# Patient Record
Sex: Female | Born: 1975 | Race: White | Hispanic: No | Marital: Married | Smoking: Former smoker
Health system: Southern US, Community
[De-identification: ages and names within clinical notes are randomized; demographics above are authoritative.]

## PROBLEM LIST (undated history)

## (undated) DIAGNOSIS — D649 Anemia, unspecified: Secondary | ICD-10-CM

## (undated) DIAGNOSIS — G8929 Other chronic pain: Secondary | ICD-10-CM

## (undated) DIAGNOSIS — E7800 Pure hypercholesterolemia, unspecified: Secondary | ICD-10-CM

## (undated) DIAGNOSIS — O4400 Placenta previa specified as without hemorrhage, unspecified trimester: Secondary | ICD-10-CM

## (undated) DIAGNOSIS — Z87442 Personal history of urinary calculi: Secondary | ICD-10-CM

## (undated) DIAGNOSIS — R519 Headache, unspecified: Secondary | ICD-10-CM

## (undated) DIAGNOSIS — K219 Gastro-esophageal reflux disease without esophagitis: Secondary | ICD-10-CM

## (undated) DIAGNOSIS — F419 Anxiety disorder, unspecified: Secondary | ICD-10-CM

## (undated) DIAGNOSIS — R51 Headache: Secondary | ICD-10-CM

## (undated) DIAGNOSIS — G43709 Chronic migraine without aura, not intractable, without status migrainosus: Secondary | ICD-10-CM

## (undated) DIAGNOSIS — M1990 Unspecified osteoarthritis, unspecified site: Secondary | ICD-10-CM

## (undated) DIAGNOSIS — F32A Depression, unspecified: Secondary | ICD-10-CM

## (undated) DIAGNOSIS — B029 Zoster without complications: Secondary | ICD-10-CM

## (undated) DIAGNOSIS — M545 Low back pain, unspecified: Secondary | ICD-10-CM

## (undated) HISTORY — PX: BACK SURGERY: SHX140

## (undated) HISTORY — DX: Pure hypercholesterolemia, unspecified: E78.00

## (undated) HISTORY — PX: ABDOMINAL HYSTERECTOMY: SHX81

## (undated) HISTORY — PX: TUBAL LIGATION: SHX77

## (undated) HISTORY — DX: Gastro-esophageal reflux disease without esophagitis: K21.9

## (undated) HISTORY — DX: Headache: R51

## (undated) HISTORY — DX: Complete placenta previa nos or without hemorrhage, unspecified trimester: O44.00

## (undated) HISTORY — DX: Anemia, unspecified: D64.9

## (undated) HISTORY — DX: Headache, unspecified: R51.9

---

## 1998-02-05 ENCOUNTER — Other Ambulatory Visit: Admission: RE | Admit: 1998-02-05 | Discharge: 1998-02-05 | Payer: Self-pay | Admitting: Obstetrics and Gynecology

## 1999-01-26 ENCOUNTER — Other Ambulatory Visit: Admission: RE | Admit: 1999-01-26 | Discharge: 1999-01-26 | Payer: Self-pay | Admitting: Obstetrics and Gynecology

## 2000-01-14 ENCOUNTER — Other Ambulatory Visit: Admission: RE | Admit: 2000-01-14 | Discharge: 2000-01-14 | Payer: Self-pay | Admitting: *Deleted

## 2001-01-27 ENCOUNTER — Other Ambulatory Visit: Admission: RE | Admit: 2001-01-27 | Discharge: 2001-01-27 | Payer: Self-pay | Admitting: Obstetrics and Gynecology

## 2001-10-24 ENCOUNTER — Inpatient Hospital Stay (HOSPITAL_COMMUNITY): Admission: AD | Admit: 2001-10-24 | Discharge: 2001-10-26 | Payer: Self-pay | Admitting: *Deleted

## 2001-10-26 ENCOUNTER — Encounter: Payer: Self-pay | Admitting: *Deleted

## 2001-11-03 ENCOUNTER — Encounter: Payer: Self-pay | Admitting: *Deleted

## 2001-11-03 ENCOUNTER — Ambulatory Visit (HOSPITAL_COMMUNITY): Admission: RE | Admit: 2001-11-03 | Discharge: 2001-11-03 | Payer: Self-pay | Admitting: *Deleted

## 2001-11-20 ENCOUNTER — Ambulatory Visit (HOSPITAL_COMMUNITY): Admission: RE | Admit: 2001-11-20 | Discharge: 2001-11-20 | Payer: Self-pay | Admitting: *Deleted

## 2001-11-21 ENCOUNTER — Inpatient Hospital Stay (HOSPITAL_COMMUNITY): Admission: AD | Admit: 2001-11-21 | Discharge: 2001-11-25 | Payer: Self-pay | Admitting: *Deleted

## 2001-12-26 ENCOUNTER — Other Ambulatory Visit: Admission: RE | Admit: 2001-12-26 | Discharge: 2001-12-26 | Payer: Self-pay | Admitting: *Deleted

## 2002-12-28 ENCOUNTER — Other Ambulatory Visit: Admission: RE | Admit: 2002-12-28 | Discharge: 2002-12-28 | Payer: Self-pay | Admitting: *Deleted

## 2003-04-06 HISTORY — PX: CHOLECYSTECTOMY: SHX55

## 2003-06-26 ENCOUNTER — Other Ambulatory Visit: Admission: RE | Admit: 2003-06-26 | Discharge: 2003-06-26 | Payer: Self-pay | Admitting: *Deleted

## 2003-07-26 ENCOUNTER — Encounter: Admission: RE | Admit: 2003-07-26 | Discharge: 2003-07-26 | Payer: Self-pay | Admitting: *Deleted

## 2003-09-19 ENCOUNTER — Encounter: Admission: RE | Admit: 2003-09-19 | Discharge: 2003-09-19 | Payer: Self-pay | Admitting: Cardiology

## 2003-09-19 ENCOUNTER — Ambulatory Visit (HOSPITAL_COMMUNITY): Admission: RE | Admit: 2003-09-19 | Discharge: 2003-09-19 | Payer: Self-pay | Admitting: *Deleted

## 2003-10-02 ENCOUNTER — Inpatient Hospital Stay (HOSPITAL_COMMUNITY): Admission: AD | Admit: 2003-10-02 | Discharge: 2003-10-02 | Payer: Self-pay | Admitting: Obstetrics and Gynecology

## 2004-01-21 ENCOUNTER — Inpatient Hospital Stay (HOSPITAL_COMMUNITY): Admission: AD | Admit: 2004-01-21 | Discharge: 2004-01-23 | Payer: Self-pay | Admitting: Obstetrics and Gynecology

## 2004-03-10 ENCOUNTER — Observation Stay: Payer: Self-pay | Admitting: General Surgery

## 2004-03-12 ENCOUNTER — Ambulatory Visit: Payer: Self-pay | Admitting: General Surgery

## 2004-03-13 ENCOUNTER — Ambulatory Visit: Payer: Self-pay | Admitting: General Surgery

## 2005-02-18 ENCOUNTER — Ambulatory Visit: Payer: Self-pay | Admitting: Internal Medicine

## 2006-03-30 ENCOUNTER — Encounter: Admission: RE | Admit: 2006-03-30 | Discharge: 2006-03-30 | Payer: Self-pay | Admitting: *Deleted

## 2007-12-21 ENCOUNTER — Emergency Department: Payer: Self-pay | Admitting: Internal Medicine

## 2008-04-23 ENCOUNTER — Observation Stay: Payer: Self-pay | Admitting: Internal Medicine

## 2008-06-28 ENCOUNTER — Observation Stay: Payer: Self-pay | Admitting: Obstetrics and Gynecology

## 2008-07-01 ENCOUNTER — Inpatient Hospital Stay: Payer: Self-pay | Admitting: Obstetrics and Gynecology

## 2011-03-23 LAB — TSH: TSH: 0.9 u[IU]/mL (ref 0.41–5.90)

## 2011-03-23 LAB — BASIC METABOLIC PANEL
Creatinine: 0.6 mg/dL (ref 0.5–1.1)
Sodium: 136 mmol/L — AB (ref 137–147)

## 2011-04-01 ENCOUNTER — Ambulatory Visit: Payer: Self-pay | Admitting: Otolaryngology

## 2011-07-08 ENCOUNTER — Ambulatory Visit: Payer: Self-pay | Admitting: Internal Medicine

## 2011-07-08 LAB — HM MAMMOGRAPHY

## 2012-07-10 ENCOUNTER — Other Ambulatory Visit: Payer: Self-pay | Admitting: Internal Medicine

## 2012-07-10 NOTE — Telephone Encounter (Signed)
Pt listed as a [facility] pt. Per Rx states to follow up with Dr. Lorin Picket.

## 2012-09-25 ENCOUNTER — Ambulatory Visit: Payer: Self-pay | Admitting: Internal Medicine

## 2012-11-28 ENCOUNTER — Encounter: Payer: Self-pay | Admitting: Internal Medicine

## 2012-11-28 ENCOUNTER — Other Ambulatory Visit (HOSPITAL_COMMUNITY)
Admission: RE | Admit: 2012-11-28 | Discharge: 2012-11-28 | Disposition: A | Discharge status: Home / Self Care | Payer: BC Managed Care – PPO | Source: Ambulatory Visit | Attending: Pediatrics | Admitting: Pediatrics

## 2012-11-28 ENCOUNTER — Ambulatory Visit (INDEPENDENT_AMBULATORY_CARE_PROVIDER_SITE_OTHER): Payer: BC Managed Care – PPO | Admitting: Internal Medicine

## 2012-11-28 VITALS — BP 110/80 | HR 85 | Temp 98.5°F | Ht 63.0 in | Wt 174.5 lb

## 2012-11-28 DIAGNOSIS — Z124 Encounter for screening for malignant neoplasm of cervix: Secondary | ICD-10-CM

## 2012-11-28 DIAGNOSIS — K219 Gastro-esophageal reflux disease without esophagitis: Secondary | ICD-10-CM

## 2012-11-28 DIAGNOSIS — Z733 Stress, not elsewhere classified: Secondary | ICD-10-CM

## 2012-11-28 DIAGNOSIS — F439 Reaction to severe stress, unspecified: Secondary | ICD-10-CM

## 2012-11-28 DIAGNOSIS — Z1151 Encounter for screening for human papillomavirus (HPV): Secondary | ICD-10-CM | POA: Insufficient documentation

## 2012-11-28 DIAGNOSIS — R51 Headache: Secondary | ICD-10-CM

## 2012-11-28 DIAGNOSIS — E7800 Pure hypercholesterolemia, unspecified: Secondary | ICD-10-CM

## 2012-11-28 DIAGNOSIS — Z01419 Encounter for gynecological examination (general) (routine) without abnormal findings: Secondary | ICD-10-CM | POA: Insufficient documentation

## 2012-11-28 MED ORDER — ESCITALOPRAM OXALATE 10 MG PO TABS: ORAL_TABLET | ORAL | Status: DC

## 2012-11-28 MED ORDER — CYCLOBENZAPRINE HCL 5 MG PO TABS: ORAL_TABLET | ORAL | Status: DC

## 2012-12-02 ENCOUNTER — Encounter: Payer: Self-pay | Admitting: Internal Medicine

## 2012-12-03 ENCOUNTER — Encounter: Payer: Self-pay | Admitting: Internal Medicine

## 2012-12-03 ENCOUNTER — Telehealth: Payer: Self-pay | Admitting: Internal Medicine

## 2012-12-03 DIAGNOSIS — E7800 Pure hypercholesterolemia, unspecified: Secondary | ICD-10-CM | POA: Insufficient documentation

## 2012-12-03 DIAGNOSIS — K219 Gastro-esophageal reflux disease without esophagitis: Secondary | ICD-10-CM | POA: Insufficient documentation

## 2012-12-03 DIAGNOSIS — R519 Headache, unspecified: Secondary | ICD-10-CM | POA: Insufficient documentation

## 2012-12-03 DIAGNOSIS — F411 Generalized anxiety disorder: Secondary | ICD-10-CM | POA: Insufficient documentation

## 2012-12-03 NOTE — Assessment & Plan Note (Signed)
On lexapro.  Discussed with her at length today.  She feels she is handling things relatively well.  Follow.

## 2012-12-03 NOTE — Assessment & Plan Note (Signed)
Low cholesterol diet and exercise.  Check lipid panel.   

## 2012-12-03 NOTE — Assessment & Plan Note (Signed)
Symptoms controlled.  Follow.   

## 2012-12-03 NOTE — Progress Notes (Signed)
Subjective:    Patient ID: Ariel Morgan, female    DOB: 1975/11/22, 37 y.o.   MRN: [id number]  HPI 37 year old female with past history of hypercholesterolemia who comes in today to follow up on this as well as for a complete physical exam.  She is also transferring her care here to [facility].  Former pt of mine at [facility].  States she is doing relatively well.  Is a Runner, broadcasting/film/video.  Just started back to school.  Increased stress related to this.  Feels she is handling things relatively well.  On Lexapro.  Does report problems with headaches (intermittently).  This has been an issue for her in the past.  Relates it to increased stress.  Eating and drinking well.  No cardiac symptoms with increased activity or exertion.  Bowels stable.     Past Medical History  Diagnosis Date  . Hypercholesterolemia   . Placenta previa   . GERD (gastroesophageal reflux disease)   . Frequent headaches     H/O  . Anemia     Outpatient Encounter Prescriptions as of 11/28/2012  Medication Sig Dispense Refill  . [DISCONTINUED] escitalopram (LEXAPRO) 10 MG tablet 1 1/2 TABLET BY MOUTH ONCE A DAY FOLLOW UP WITH DR Lorin Picket AT [place]  45 tablet  2  . cyclobenzaprine (FLEXERIL) 5 MG tablet Take one tablet q pm prn  30 tablet  0  . escitalopram (LEXAPRO) 10 MG tablet 1 1/2 tablet day  45 tablet  5  . [DISCONTINUED] butalbital-acetaminophen-caffeine (FIORICET, ESGIC) 50-325-40 MG per tablet Take 1 tablet by mouth every 6 (six) hours as needed for headache.      . [DISCONTINUED] promethazine (PHENERGAN) 25 MG tablet Take 25 mg by mouth every 6 (six) hours as needed.       No facility-administered encounter medications on file as of 11/28/2012.    Review of Systems Patient denies any lightheadedness or dizziness.  Headaches as outlined.  No chest pain, tightness or palpitations.  No increased shortness of breath, cough or congestion.  No nausea or vomiting.  No increased acid reflux.  No abdominal pain or cramping.  No  bowel change, such as diarrhea, constipation, BRBPR or melana.  No urine change.   Increased stress.  Feels she is handling things relatively well.       Objective:   Physical Exam Filed Vitals:   11/28/12 1553  BP: 110/80  Pulse: 85  Temp: 98.5 F (71.52 C)   37 year old female in no acute distress.   HEENT:  Nares- clear.  Oropharynx - without lesions. NECK:  Supple.  Nontender.  No audible bruit.  HEART:  Appears to be regular. LUNGS:  No crackles or wheezing audible.  Respirations even and unlabored.  RADIAL PULSE:  Equal bilaterally.    BREASTS:  No nipple discharge or nipple retraction present.  Could not appreciate any distinct nodules or axillary adenopathy.  ABDOMEN:  Soft, nontender.  Bowel sounds present and normal.  No audible abdominal bruit.  GU:  Normal external genitalia.  Vaginal vault without lesions.  Cervix identified.  Pap performed. Could not appreciate any adnexal masses or tenderness.   RECTAL:  Not performed.   EXTREMITIES:  No increased edema present.  DP pulses palpable and equal bilaterally.          Assessment & Plan:  HEALTH MAINTENANCE.  Physical today.  Needs baseline mammogram.  Obtain records.    I spent 25 minutes with this patient and more than 50%  of the time was spent in consultation regarding the above.

## 2012-12-03 NOTE — Assessment & Plan Note (Signed)
She relates the headaches to stress.  Has experienced some headaches in the past.  Flexeril as directed.  Follow.

## 2012-12-03 NOTE — Telephone Encounter (Signed)
Please schedule pt for a follow up appt in 6 months ( ).  Schedule fasting labs within the next 1-2 weeks.  Thanks.

## 2012-12-05 NOTE — Telephone Encounter (Signed)
Left message on home answering machine asking pt to call office

## 2012-12-06 NOTE — Telephone Encounter (Signed)
Lab appointment 9/16  Follow up with dr scott 3/3  Pt aware

## 2012-12-11 ENCOUNTER — Encounter: Payer: Self-pay | Admitting: Internal Medicine

## 2012-12-19 ENCOUNTER — Other Ambulatory Visit: Payer: BC Managed Care – PPO

## 2012-12-25 ENCOUNTER — Telehealth: Payer: Self-pay | Admitting: *Deleted

## 2012-12-25 NOTE — Telephone Encounter (Signed)
She needs to be evaluated if ongoing.  If increased for 3 days - I would have her go to acute care this pm.

## 2012-12-25 NOTE — Telephone Encounter (Signed)
Pt notified and verbalized understanding.

## 2012-12-25 NOTE — Telephone Encounter (Signed)
Pt C/O migraine since Saturday. Muscle Relaxer, Ibuprofen, & Excedrin Migraine not helping. Headache has been ongoing x 3 days. Please advise

## 2013-01-29 ENCOUNTER — Encounter: Payer: Self-pay | Admitting: Internal Medicine

## 2013-01-29 ENCOUNTER — Other Ambulatory Visit (INDEPENDENT_AMBULATORY_CARE_PROVIDER_SITE_OTHER): Payer: BC Managed Care – PPO

## 2013-01-29 DIAGNOSIS — E7800 Pure hypercholesterolemia, unspecified: Secondary | ICD-10-CM

## 2013-01-29 DIAGNOSIS — R51 Headache: Secondary | ICD-10-CM

## 2013-01-29 LAB — LIPID PANEL: Triglycerides: 114 mg/dL (ref 0.0–149.0)

## 2013-01-29 LAB — CBC WITH DIFFERENTIAL/PLATELET
Basophils Relative: 1.2 % (ref 0.0–3.0)
HCT: 39 % (ref 36.0–46.0)
Hemoglobin: 13.3 g/dL (ref 12.0–15.0)
Lymphocytes Relative: 23.5 % (ref 12.0–46.0)
MCV: 88.9 fl (ref 78.0–100.0)
Neutrophils Relative %: 68.3 % (ref 43.0–77.0)
Platelets: 328 10*3/uL (ref 150.0–400.0)
RBC: 4.39 Mil/uL (ref 3.87–5.11)
WBC: 7.7 10*3/uL (ref 4.5–10.5)

## 2013-01-29 LAB — COMPREHENSIVE METABOLIC PANEL
ALT: 31 U/L (ref 0–35)
AST: 28 U/L (ref 0–37)
BUN: 9 mg/dL (ref 6–23)
CO2: 24 mEq/L (ref 19–32)
Creatinine, Ser: 0.8 mg/dL (ref 0.4–1.2)
GFR: 92.4 mL/min (ref >60.00)

## 2013-01-29 LAB — LDL CHOLESTEROL, DIRECT: Direct LDL: 132.6 mg/dL

## 2013-02-08 ENCOUNTER — Other Ambulatory Visit: Payer: Self-pay

## 2013-03-16 ENCOUNTER — Telehealth: Payer: Self-pay | Admitting: *Deleted

## 2013-03-16 NOTE — Telephone Encounter (Signed)
She will need to be seen.  I can see her on 03/22/13 at 4:15.

## 2013-03-16 NOTE — Telephone Encounter (Signed)
Appointment made pt aware

## 2013-03-16 NOTE — Telephone Encounter (Signed)
Was seen at [facility] walk-in about 1 month ago & given caffeine pills for her migraines/headaches & worked very well. Wants to know if you would send in a RX. Pt states that they seem to work better than the muscle relaxer. Pt also mentioned that she has had several deaths in her family recently & wanted to know if she could have a Rx for Xanax. I informed patient that she would probably need to be seen first. Please advise.

## 2013-03-22 ENCOUNTER — Ambulatory Visit: Payer: Self-pay | Admitting: Internal Medicine

## 2013-03-22 ENCOUNTER — Ambulatory Visit (INDEPENDENT_AMBULATORY_CARE_PROVIDER_SITE_OTHER): Payer: BC Managed Care – PPO | Admitting: Internal Medicine

## 2013-03-22 ENCOUNTER — Encounter: Payer: Self-pay | Admitting: Internal Medicine

## 2013-03-22 VITALS — BP 100/62 | HR 80 | Temp 98.1°F | Resp 12 | Ht 63.0 in | Wt 176.5 lb

## 2013-03-22 DIAGNOSIS — F439 Reaction to severe stress, unspecified: Secondary | ICD-10-CM

## 2013-03-22 DIAGNOSIS — M79671 Pain in right foot: Secondary | ICD-10-CM

## 2013-03-22 DIAGNOSIS — Z733 Stress, not elsewhere classified: Secondary | ICD-10-CM

## 2013-03-22 DIAGNOSIS — R51 Headache: Secondary | ICD-10-CM

## 2013-03-22 DIAGNOSIS — M79609 Pain in unspecified limb: Secondary | ICD-10-CM

## 2013-03-22 DIAGNOSIS — K219 Gastro-esophageal reflux disease without esophagitis: Secondary | ICD-10-CM

## 2013-03-22 MED ORDER — ETODOLAC 400 MG PO TABS: 400.0000 mg | ORAL_TABLET | Freq: Two times a day (BID) | ORAL | Status: DC | PRN

## 2013-03-22 MED ORDER — ALPRAZOLAM 0.25 MG PO TABS: 0.2500 mg | ORAL_TABLET | Freq: Every day | ORAL | Status: DC | PRN

## 2013-03-22 MED ORDER — BUTALBITAL-APAP-CAFFEINE 50-325-40 MG PO TABS: 1.0000 | ORAL_TABLET | Freq: Two times a day (BID) | ORAL | Status: DC | PRN

## 2013-03-22 MED ORDER — ESCITALOPRAM OXALATE 20 MG PO TABS: 20.0000 mg | ORAL_TABLET | Freq: Every day | ORAL | Status: DC

## 2013-03-22 NOTE — Progress Notes (Signed)
Pre visit review using our clinic review tool, if applicable. No additional management support is needed unless otherwise documented below in the visit note. 

## 2013-03-23 ENCOUNTER — Telehealth: Payer: Self-pay | Admitting: *Deleted

## 2013-03-23 ENCOUNTER — Encounter: Payer: Self-pay | Admitting: *Deleted

## 2013-03-23 DIAGNOSIS — M79673 Pain in unspecified foot: Secondary | ICD-10-CM

## 2013-03-23 NOTE — Telephone Encounter (Signed)
Sent mychart message with foot x-ray results: Your foot x-ray was negative. Dr. Lorin Picket would like for you to remain from bearing weight on that foot as much as possible. Recommends ankle/foot support. Given persistent pain/swelling, Dr. Lorin Picket would like to refer you to podiatry. If you are agreeable, please let me know so that she can place the order.    Pt notified & would like to proceed with Podiatry referral (copy of report placed in [facility] box)

## 2013-03-23 NOTE — Telephone Encounter (Signed)
Order placed for podiatry referral.   

## 2013-03-25 ENCOUNTER — Encounter: Payer: Self-pay | Admitting: Internal Medicine

## 2013-03-25 DIAGNOSIS — M79671 Pain in right foot: Secondary | ICD-10-CM | POA: Insufficient documentation

## 2013-03-25 NOTE — Assessment & Plan Note (Addendum)
On lexapro.  Discussed with her at length today.  Increased stress. Her grandmother passed away 2013-02-02 and her grandfather passed away 21-Feb-2013.   She feels is contributing to her headaches.  Xanax relieved.  Will increase lexapro to 20mg  q day.  Xanax rx given to have if needed.  Follow.  Get her back in soon to reassess.

## 2013-03-25 NOTE — Assessment & Plan Note (Signed)
She relates the headaches to stress.  Has experienced some headaches in the past.  Request a refill on fioricet.  Refilled.  Will treat the stress as outlined.  Follow closely.  If persistent headaches, will require further evaluation and testing (including possible repeat scanning).  She desires to hold off on this at this time.  Follow.  Knows if any worsening change or problems she is to be reevaluated.

## 2013-03-25 NOTE — Assessment & Plan Note (Signed)
Symptoms controlled.  Follow.   

## 2013-03-25 NOTE — Assessment & Plan Note (Signed)
Persistent pain and swelling in her right foot s/p injury.  Will xray.  Continue support.  Remain non weight bearing as much as possible.  Further w/up pending results of xray.  May need podiatry referral.  Lodine for pain.  Follow GI side effects.

## 2013-03-25 NOTE — Progress Notes (Signed)
  Subjective:    Patient ID: Ariel Morgan, female    DOB: [birth year], 36 y.o.   MRN: [id number]  Headache   Foot Pain Associated symptoms include headaches.   37 year old female with past history of hypercholesterolemia who comes in today as a work in with concerns regarding persistent headaches.  Feels related to stress.  Took a xanax on one occasion and the headache immediately resolved.  She is on Lexapro.  Discussed increasing the dose.  She has previously had fioricet for her headaches.  Just took prn.  Felt this worked better than the muscle relaxer.   Eating and drinking well.  No cardiac symptoms with increased activity or exertion.  Bowels stable.  She also reports increased right foot pain and swelling s/p injury.  Larey Seat down her step three days ago.  Has been wearing a laced ankle support.  Persistent pain.  States her foot "twisted both ways".     Past Medical History  Diagnosis Date  . Hypercholesterolemia   . Placenta previa   . GERD (gastroesophageal reflux disease)   . Frequent headaches     H/O  . Anemia     Outpatient Encounter Prescriptions as of 03/22/2013  Medication Sig  . magnesium oxide (MAG-OX) 400 MG tablet Take 400 mg by mouth daily.  . [DISCONTINUED] escitalopram (LEXAPRO) 10 MG tablet 1 1/2 tablet day  . ALPRAZolam (XANAX) 0.25 MG tablet Take 1 tablet (0.25 mg total) by mouth daily as needed for anxiety.  . butalbital-acetaminophen-caffeine (FIORICET) 50-325-40 MG per tablet Take 1 tablet by mouth 2 (two) times daily as needed for headache.  . cyclobenzaprine (FLEXERIL) 5 MG tablet Take one tablet q pm prn  . escitalopram (LEXAPRO) 20 MG tablet Take 1 tablet (20 mg total) by mouth daily.  Marland Kitchen etodolac (LODINE) 400 MG tablet Take 1 tablet (400 mg total) by mouth 2 (two) times daily as needed. Take with food    Review of Systems  Neurological: Positive for headaches.  Patient denies any lightheadedness or dizziness.  Headaches as outlined.  No chest  pain, tightness or palpitations.  No increased shortness of breath, cough or congestion.  No nausea or vomiting.  No increased acid reflux.  No abdominal pain or cramping.  No bowel change, such as diarrhea, constipation, BRBPR or melana.  No urine change.   Increased stress.  Feels she is handling things relatively well.       Objective:   Physical Exam  Filed Vitals:   03/22/13 1630  BP: 100/62  Pulse: 80  Temp: 98.1 F (36.7 C)  Resp: 12   Blood pressure recheck:  34/60  37 year old female in no acute distress.   HEENT:  Nares- clear.  Oropharynx - without lesions. Eyes:  PERRL.  Fundi visualized appeared to be benign.  NECK:  Supple.  Nontender.  No audible bruit.  HEART:  Appears to be regular. LUNGS:  No crackles or wheezing audible.  Respirations even and unlabored.  RADIAL PULSE:  Equal bilaterally.     ABDOMEN:  Soft, nontender.  Bowel sounds present and normal.  No audible abdominal bruit.   EXTREMITIES:  No increased edema present.  DP pulses palpable and equal bilaterally.          Assessment & Plan:  HEALTH MAINTENANCE.  Physical 11/28/12.     I spent 25 minutes with this patient and more than 50% of the time was spent in consultation regarding the above.

## 2013-03-27 ENCOUNTER — Encounter: Payer: Self-pay | Admitting: Podiatry

## 2013-03-27 ENCOUNTER — Ambulatory Visit (INDEPENDENT_AMBULATORY_CARE_PROVIDER_SITE_OTHER): Payer: BC Managed Care – PPO

## 2013-03-27 ENCOUNTER — Encounter: Payer: Self-pay | Admitting: Internal Medicine

## 2013-03-27 ENCOUNTER — Ambulatory Visit (INDEPENDENT_AMBULATORY_CARE_PROVIDER_SITE_OTHER): Payer: BC Managed Care – PPO | Admitting: Podiatry

## 2013-03-27 VITALS — BP 102/64 | HR 78 | Resp 16 | Ht 63.0 in | Wt 174.0 lb

## 2013-03-27 DIAGNOSIS — M79671 Pain in right foot: Secondary | ICD-10-CM

## 2013-03-27 DIAGNOSIS — M79609 Pain in unspecified limb: Secondary | ICD-10-CM

## 2013-03-27 DIAGNOSIS — S93409A Sprain of unspecified ligament of unspecified ankle, initial encounter: Secondary | ICD-10-CM

## 2013-03-27 DIAGNOSIS — M7750 Other enthesopathy of unspecified foot: Secondary | ICD-10-CM

## 2013-03-27 MED ORDER — TRIAMCINOLONE ACETONIDE 10 MG/ML IJ SUSP
10.0000 mg | Freq: Once | INTRAMUSCULAR | Status: AC
  Administered 2013-03-27: 10 mg

## 2013-03-27 NOTE — Telephone Encounter (Signed)
Called pt and discussed with her regarding her spotting.  Minimal.   Will follow.  Had cortisone injection today.  May be able to get off antiinflammatory soon.  Will keep me posted.

## 2013-03-27 NOTE — Progress Notes (Signed)
   Subjective:    Patient ID: Ariel Morgan, female    DOB: June 24, 1975, 37 y.o.   MRN: [id number]  HPI Comments: Rolled foot about a week ago Monday  N painful , swollen  L right lateral side of foot  D rolled foot 1 week ago  Oall of a sudden  C about the same  A walk for long period of time it starts to hurt  T xray, pain meds    Foot Pain      Review of Systems  All other systems reviewed and are negative.       Objective:   Physical Exam        Assessment & Plan:

## 2013-03-27 NOTE — Progress Notes (Signed)
Subjective:     Patient ID: Ariel Morgan, female   DOB: 1976/01/01, 37 y.o.   MRN: [id number]  Foot Pain   patient presents stating she rolled her foot several weeks ago and has been very tender when pressed on the outside and it no longer hurts in the ankle joint but more distal and she feels like it is not supported properly. Didn't have x-rays done which did not indicate fracture at the time   Review of Systems  All other systems reviewed and are negative.       Objective:   Physical Exam  Nursing note and vitals reviewed. Constitutional: She is oriented to person, place, and time.  Cardiovascular: Intact distal pulses.   Musculoskeletal: Normal range of motion.  Neurological: She is oriented to person, place, and time.  Skin: Skin is warm.   neurovascular status intact with muscle strength adequate and some splinting when I inverted and everted the ankle joint but no indications of ligamentous tear or laxity. Pain distal to the ankle on the lateral side of the foot with inflammation in this area noted     Assessment:     Mild instability of the right ankles secondary to sprain and distal tendinitis secondary to injury    Plan:     H&P reviewed with patient as were x-rays. I did a careful distal injection 3 mg Kenalog 5 mm Xylocaine Marcaine mixture and dispensed a Tri-Lock ankle brace to stabilize the ankle. Reappoint if symptoms do not get better in the next several weeks

## 2013-04-15 ENCOUNTER — Other Ambulatory Visit: Payer: Self-pay | Admitting: Internal Medicine

## 2013-04-18 ENCOUNTER — Other Ambulatory Visit: Payer: Self-pay | Admitting: Internal Medicine

## 2013-04-19 ENCOUNTER — Other Ambulatory Visit: Payer: Self-pay | Admitting: *Deleted

## 2013-04-19 MED ORDER — ALPRAZOLAM 0.25 MG PO TABS: 0.2500 mg | ORAL_TABLET | Freq: Every day | ORAL | Status: DC | PRN

## 2013-04-19 MED ORDER — ETODOLAC 400 MG PO TABS: 400.0000 mg | ORAL_TABLET | Freq: Two times a day (BID) | ORAL | Status: DC | PRN

## 2013-04-19 MED ORDER — CYCLOBENZAPRINE HCL 5 MG PO TABS: ORAL_TABLET | ORAL | Status: DC

## 2013-04-19 NOTE — Telephone Encounter (Signed)
Pt sent myChart message, requesting refills

## 2013-04-19 NOTE — Telephone Encounter (Signed)
Refilled flexeril and alprazolam x 1.

## 2013-05-07 ENCOUNTER — Encounter: Payer: Self-pay | Admitting: Internal Medicine

## 2013-05-08 ENCOUNTER — Telehealth: Payer: Self-pay | Admitting: Internal Medicine

## 2013-05-08 NOTE — Telephone Encounter (Signed)
See below my chart message  have a strange rash on my face and neck. My glands under my ears are swollen and I feel a small lump Please let me know date and time i can put patient She would like afternoon appointment

## 2013-05-08 NOTE — Telephone Encounter (Signed)
Left message for pt to call office please let her know about appointment Thursday 4:15 with dr Nicki Reaper    Also sent my chart message letting pt know about appointment

## 2013-05-08 NOTE — Telephone Encounter (Signed)
Please call pt and let her know that I have a meeting tomorrow and will not be in the office.  I can see her Thursday pm (can come at 4:15 - may have to wait).  Can work in for this.  If does not want to wait - can see if she wants to see Raquel tomorrow.

## 2013-05-09 ENCOUNTER — Encounter: Payer: Self-pay | Admitting: *Deleted

## 2013-05-10 ENCOUNTER — Ambulatory Visit (INDEPENDENT_AMBULATORY_CARE_PROVIDER_SITE_OTHER): Payer: BC Managed Care – PPO | Admitting: Adult Health

## 2013-05-10 ENCOUNTER — Encounter: Payer: Self-pay | Admitting: Internal Medicine

## 2013-05-10 ENCOUNTER — Ambulatory Visit: Payer: BC Managed Care – PPO | Admitting: Internal Medicine

## 2013-05-10 VITALS — BP 120/78 | HR 85 | Temp 98.2°F | Resp 16 | Ht 63.0 in | Wt 177.8 lb

## 2013-05-10 DIAGNOSIS — R21 Rash and other nonspecific skin eruption: Secondary | ICD-10-CM

## 2013-05-10 DIAGNOSIS — R238 Other skin changes: Secondary | ICD-10-CM

## 2013-05-10 DIAGNOSIS — J329 Chronic sinusitis, unspecified: Secondary | ICD-10-CM

## 2013-05-10 MED ORDER — VALACYCLOVIR HCL 1 G PO TABS: 1000.0000 mg | ORAL_TABLET | Freq: Three times a day (TID) | ORAL | Status: DC

## 2013-05-10 MED ORDER — AZITHROMYCIN 250 MG PO TABS: ORAL_TABLET | ORAL | Status: DC

## 2013-05-10 NOTE — Progress Notes (Signed)
Pre-visit discussion using our clinic review tool. No additional management support is needed unless otherwise documented below in the visit note.  

## 2013-05-10 NOTE — Patient Instructions (Signed)
  You have shingles. You will need to be seen by your Eye Doctor as soon as possible.  Start Valtrex 1000 mg every 8 hours for 7 days.  I am also starting you on Azithromycin for your sinus symptoms.  Please call if your symptoms worsen or do not improve.

## 2013-05-10 NOTE — Progress Notes (Signed)
Patient ID: Ariel Morgan, female   DOB: 25-Sep-1975, 38 y.o.   MRN: [id number]    Subjective:    Patient ID: Ariel Morgan, female    DOB: November 24, 1975, 38 y.o.   MRN: [id number]  HPI  Patient presents to clinic with a "strange rash on her face". The rash started on Sunday. It is painful. The rash is on the left side of face. Started on the left side of her forehead, developed a cluster on the temporal area and followed along the hairline, in front of her ear and then on her neck. She has been applying neosporin to the area. She also reports swollen lymph nodes on the left. She is having sinus symptoms of sinus pressure, congestion, drainage. Denies fever, chills. No visual disturbances.   Past Medical History  Diagnosis Date  . Hypercholesterolemia   . Placenta previa   . GERD (gastroesophageal reflux disease)   . Frequent headaches     H/O  . Anemia     Current Outpatient Prescriptions on File Prior to Visit  Medication Sig Dispense Refill  . ALPRAZolam (XANAX) 0.25 MG tablet Take 1 tablet (0.25 mg total) by mouth daily as needed for anxiety.  20 tablet  0  . ALPRAZolam (XANAX) 0.25 MG tablet Take 1 tablet (0.25 mg total) by mouth daily as needed for anxiety.  20 tablet  0  . butalbital-acetaminophen-caffeine (FIORICET) 50-325-40 MG per tablet Take 1 tablet by mouth 2 (two) times daily as needed for headache.  20 tablet  0  . cyclobenzaprine (FLEXERIL) 5 MG tablet Take one tablet q pm prn  30 tablet  0  . cyclobenzaprine (FLEXERIL) 5 MG tablet Take one tablet q pm prn  30 tablet  1  . escitalopram (LEXAPRO) 20 MG tablet Take 1 tablet (20 mg total) by mouth daily.  30 tablet  2  . etodolac (LODINE) 400 MG tablet Take 1 tablet (400 mg total) by mouth 2 (two) times daily as needed. Take with food  20 tablet  0  . magnesium oxide (MAG-OX) 400 MG tablet Take 400 mg by mouth daily.       No current facility-administered medications on file prior to visit.     Review of Systems    Constitutional: Negative for fever and chills.  HENT: Positive for congestion, postnasal drip, rhinorrhea, sinus pressure and sneezing. Negative for sore throat.   Eyes: Negative for visual disturbance.  Respiratory: Negative.   Musculoskeletal:       Swollen and painful right hand s/p injury  Skin: Positive for rash.  Neurological: Positive for headaches.       Objective:  BP 120/78  Pulse 85  Temp(Src) 98.2 F (36.8 C) (Oral)  Ht 5\' 3"  (1.6 m)  Wt 177 lb 12 oz (80.627 kg)  BMI 31.49 kg/m2  SpO2 96%   Physical Exam  Constitutional: She is oriented to person, place, and time. She appears well-developed and well-nourished. No distress.  HENT:  Head: Normocephalic and atraumatic.  Right Ear: External ear normal.  Left Ear: External ear normal.  Eyes: Conjunctivae and EOM are normal. Pupils are equal, round, and reactive to light. Right eye exhibits no discharge. Left eye exhibits no discharge.  Cardiovascular: Normal rate and regular rhythm.   Pulmonary/Chest: Effort normal. No respiratory distress.  Musculoskeletal: Normal range of motion.  Lymphadenopathy:    She has cervical adenopathy.  Neurological: She is alert and oriented to person, place, and time.  Skin: Rash noted.  Vesicular  rash beginning on the left side of forehead, forming a cluster on the temporal area and following along the hairline to her neck.  Psychiatric: She has a normal mood and affect. Her behavior is normal. Judgment and thought content normal.          Assessment & Plan:    1. Vesicular rash Shingles. Start Valtrex 1000 mg every 8 hours x 7 days. Calamine lotion to the area. Eye Exam at her Ophthalmologist today at 2:15 pm.   2. Sinusitis Start Azithromycin. RTC if no improvement in symptoms within 4-5 days or sooner if necessary

## 2013-05-11 ENCOUNTER — Ambulatory Visit: Payer: BC Managed Care – PPO | Admitting: Internal Medicine

## 2013-05-15 ENCOUNTER — Other Ambulatory Visit: Payer: Self-pay | Admitting: Internal Medicine

## 2013-05-16 MED ORDER — ALPRAZOLAM 0.25 MG PO TABS: 0.2500 mg | ORAL_TABLET | Freq: Every day | ORAL | Status: DC | PRN

## 2013-05-16 NOTE — Telephone Encounter (Signed)
Refilled xanax #20 with no refills.

## 2013-06-05 ENCOUNTER — Encounter: Payer: Self-pay | Admitting: Internal Medicine

## 2013-06-05 ENCOUNTER — Ambulatory Visit (INDEPENDENT_AMBULATORY_CARE_PROVIDER_SITE_OTHER): Payer: BC Managed Care – PPO | Admitting: Internal Medicine

## 2013-06-05 VITALS — BP 106/66 | HR 73 | Temp 98.3°F | Resp 16 | Ht 63.0 in | Wt 178.5 lb

## 2013-06-05 DIAGNOSIS — Z733 Stress, not elsewhere classified: Secondary | ICD-10-CM

## 2013-06-05 DIAGNOSIS — E7800 Pure hypercholesterolemia, unspecified: Secondary | ICD-10-CM

## 2013-06-05 DIAGNOSIS — M79609 Pain in unspecified limb: Secondary | ICD-10-CM

## 2013-06-05 DIAGNOSIS — F439 Reaction to severe stress, unspecified: Secondary | ICD-10-CM

## 2013-06-05 DIAGNOSIS — M79671 Pain in right foot: Secondary | ICD-10-CM

## 2013-06-05 DIAGNOSIS — K219 Gastro-esophageal reflux disease without esophagitis: Secondary | ICD-10-CM

## 2013-06-05 DIAGNOSIS — R51 Headache: Secondary | ICD-10-CM

## 2013-06-05 MED ORDER — TRAZODONE HCL 50 MG PO TABS: 25.0000 mg | ORAL_TABLET | Freq: Every evening | ORAL | Status: DC | PRN

## 2013-06-05 MED ORDER — ALPRAZOLAM 0.25 MG PO TABS: 0.2500 mg | ORAL_TABLET | Freq: Every day | ORAL | Status: DC | PRN

## 2013-06-05 NOTE — Progress Notes (Signed)
Pre visit review using our clinic review tool, if applicable. No additional management support is needed unless otherwise documented below in the visit note. 

## 2013-06-09 ENCOUNTER — Encounter: Payer: Self-pay | Admitting: Internal Medicine

## 2013-06-09 NOTE — Assessment & Plan Note (Signed)
Saw Dr Paulla Dolly.  S/p injection.  Foot is better.  Follow.

## 2013-06-09 NOTE — Progress Notes (Signed)
Subjective:    Patient ID: Ariel Morgan, female    DOB: 10-07-1975, 38 y.o.   MRN: [id number]  HPI 38 year old female with past history of hypercholesterolemia who comes in today for a scheduled follow up.  States she is doing relatively well.  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  See my previous note for details.  Feels she is handling things relatively well.  On Lexapro.  Increased dose last visit.  Doing better.  Recently diagnosed with shingles.  Better.  Still with some hypersensitivity.  No eye involvement.  Eating and drinking well.     Past Medical History  Diagnosis Date  . Hypercholesterolemia   . Placenta previa   . GERD (gastroesophageal reflux disease)   . Frequent headaches     H/O  . Anemia     Outpatient Encounter Prescriptions as of 06/05/2013  Medication Sig  . ALPRAZolam (XANAX) 0.25 MG tablet Take 1 tablet (0.25 mg total) by mouth daily as needed for anxiety.  Marland Kitchen escitalopram (LEXAPRO) 20 MG tablet Take 1 tablet (20 mg total) by mouth daily.  . magnesium oxide (MAG-OX) 400 MG tablet Take 400 mg by mouth daily.  . [DISCONTINUED] ALPRAZolam (XANAX) 0.25 MG tablet Take 1 tablet (0.25 mg total) by mouth daily as needed for anxiety.  . [DISCONTINUED] butalbital-acetaminophen-caffeine (FIORICET) 50-325-40 MG per tablet Take 1 tablet by mouth 2 (two) times daily as needed for headache.  . [DISCONTINUED] cyclobenzaprine (FLEXERIL) 5 MG tablet Take one tablet q pm prn  . traZODone (DESYREL) 50 MG tablet Take 0.5-1 tablets (25-50 mg total) by mouth at bedtime as needed for sleep.  . [DISCONTINUED] ALPRAZolam (XANAX) 0.25 MG tablet Take 1 tablet (0.25 mg total) by mouth daily as needed for anxiety.  . [DISCONTINUED] azithromycin (ZITHROMAX) 250 MG tablet Take 2 tablets today then take 1 tablet daily for the next 4 days.  . [DISCONTINUED] cyclobenzaprine (FLEXERIL) 5 MG tablet Take one tablet q pm prn  . [DISCONTINUED] etodolac (LODINE) 400 MG tablet Take 1  tablet (400 mg total) by mouth 2 (two) times daily as needed. Take with food  . [DISCONTINUED] valACYclovir (VALTREX) 1000 MG tablet Take 1 tablet (1,000 mg total) by mouth 3 (three) times daily.    Review of Systems Patient denies any lightheadedness or dizziness.  Headaches better.   No chest pain, tightness or palpitations.  No increased shortness of breath, cough or congestion.  No nausea or vomiting.  No increased acid reflux.  No abdominal pain or cramping.  No bowel change, such as diarrhea, constipation, BRBPR or melana.  No urine change.   Increased stress.  Feels she is handling things relatively well.  Foot better.  S/p injection.       Objective:   Physical Exam  Filed Vitals:   06/05/13 1525  BP: 106/66  Pulse: 73  Temp: 98.3 F (36.8 C)  Resp: 41   38 year old female in no acute distress.   HEENT:  Nares- clear.  Oropharynx - without lesions. NECK:  Supple.  Nontender.  No audible bruit.  HEART:  Appears to be regular. LUNGS:  No crackles or wheezing audible.  Respirations even and unlabored.  RADIAL PULSE:  Equal bilaterally.  ABDOMEN:  Soft, nontender.  Bowel sounds present and normal.  No audible abdominal bruit.  EXTREMITIES:  No increased edema present.  DP pulses palpable and equal bilaterally.          Assessment & Plan:  HEALTH MAINTENANCE.  Physical 11/28/12..  Pap at physical negative with negative HPV.    Marland Kitchen

## 2013-06-09 NOTE — Assessment & Plan Note (Signed)
Symptoms controlled.  Follow.   

## 2013-06-09 NOTE — Assessment & Plan Note (Signed)
On lexapro.  Increased stress. Her grandmother passed away February 01, 2013 and her grandfather passed away 02/20/13.   Still trouble sleeping.  Will start trazodone.  Off flexeril.  Titrate trazodone as needed.  Follow closely.

## 2013-06-09 NOTE — Assessment & Plan Note (Signed)
Low cholesterol diet and exercise.  Follow lipid panel.   

## 2013-06-09 NOTE — Assessment & Plan Note (Signed)
Better.  Follow.  

## 2013-06-14 ENCOUNTER — Encounter: Payer: Self-pay | Admitting: Internal Medicine

## 2013-06-14 DIAGNOSIS — R51 Headache: Secondary | ICD-10-CM

## 2013-06-15 NOTE — Telephone Encounter (Signed)
Order placed for neurology referral.   

## 2013-06-19 ENCOUNTER — Encounter: Payer: Self-pay | Admitting: Emergency Medicine

## 2013-06-19 MED ORDER — PENCICLOVIR 1 % EX CREA: 1.0000 "application " | TOPICAL_CREAM | Freq: Four times a day (QID) | CUTANEOUS | Status: DC

## 2013-06-19 NOTE — Addendum Note (Signed)
Addended by: Alisa Graff on: 06/19/2013 01:14 PM   Modules accepted: Orders

## 2013-06-19 NOTE — Telephone Encounter (Signed)
rx sent in for denavir 

## 2013-07-11 ENCOUNTER — Other Ambulatory Visit: Payer: Self-pay | Admitting: Internal Medicine

## 2013-07-11 MED ORDER — ALPRAZOLAM 0.25 MG PO TABS: 0.2500 mg | ORAL_TABLET | Freq: Every day | ORAL | Status: DC | PRN

## 2013-07-11 NOTE — Telephone Encounter (Signed)
Refilled xanax #30 with no refills.

## 2013-07-31 ENCOUNTER — Other Ambulatory Visit: Payer: Self-pay | Admitting: Internal Medicine

## 2013-08-10 ENCOUNTER — Ambulatory Visit: Payer: BC Managed Care – PPO | Admitting: Internal Medicine

## 2013-08-14 ENCOUNTER — Ambulatory Visit: Payer: BC Managed Care – PPO | Admitting: Internal Medicine

## 2013-08-15 ENCOUNTER — Telehealth: Payer: Self-pay | Admitting: Internal Medicine

## 2013-08-15 NOTE — Telephone Encounter (Signed)
My chart message sent to confirm meds taking.

## 2013-08-21 NOTE — Telephone Encounter (Signed)
Unread mychart message mailed to patient 

## 2013-09-19 ENCOUNTER — Encounter: Payer: Self-pay | Admitting: Internal Medicine

## 2013-10-08 ENCOUNTER — Ambulatory Visit: Payer: BC Managed Care – PPO | Admitting: Internal Medicine

## 2013-10-25 ENCOUNTER — Other Ambulatory Visit: Payer: Self-pay | Admitting: Internal Medicine

## 2013-10-26 ENCOUNTER — Other Ambulatory Visit: Payer: Self-pay | Admitting: Internal Medicine

## 2013-12-20 ENCOUNTER — Ambulatory Visit: Payer: BC Managed Care – PPO | Admitting: Internal Medicine

## 2013-12-24 ENCOUNTER — Ambulatory Visit (INDEPENDENT_AMBULATORY_CARE_PROVIDER_SITE_OTHER): Payer: BC Managed Care – PPO | Admitting: Internal Medicine

## 2013-12-24 ENCOUNTER — Encounter: Payer: Self-pay | Admitting: Internal Medicine

## 2013-12-24 VITALS — BP 100/60 | HR 67 | Temp 98.3°F | Ht 63.0 in | Wt 166.2 lb

## 2013-12-24 DIAGNOSIS — Z23 Encounter for immunization: Secondary | ICD-10-CM

## 2013-12-24 DIAGNOSIS — Z733 Stress, not elsewhere classified: Secondary | ICD-10-CM

## 2013-12-24 DIAGNOSIS — E7800 Pure hypercholesterolemia, unspecified: Secondary | ICD-10-CM

## 2013-12-24 DIAGNOSIS — F439 Reaction to severe stress, unspecified: Secondary | ICD-10-CM

## 2013-12-24 DIAGNOSIS — K219 Gastro-esophageal reflux disease without esophagitis: Secondary | ICD-10-CM

## 2013-12-24 DIAGNOSIS — R51 Headache: Secondary | ICD-10-CM

## 2013-12-24 DIAGNOSIS — J029 Acute pharyngitis, unspecified: Secondary | ICD-10-CM

## 2013-12-24 NOTE — Patient Instructions (Signed)
Saline nasal spray - flush nose at least 2-3x/day  Nasacort - 2 sprays each nostril one time per day.  Do this in the evening.    Robitussin or Robitussin DM in the evening.

## 2013-12-24 NOTE — Progress Notes (Signed)
Pre visit review using our clinic review tool, if applicable. No additional management support is needed unless otherwise documented below in the visit note. 

## 2013-12-30 ENCOUNTER — Encounter: Payer: Self-pay | Admitting: Internal Medicine

## 2013-12-30 DIAGNOSIS — J029 Acute pharyngitis, unspecified: Secondary | ICD-10-CM | POA: Insufficient documentation

## 2013-12-30 NOTE — Assessment & Plan Note (Signed)
Noticed sore throat and sneezing - started yesterday.  Discussed nasacort, saline nasal spray.  Robitussin if needed.  Follow.  Do not feel abx warranted.

## 2013-12-30 NOTE — Assessment & Plan Note (Signed)
Better.  Follow.  Seeing neurology.  On topamax.

## 2013-12-30 NOTE — Assessment & Plan Note (Signed)
On lexapro.  Increased stress. Her grandmother passed away January 18, 2013 and her grandfather passed away 02-06-2013.   She is doing better now.  Not taking trazodone.  Follow.

## 2013-12-30 NOTE — Progress Notes (Signed)
  Subjective:    Patient ID: Ariel Morgan, female    DOB: [birth year], 38 y.o.   MRN: [id number]  HPI 38 year old female with past history of hypercholesterolemia who comes in today for a scheduled follow up.  States she is doing relatively well.  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  See my previous note for details.  Feels she is handling things relatively well.  On Lexapro.  Doing better than last visit.  Eating and drinking well.  Has adjusted her diet.  Has lost weight.  She has noticed some sore throat and some sneezing.  Started yesterday.  Discussed treatment.  Overall better.     Past Medical History  Diagnosis Date  . Hypercholesterolemia   . Placenta previa   . GERD (gastroesophageal reflux disease)   . Frequent headaches     H/O  . Anemia     Outpatient Encounter Prescriptions as of 12/24/2013  Medication Sig  . escitalopram (LEXAPRO) 20 MG tablet TAKE 1 TABLET (20 MG TOTAL) BY MOUTH DAILY.  Marland Kitchen penciclovir (DENAVIR) 1 % cream Apply 1 application topically 4 (four) times daily.  . SUMAtriptan Succinate (IMITREX PO) Take by mouth as needed.  . topiramate (TOPAMAX) 50 MG tablet Take 50 mg by mouth 2 (two) times daily.  . [DISCONTINUED] magnesium oxide (MAG-OX) 400 MG tablet Take 400 mg by mouth daily.  Marland Kitchen ALPRAZolam (XANAX) 0.25 MG tablet Take 1 tablet (0.25 mg total) by mouth daily as needed for anxiety.  . [DISCONTINUED] escitalopram (LEXAPRO) 20 MG tablet TAKE 1 TABLET (20 MG TOTAL) BY MOUTH DAILY.  . [DISCONTINUED] traZODone (DESYREL) 50 MG tablet Take 0.5-1 tablets (25-50 mg total) by mouth at bedtime as needed for sleep.    Review of Systems Patient denies any lightheadedness or dizziness.  Headaches better.   No chest pain, tightness or palpitations.  No increased shortness of breath, cough or congestion.  Does report some sneezing and sore throat.  No nausea or vomiting.  No increased acid reflux.  No abdominal pain or cramping.  No bowel change, such  as diarrhea, constipation, BRBPR or melana.  No urine change.   Increased stress.  Feels she is handling things relatively well.      Objective:   Physical Exam  Filed Vitals:   12/24/13 1602  BP: 100/60  Pulse: 67  Temp: 98.3 F (74.77 C)   38 year old female in no acute distress.   HEENT:  Nares- clear.  Oropharynx - without lesions.  No significant tenderness to palpation over the sinuses.   NECK:  Supple.  Nontender.  No audible bruit.  HEART:  Appears to be regular. LUNGS:  No crackles or wheezing audible.  Respirations even and unlabored.  RADIAL PULSE:  Equal bilaterally.  ABDOMEN:  Soft, nontender.  Bowel sounds present and normal.  No audible abdominal bruit.  EXTREMITIES:  No increased edema present.  DP pulses palpable and equal bilaterally.          Assessment & Plan:  DESIRE FOR WEIGHT LOSS.  Has adjusted her diet.  Is exercising.  Has lost weight.  Follow.   HEALTH MAINTENANCE.  Physical 11/28/12..  Pap at physical negative with negative HPV.    .  Mammogram 07/08/11 - Birads II.

## 2013-12-30 NOTE — Assessment & Plan Note (Signed)
Low cholesterol diet and exercise.  Follow lipid panel.   

## 2013-12-30 NOTE — Assessment & Plan Note (Signed)
Symptoms controlled.  Follow.   

## 2014-01-03 ENCOUNTER — Other Ambulatory Visit: Payer: BC Managed Care – PPO

## 2014-01-14 ENCOUNTER — Encounter: Payer: Self-pay | Admitting: Internal Medicine

## 2014-01-28 ENCOUNTER — Other Ambulatory Visit (INDEPENDENT_AMBULATORY_CARE_PROVIDER_SITE_OTHER): Payer: BC Managed Care – PPO

## 2014-01-28 DIAGNOSIS — F439 Reaction to severe stress, unspecified: Secondary | ICD-10-CM

## 2014-01-28 DIAGNOSIS — Z658 Other specified problems related to psychosocial circumstances: Secondary | ICD-10-CM

## 2014-01-28 DIAGNOSIS — E7800 Pure hypercholesterolemia, unspecified: Secondary | ICD-10-CM

## 2014-01-28 LAB — COMPREHENSIVE METABOLIC PANEL
ALK PHOS: 61 U/L (ref 39–117)
ALT: 21 U/L (ref 0–35)
AST: 17 U/L (ref 0–37)
Albumin: 3.5 g/dL (ref 3.5–5.2)
BILIRUBIN TOTAL: 0.7 mg/dL (ref 0.2–1.2)
BUN: 10 mg/dL (ref 6–23)
CO2: 19 mEq/L (ref 19–32)
CREATININE: 0.7 mg/dL (ref 0.4–1.2)
Calcium: 9 mg/dL (ref 8.4–10.5)
Chloride: 108 mEq/L (ref 96–112)
GFR: 102.9 mL/min (ref >60.00)
Glucose, Bld: 90 mg/dL (ref 70–99)
Potassium: 3.8 mEq/L (ref 3.5–5.1)
Sodium: 139 mEq/L (ref 135–145)
Total Protein: 7.1 g/dL (ref 6.0–8.3)

## 2014-01-28 LAB — LIPID PANEL
CHOL/HDL RATIO: 3
Cholesterol: 189 mg/dL (ref 0–200)
HDL: 58.6 mg/dL (ref >39.00)
LDL CALC: 115 mg/dL — AB (ref 0–99)
NonHDL: 130.4
TRIGLYCERIDES: 75 mg/dL (ref 0.0–149.0)
VLDL: 15 mg/dL (ref 0.0–40.0)

## 2014-01-28 LAB — CBC WITH DIFFERENTIAL/PLATELET
BASOS ABS: 0.1 10*3/uL (ref 0.0–0.1)
Basophils Relative: 1 % (ref 0.0–3.0)
EOS ABS: 0.2 10*3/uL (ref 0.0–0.7)
Eosinophils Relative: 2.8 % (ref 0.0–5.0)
HCT: 39.5 % (ref 36.0–46.0)
Hemoglobin: 13.3 g/dL (ref 12.0–15.0)
LYMPHS ABS: 1.9 10*3/uL (ref 0.7–4.0)
Lymphocytes Relative: 25.4 % (ref 12.0–46.0)
MCHC: 33.5 g/dL (ref 30.0–36.0)
MCV: 89.5 fl (ref 78.0–100.0)
MONO ABS: 0.4 10*3/uL (ref 0.1–1.0)
Monocytes Relative: 5.8 % (ref 3.0–12.0)
NEUTROS PCT: 65 % (ref 43.0–77.0)
Neutro Abs: 4.9 10*3/uL (ref 1.4–7.7)
PLATELETS: 299 10*3/uL (ref 150.0–400.0)
RBC: 4.42 Mil/uL (ref 3.87–5.11)
RDW: 12.7 % (ref 11.5–15.5)
WBC: 7.5 10*3/uL (ref 4.0–10.5)

## 2014-01-28 LAB — TSH: TSH: 0.57 u[IU]/mL (ref 0.35–4.50)

## 2014-01-29 ENCOUNTER — Encounter: Payer: Self-pay | Admitting: Internal Medicine

## 2014-02-15 ENCOUNTER — Encounter: Payer: Self-pay | Admitting: Internal Medicine

## 2014-02-27 ENCOUNTER — Other Ambulatory Visit: Payer: Self-pay | Admitting: Internal Medicine

## 2014-03-19 ENCOUNTER — Encounter: Payer: Self-pay | Admitting: Internal Medicine

## 2014-03-25 ENCOUNTER — Encounter: Payer: Self-pay | Admitting: Internal Medicine

## 2014-03-25 ENCOUNTER — Ambulatory Visit (INDEPENDENT_AMBULATORY_CARE_PROVIDER_SITE_OTHER): Payer: BC Managed Care – PPO | Admitting: Internal Medicine

## 2014-03-25 VITALS — BP 110/70 | HR 78 | Temp 98.9°F | Ht 63.0 in | Wt 169.0 lb

## 2014-03-25 DIAGNOSIS — J329 Chronic sinusitis, unspecified: Secondary | ICD-10-CM

## 2014-03-25 DIAGNOSIS — N926 Irregular menstruation, unspecified: Secondary | ICD-10-CM

## 2014-03-25 DIAGNOSIS — R519 Headache, unspecified: Secondary | ICD-10-CM

## 2014-03-25 DIAGNOSIS — R51 Headache: Secondary | ICD-10-CM

## 2014-03-25 DIAGNOSIS — F439 Reaction to severe stress, unspecified: Secondary | ICD-10-CM

## 2014-03-25 DIAGNOSIS — E7800 Pure hypercholesterolemia, unspecified: Secondary | ICD-10-CM

## 2014-03-25 DIAGNOSIS — K219 Gastro-esophageal reflux disease without esophagitis: Secondary | ICD-10-CM

## 2014-03-25 DIAGNOSIS — Z658 Other specified problems related to psychosocial circumstances: Secondary | ICD-10-CM

## 2014-03-25 NOTE — Progress Notes (Signed)
Pre visit review using our clinic review tool, if applicable. No additional management support is needed unless otherwise documented below in the visit note. 

## 2014-03-25 NOTE — Patient Instructions (Signed)
Saline nasal spray - flush nose at least 2-3x/day  Nasacort nasal spray - 2 sprays each nostril one time per day.  Do this in the evening.    Mucinex in the am and robitussin in the evening.

## 2014-03-26 ENCOUNTER — Encounter: Payer: Self-pay | Admitting: Internal Medicine

## 2014-03-26 LAB — HCG, QUANTITATIVE, PREGNANCY: Quantitative HCG: 0 m[IU]/mL

## 2014-03-31 ENCOUNTER — Encounter: Payer: Self-pay | Admitting: Internal Medicine

## 2014-03-31 NOTE — Progress Notes (Signed)
Subjective:    Patient ID: Ariel Morgan, female    DOB: [birth year], 38 y.o.   MRN: [id number]  HPI 38 year old female with past history of hypercholesterolemia who comes in today for f/u on this as well as for a complete physical exam.   States she is doing relatively well.  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  See my previous notes for details.  Feels she is handling things relatively well.  Off now for Christmas break.  States needed a break.  On Lexapro.   Eating and drinking well.  Has adjusted her diet.  Has lost weight.  Does report some increased sinus pressure and congestion.  Has been using some otc sinus medications.  She also noticed recently some vaginal spotting.  Is s/p endometrial ablation.  Has not had significant bleeding since her procedure.  Had some cramping with this spotting.  No spotting now.  No cramping now.       Past Medical History  Diagnosis Date  . Hypercholesterolemia   . Placenta previa   . GERD (gastroesophageal reflux disease)   . Frequent headaches     H/O  . Anemia     Outpatient Encounter Prescriptions as of 03/25/2014  Medication Sig  . ALPRAZolam (XANAX) 0.25 MG tablet Take 1 tablet (0.25 mg total) by mouth daily as needed for anxiety.  Marland Kitchen escitalopram (LEXAPRO) 20 MG tablet TAKE 1 TABLET (20 MG TOTAL) BY MOUTH DAILY.  Marland Kitchen escitalopram (LEXAPRO) 20 MG tablet TAKE 1 TABLET (20 MG TOTAL) BY MOUTH DAILY.  Marland Kitchen penciclovir (DENAVIR) 1 % cream Apply 1 application topically 4 (four) times daily.  . SUMAtriptan Succinate (IMITREX PO) Take by mouth as needed.  . topiramate (TOPAMAX) 50 MG tablet Take 50 mg by mouth 2 (two) times daily.    Review of Systems Patient denies any lightheadedness or dizziness.  Headaches better.   No chest pain, tightness or palpitations.  No increased shortness of breath, cough or congestion.  Increased sinus pressure and congestion.   No nausea or vomiting.  No increased acid reflux.  No abdominal pain or  cramping.  No bowel change, such as diarrhea, constipation, BRBPR or melana.  No urine change.   Increased stress.  Feels she is handling things relatively well.      Objective:   Physical Exam  Filed Vitals:   03/25/14 1331  BP: 110/70  Pulse: 78  Temp: 98.9 F (38.43 C)   38 year old female in no acute distress.   HEENT:  Nares- clear.  Oropharynx - without lesions. NECK:  Supple.  Nontender.  No audible bruit.  HEART:  Appears to be regular. LUNGS:  No crackles or wheezing audible.  Respirations even and unlabored.  RADIAL PULSE:  Equal bilaterally.    BREASTS:  No nipple discharge or nipple retraction present.  Could not appreciate any distinct nodules or axillary adenopathy.  ABDOMEN:  Soft, nontender.  Bowel sounds present and normal.  No audible abdominal bruit.  GU:  Not performed.    EXTREMITIES:  No increased edema present.  DP pulses palpable and equal bilaterally.          Assessment & Plan:  1. Irregular periods Unclear etiology. Is s/p endometrial ablation.  No spotting or bleeding now.  Follow.  Keep menstrual diary.   - B-HCG Quant  2. Hypercholesterolemia Low cholesterol diet and exercise.  Follow lipid panel.    3. Nonintractable headache, unspecified chronicity pattern, unspecified headache  type Sees neurology.  Still with headaches.  Will discuss with neurology.   4. Stress Increased stress with her job.  Off for Christmas break now.  Feels she is doing relatively well.  Follow.    5. Gastroesophageal reflux disease, esophagitis presence not specified Controlled.    6. Sinusitis, unspecified chronicity, unspecified location Treat with mucinex, saline nasal spray and steroid nasal spray as directed.  Hold abx.  Follow.  Notify me if worsens.    7. DESIRE FOR WEIGHT LOSS.  Has adjusted her diet.  Is exercising.  Has lost weight.  Follow.   HEALTH MAINTENANCE.  Physical today..  Pap 11/28/12 negative with negative HPV.    .  Mammogram 07/08/11 - Birads II.    I spent 25 minutes with the patient and more than 50% of the time was spent in consultation regarding the above.

## 2014-04-03 ENCOUNTER — Encounter: Payer: Self-pay | Admitting: Internal Medicine

## 2014-04-04 NOTE — Telephone Encounter (Signed)
CAlled and left message, notifying she needs to be seen in UC for evaluation per Dr. Nicki Reaper. Also sent mychart message

## 2014-06-11 ENCOUNTER — Other Ambulatory Visit: Payer: Self-pay | Admitting: Internal Medicine

## 2014-08-23 ENCOUNTER — Telehealth: Payer: Self-pay | Admitting: Internal Medicine

## 2014-08-23 NOTE — Telephone Encounter (Signed)
[facility] Patient Name: Ariel Morgan DOB: [birth year] Initial Comment Caller States she has a bad pain in her left side by her ribs that is radiating around to the middle of her back. Nurse Assessment Nurse: Erlene Quan, RN, Manuela Schwartz Date/Time Eilene Ghazi Time): 08/23/2014 1:31:27 PM Confirm and document reason for call. If symptomatic, describe symptoms. ---Caller States she has a bad pain in her left side by her ribs that is radiating around to the middle of her back - no urinate issues - started a week ago and it constant now - rates her pain at 8 , and Advil does not touch it - eating makes it worse Has the patient traveled out of the country within the last 30 days? ---Not Applicable Does the patient require triage? ---Yes Related visit to physician within the last 2 weeks? ---No Does the PT have any chronic conditions? (i.e. diabetes, asthma, etc.) ---No Did the patient indicate they were pregnant? ---No Guidelines Guideline Title Affirmed Question Affirmed Notes Flank Pain [1] SEVERE pain (e.g., excruciating, scale 8-10) AND [2] present > 1 hour Final Disposition User Go to ED Now Erlene Quan, RN, Manuela Schwartz

## 2014-08-23 NOTE — Telephone Encounter (Signed)
FYI Left message on pts VM to return call with an update.

## 2014-08-23 NOTE — Telephone Encounter (Signed)
Thanks.  Hold for f/u from pt.  Confirm evaluated.

## 2014-08-26 NOTE — Telephone Encounter (Signed)
Left message for pt to return my call. Also sent mychart msg.

## 2014-08-26 NOTE — Telephone Encounter (Signed)
Per Dr. Nicki Reaper: I can see her on 08/28/14 at 2:00 (block 30 min) - for f/u of this pain. Sent mychart msg

## 2014-08-28 ENCOUNTER — Ambulatory Visit (INDEPENDENT_AMBULATORY_CARE_PROVIDER_SITE_OTHER): Payer: BC Managed Care – PPO | Admitting: Internal Medicine

## 2014-08-28 ENCOUNTER — Encounter: Payer: Self-pay | Admitting: Internal Medicine

## 2014-08-28 VITALS — BP 110/70 | HR 82 | Temp 98.1°F | Ht 63.0 in | Wt 181.2 lb

## 2014-08-28 DIAGNOSIS — R1012 Left upper quadrant pain: Secondary | ICD-10-CM

## 2014-08-28 LAB — URINALYSIS, ROUTINE W REFLEX MICROSCOPIC
BILIRUBIN URINE: NEGATIVE
HGB URINE DIPSTICK: NEGATIVE
Ketones, ur: NEGATIVE
Leukocytes, UA: NEGATIVE
Nitrite: NEGATIVE
PH: 5.5 (ref 5.0–8.0)
SPECIFIC GRAVITY, URINE: 1.01 (ref 1.000–1.030)
Total Protein, Urine: NEGATIVE
URINE GLUCOSE: NEGATIVE
Urobilinogen, UA: 0.2 (ref 0.0–1.0)

## 2014-08-28 MED ORDER — GABAPENTIN 100 MG PO CAPS: 100.0000 mg | ORAL_CAPSULE | Freq: Two times a day (BID) | ORAL | Status: DC | PRN

## 2014-08-28 NOTE — Progress Notes (Signed)
Pre visit review using our clinic review tool, if applicable. No additional management support is needed unless otherwise documented below in the visit note. 

## 2014-08-28 NOTE — Progress Notes (Signed)
Patient ID: Ariel Morgan, female   DOB: 27-Jun-1975, 39 y.o.   MRN: [id number]   Subjective:    Patient ID: Ariel Morgan, female    DOB: 05-16-1975, 39 y.o.   MRN: [id number]  HPI  Patient here as a work in with concerns regarding left upper quadrant pain.  Was seen at acute care on 08/23/14.  See that note for details.  Was started on PPI.  Labs, including liver panel and pancreas test, were wnl.  Symptoms started two weeks ago.  Described as a burning pain.  Constant.  Worse after eating.  Feels full.  Feels bloated.  Describes pain - radiating around from posterior left lateral mid back around to LUQ.  No rash.  Movement makes better.  No vomiting.  No bowel change.     Past Medical History  Diagnosis Date  . Hypercholesterolemia   . Placenta previa   . GERD (gastroesophageal reflux disease)   . Frequent headaches     H/O  . Anemia     Current Outpatient Prescriptions on File Prior to Visit  Medication Sig Dispense Refill  . ALPRAZolam (XANAX) 0.25 MG tablet Take 1 tablet (0.25 mg total) by mouth daily as needed for anxiety. 30 tablet 0  . escitalopram (LEXAPRO) 20 MG tablet TAKE 1 TABLET (20 MG TOTAL) BY MOUTH DAILY. 30 tablet 2  . penciclovir (DENAVIR) 1 % cream Apply 1 application topically 4 (four) times daily. 1.5 g 0  . SUMAtriptan Succinate (IMITREX PO) Take by mouth as needed.    . topiramate (TOPAMAX) 50 MG tablet Take 50 mg by mouth 2 (two) times daily.     No current facility-administered medications on file prior to visit.    Review of Systems  Constitutional: Negative for fever and unexpected weight change.  HENT: Negative for congestion and sinus pressure.   Respiratory: Negative for cough, chest tightness and shortness of breath.   Cardiovascular: Negative for chest pain, palpitations and leg swelling.  Gastrointestinal: Positive for abdominal pain (LUQ pain as outlined.  radiates around to back as outlined. ). Negative for nausea, vomiting and diarrhea.         Feels bloating.  Appears to worsen after eating.   Genitourinary: Negative for dysuria, hematuria and difficulty urinating.  Skin: Negative for color change and rash.  Neurological: Negative for dizziness, light-headedness and headaches.  Hematological: Negative for adenopathy. Does not bruise/bleed easily.  Psychiatric/Behavioral: Negative for dysphoric mood and agitation.       Objective:    Physical Exam  Constitutional: She appears well-developed and well-nourished. No distress.  HENT:  Nose: Nose normal.  Mouth/Throat: Oropharynx is clear and moist.  Neck: Neck supple. No thyromegaly present.  Cardiovascular: Normal rate and regular rhythm.   Pulmonary/Chest: Breath sounds normal. No respiratory distress. She has no wheezes.  Abdominal: Soft. Bowel sounds are normal. There is no rebound and no guarding.  Musculoskeletal: She exhibits no edema or tenderness.  Lymphadenopathy:    She has no cervical adenopathy.  Skin: No rash noted. No erythema.  Psychiatric: She has a normal mood and affect. Her behavior is normal.    BP 110/70 mmHg  Pulse 82  Temp(Src) 98.1 F (36.7 C) (Oral)  Ht 5\' 3"  (1.6 m)  Wt 181 lb 4 oz (82.214 kg)  BMI 32.11 kg/m2  SpO2 97% Wt Readings from Last 3 Encounters:  08/28/14 181 lb 4 oz (82.214 kg)  03/25/14 169 lb (76.658 kg)  12/24/13 166 lb 4 oz (75.411  kg)     Lab Results  Component Value Date   WBC 7.5 01/28/2014   HGB 13.3 01/28/2014   HCT 39.5 01/28/2014   PLT 299.0 01/28/2014   GLUCOSE 90 01/28/2014   CHOL 189 01/28/2014   TRIG 75.0 01/28/2014   HDL 58.60 01/28/2014   LDLDIRECT 132.6 01/29/2013   LDLCALC 115* 01/28/2014   ALT 21 01/28/2014   AST 17 01/28/2014   NA 139 01/28/2014   K 3.8 01/28/2014   CL 108 01/28/2014   CREATININE 0.7 01/28/2014   BUN 10 01/28/2014   CO2 19 01/28/2014   TSH 0.57 01/28/2014       Assessment & Plan:   Problem List Items Addressed This Visit    LUQ pain    Pain as outlined.   Appears to worsen after eating.  Some increased bloating.  Pain also better with certain positions.  Some reproducible pain - posterior mid back.  No rash.  A component appears to be msk.  Recent labs - liver panel and pancreas tests wnl.  Continue prilosec.  Check abdominal ultrasound.  Hold on antiiflammatories.  Tylenol not helping.  On lexapro, hold on ultram.  Try gabapentin for the radiating pain.         Other Visit Diagnoses    Left upper quadrant pain    -  Primary    Relevant Orders    US Abdomen Complete    Urinalysis, Routine w reflex microscopic (Completed)        [name], MD

## 2014-08-29 ENCOUNTER — Encounter: Payer: Self-pay | Admitting: Internal Medicine

## 2014-08-30 ENCOUNTER — Other Ambulatory Visit: Payer: Self-pay | Admitting: Internal Medicine

## 2014-08-30 DIAGNOSIS — R1012 Left upper quadrant pain: Secondary | ICD-10-CM

## 2014-09-01 ENCOUNTER — Encounter: Payer: Self-pay | Admitting: Internal Medicine

## 2014-09-01 DIAGNOSIS — R1012 Left upper quadrant pain: Secondary | ICD-10-CM | POA: Insufficient documentation

## 2014-09-01 NOTE — Assessment & Plan Note (Signed)
Pain as outlined.  Appears to worsen after eating.  Some increased bloating.  Pain also better with certain positions.  Some reproducible pain - posterior mid back.  No rash.  A component appears to be msk.  Recent labs - liver panel and pancreas tests wnl.  Continue prilosec.  Check abdominal ultrasound.  Hold on antiiflammatories.  Tylenol not helping.  On lexapro, hold on ultram.  Try gabapentin for the radiating pain.

## 2014-09-03 ENCOUNTER — Encounter: Payer: Self-pay | Admitting: Internal Medicine

## 2014-09-03 ENCOUNTER — Ambulatory Visit
Admission: RE | Admit: 2014-09-03 | Discharge: 2014-09-03 | Disposition: A | Discharge status: Home / Self Care | Payer: BC Managed Care – PPO | Source: Ambulatory Visit | Attending: Internal Medicine | Admitting: Internal Medicine

## 2014-09-03 DIAGNOSIS — R1012 Left upper quadrant pain: Secondary | ICD-10-CM | POA: Diagnosis not present

## 2014-09-03 DIAGNOSIS — Z9049 Acquired absence of other specified parts of digestive tract: Secondary | ICD-10-CM | POA: Insufficient documentation

## 2014-09-10 ENCOUNTER — Encounter: Payer: Self-pay | Admitting: Internal Medicine

## 2014-09-12 ENCOUNTER — Encounter: Payer: Self-pay | Admitting: Internal Medicine

## 2014-09-12 ENCOUNTER — Ambulatory Visit (INDEPENDENT_AMBULATORY_CARE_PROVIDER_SITE_OTHER): Payer: BC Managed Care – PPO | Admitting: Internal Medicine

## 2014-09-12 VITALS — BP 109/71 | HR 77 | Temp 98.0°F | Ht 63.0 in | Wt 182.1 lb

## 2014-09-12 DIAGNOSIS — R1012 Left upper quadrant pain: Secondary | ICD-10-CM | POA: Diagnosis not present

## 2014-09-12 DIAGNOSIS — B029 Zoster without complications: Secondary | ICD-10-CM | POA: Diagnosis not present

## 2014-09-12 MED ORDER — VALACYCLOVIR HCL 1 G PO TABS: 1000.0000 mg | ORAL_TABLET | Freq: Three times a day (TID) | ORAL | Status: DC

## 2014-09-12 NOTE — Progress Notes (Signed)
Pre visit review using our clinic review tool, if applicable. No additional management support is needed unless otherwise documented below in the visit note. 

## 2014-09-16 ENCOUNTER — Encounter: Payer: Self-pay | Admitting: Internal Medicine

## 2014-09-16 DIAGNOSIS — B029 Zoster without complications: Secondary | ICD-10-CM | POA: Insufficient documentation

## 2014-09-16 NOTE — Progress Notes (Signed)
Patient ID: Ariel Morgan, female   DOB: [birth year], 39 y.o.   MRN: [id number]   Subjective:    Patient ID: Ariel Morgan, female    DOB: 1975-12-14, 39 y.o.   MRN: [id number]  HPI  Patient here as a work in with concerns over reoccurring shingles.  She noticed rash on left side of her face.  Lesion started a couple of day ago.  Rash has progressed now.  Left side of her face.  Has had shingles previously.  Feels similar.  No fever.  Previous side pain has resolved.  Abdominal US unrevealing.  On gabapentin.  No significant increased pain - left face.    Past Medical History  Diagnosis Date  . Hypercholesterolemia   . Placenta previa   . GERD (gastroesophageal reflux disease)   . Frequent headaches     H/O  . Anemia     Current Outpatient Prescriptions on File Prior to Visit  Medication Sig Dispense Refill  . ALPRAZolam (XANAX) 0.25 MG tablet Take 1 tablet (0.25 mg total) by mouth daily as needed for anxiety. 30 tablet 0  . escitalopram (LEXAPRO) 20 MG tablet TAKE 1 TABLET (20 MG TOTAL) BY MOUTH DAILY. 30 tablet 2  . gabapentin (NEURONTIN) 100 MG capsule Take 1 capsule (100 mg total) by mouth 2 (two) times daily as needed. 60 capsule 0  . penciclovir (DENAVIR) 1 % cream Apply 1 application topically 4 (four) times daily. 1.5 g 0  . SUMAtriptan Succinate (IMITREX PO) Take by mouth as needed.    . topiramate (TOPAMAX) 50 MG tablet Take 50 mg by mouth 2 (two) times daily.     No current facility-administered medications on file prior to visit.    Review of Systems  Constitutional: Negative for fever and appetite change.  HENT: Negative for congestion and sinus pressure.   Respiratory: Negative for cough, chest tightness and shortness of breath.   Cardiovascular: Negative for chest pain and palpitations.  Skin:       Rash as outlined.  Only involves the face.   Neurological: Negative for dizziness, light-headedness and headaches.       Objective:    Physical Exam    Constitutional: She appears well-developed and well-nourished. No distress.  HENT:  Nose: Nose normal.  Mouth/Throat: Oropharynx is clear and moist.  Neck: Neck supple.  Cardiovascular: Normal rate and regular rhythm.   Pulmonary/Chest: Breath sounds normal. No respiratory distress. She has no wheezes.  Abdominal: Soft. Bowel sounds are normal. There is no tenderness.  Lymphadenopathy:    She has no cervical adenopathy.  Skin:  Erythematous based rash - vesicles.  Appears to be c/w zoster.  Rash localized to left side of face.      BP 109/71 mmHg  Pulse 77  Temp(Src) 98 F (36.7 C) (Oral)  Ht 5\' 3"  (1.6 m)  Wt 182 lb 2 oz (82.611 kg)  BMI 32.27 kg/m2  SpO2 97% Wt Readings from Last 3 Encounters:  09/12/14 182 lb 2 oz (82.611 kg)  08/28/14 181 lb 4 oz (82.214 kg)  03/25/14 169 lb (76.658 kg)     Lab Results  Component Value Date   WBC 7.5 01/28/2014   HGB 13.3 01/28/2014   HCT 39.5 01/28/2014   PLT 299.0 01/28/2014   GLUCOSE 90 01/28/2014   CHOL 189 01/28/2014   TRIG 75.0 01/28/2014   HDL 58.60 01/28/2014   LDLDIRECT 132.6 01/29/2013   LDLCALC 115* 01/28/2014   ALT 21 01/28/2014   AST  17 01/28/2014   NA 139 01/28/2014   K 3.8 01/28/2014   CL 108 01/28/2014   CREATININE 0.7 01/28/2014   BUN 10 01/28/2014   CO2 19 01/28/2014   TSH 0.57 01/28/2014    US Abdomen Complete  09/03/2014   CLINICAL DATA:  39 year old female with left upper quadrant pain for 3 weeks. Prior cholecystectomy. Initial encounter.  EXAM: ULTRASOUND ABDOMEN COMPLETE  COMPARISON:  04/24/2008 ultrasound.  FINDINGS: Gallbladder: Surgically removed.  Common bile duct: Diameter: 2.8 mm.  Liver: No focal lesion identified. Within normal limits in parenchymal echogenicity.  IVC: No abnormality visualized.  Pancreas: Poorly delineated secondary to bowel gas. Portions visualized appear echogenic without obvious mass  Spleen: Size and appearance within normal limits.  Right Kidney: Length: 9.8 cm.  Echogenicity within normal limits. No mass or hydronephrosis visualized.  Left Kidney: Length: 11.6 cm. Echogenicity within normal limits. No mass or hydronephrosis visualized.  Abdominal aorta: No aneurysm visualized.  Other findings: None.  IMPRESSION: Post cholecystectomy.  Pancreas suboptimally evaluated secondary to bowel gas.  Remainder of exam unremarkable as noted above.   Electronically Signed   By: Genia Del M.D.   On: 09/03/2014 08:13       Assessment & Plan:   Problem List Items Addressed This Visit    Herpes zoster    Treat with valtrex 1000mg  tid x 1 week.  On gabapentin.  Follow.  Will have opthalmology evaluate given proximity to her eye.        Relevant Medications   valACYclovir (VALTREX) 1000 MG tablet   LUQ pain    Abdominal US as outlined.  Pain resolved.         Other Visit Diagnoses    Shingles    -  Primary    Relevant Medications    valACYclovir (VALTREX) 1000 MG tablet    Other Relevant Orders    Ambulatory referral to Ophthalmology        [name], MD

## 2014-09-16 NOTE — Assessment & Plan Note (Signed)
Abdominal US as outlined.  Pain resolved.

## 2014-09-16 NOTE — Assessment & Plan Note (Signed)
Treat with valtrex 1000mg  tid x 1 week.  On gabapentin.  Follow.  Will have opthalmology evaluate given proximity to her eye.

## 2014-09-21 ENCOUNTER — Other Ambulatory Visit: Payer: Self-pay | Admitting: Internal Medicine

## 2014-09-23 ENCOUNTER — Other Ambulatory Visit: Payer: Self-pay | Admitting: Internal Medicine

## 2014-09-27 ENCOUNTER — Other Ambulatory Visit: Payer: Self-pay | Admitting: Internal Medicine

## 2015-01-28 ENCOUNTER — Other Ambulatory Visit: Payer: Self-pay | Admitting: Internal Medicine

## 2015-02-08 ENCOUNTER — Encounter: Payer: Self-pay | Admitting: Internal Medicine

## 2015-02-08 ENCOUNTER — Other Ambulatory Visit: Payer: Self-pay | Admitting: Internal Medicine

## 2015-02-10 ENCOUNTER — Other Ambulatory Visit: Payer: Self-pay | Admitting: Internal Medicine

## 2015-02-10 NOTE — Telephone Encounter (Signed)
Please advise 

## 2015-02-10 NOTE — Telephone Encounter (Signed)
Please call and see if she can come in at 3:30 on 03/14/15 for physical.  Thanks.

## 2015-02-12 NOTE — Telephone Encounter (Signed)
This medication has not been filled since 07/2013.  Need to confirm pt doing ok.  I am not opposed to refilling, I just need to know how she is doing.

## 2015-02-12 NOTE — Telephone Encounter (Signed)
She does want it but has not been on it since April of last year.

## 2015-02-13 MED ORDER — ALPRAZOLAM 0.25 MG PO TABS: 0.2500 mg | ORAL_TABLET | Freq: Every day | ORAL | Status: DC | PRN

## 2015-02-13 NOTE — Telephone Encounter (Signed)
Called [facility]

## 2015-02-13 NOTE — Telephone Encounter (Signed)
I have ok'd the rx for xanax #15 with no refills.  Will sign rx and place on your desk.  Please clarify with pt doing ok and confirm no concern about pregnancy.  Thanks

## 2015-03-14 ENCOUNTER — Ambulatory Visit (INDEPENDENT_AMBULATORY_CARE_PROVIDER_SITE_OTHER): Payer: BC Managed Care – PPO | Admitting: Internal Medicine

## 2015-03-14 ENCOUNTER — Encounter: Payer: Self-pay | Admitting: Internal Medicine

## 2015-03-14 ENCOUNTER — Other Ambulatory Visit (HOSPITAL_COMMUNITY)
Admission: RE | Admit: 2015-03-14 | Discharge: 2015-03-14 | Disposition: A | Discharge status: Home / Self Care | Payer: BC Managed Care – PPO | Source: Ambulatory Visit | Attending: Internal Medicine | Admitting: Internal Medicine

## 2015-03-14 VITALS — BP 100/70 | HR 89 | Temp 98.1°F | Resp 18 | Ht 63.0 in | Wt 186.1 lb

## 2015-03-14 DIAGNOSIS — F439 Reaction to severe stress, unspecified: Secondary | ICD-10-CM

## 2015-03-14 DIAGNOSIS — Z0000 Encounter for general adult medical examination without abnormal findings: Secondary | ICD-10-CM

## 2015-03-14 DIAGNOSIS — Z124 Encounter for screening for malignant neoplasm of cervix: Secondary | ICD-10-CM | POA: Diagnosis not present

## 2015-03-14 DIAGNOSIS — R51 Headache: Secondary | ICD-10-CM

## 2015-03-14 DIAGNOSIS — Z658 Other specified problems related to psychosocial circumstances: Secondary | ICD-10-CM

## 2015-03-14 DIAGNOSIS — Z1151 Encounter for screening for human papillomavirus (HPV): Secondary | ICD-10-CM | POA: Insufficient documentation

## 2015-03-14 DIAGNOSIS — Z01411 Encounter for gynecological examination (general) (routine) with abnormal findings: Secondary | ICD-10-CM | POA: Diagnosis present

## 2015-03-14 DIAGNOSIS — E7800 Pure hypercholesterolemia, unspecified: Secondary | ICD-10-CM

## 2015-03-14 DIAGNOSIS — R519 Headache, unspecified: Secondary | ICD-10-CM

## 2015-03-14 DIAGNOSIS — Z23 Encounter for immunization: Secondary | ICD-10-CM | POA: Diagnosis not present

## 2015-03-14 NOTE — Patient Instructions (Signed)

## 2015-03-14 NOTE — Progress Notes (Signed)
Pre-visit discussion using our clinic review tool. No additional management support is needed unless otherwise documented below in the visit note.  

## 2015-03-14 NOTE — Progress Notes (Signed)
Patient ID: Ariel Morgan, female   DOB: December 18, 1975, 39 y.o.   MRN: [id number]   Subjective:    Patient ID: Ariel Morgan, female    DOB: 1976/01/09, 39 y.o.   MRN: [id number]  HPI  Patient with past history of hypercholesterolemia, headaches and increased stress.  She comes in today to follow up on these issues as well as for a complete physical exam.  She reports stress at school is better.  Still with stress, but is better.  Headaches intermittently.  Takes imitrex prn.  On topamax.  Discussed staying active.  Breathing stable.  Taking allegra D for sinus and allergy symptoms.  S/p ablation.  No significant bleeding.    Past Medical History  Diagnosis Date  . Hypercholesterolemia   . Placenta previa   . GERD (gastroesophageal reflux disease)   . Frequent headaches     H/O  . Anemia    Past Surgical History  Procedure Laterality Date  . Cesarean section  2003 & 2010  . Cholecystectomy  2005   Family History  Problem Relation Age of Onset  . Hyperlipidemia Father   . Diabetes Sister   . Diabetes Maternal Grandmother   . Cancer Maternal Grandmother     vaginal  . Cirrhosis Maternal Grandmother   . Colon polyps Maternal Grandmother    Social History   Social History  . Marital Status: Married    Spouse Name: N/A  . Number of Children: 3  . Years of Education: N/A   Social History Main Topics  . Smoking status: Former Smoker -- 1.00 packs/day    Types: Cigarettes  . Smokeless tobacco: Never Used  . Alcohol Use: 0.0 oz/week    0 Standard drinks or equivalent per week     Comment: rarely  . Drug Use: No  . Sexual Activity: Not Asked   Other Topics Concern  . None   Social History Narrative    Outpatient Encounter Prescriptions as of 03/14/2015  Medication Sig  . ALPRAZolam (XANAX) 0.25 MG tablet Take 1 tablet (0.25 mg total) by mouth daily as needed for anxiety.  Marland Kitchen escitalopram (LEXAPRO) 20 MG tablet TAKE 1 TABLET BY MOUTH EVERY DAY  . penciclovir  (DENAVIR) 1 % cream Apply 1 application topically 4 (four) times daily.  . SUMAtriptan Succinate (IMITREX PO) Take by mouth as needed.  . topiramate (TOPAMAX) 50 MG tablet Take 50 mg by mouth 2 (two) times daily.  . [DISCONTINUED] gabapentin (NEURONTIN) 100 MG capsule TAKE 1 CAPSULE (100 MG TOTAL) BY MOUTH 2 (TWO) TIMES DAILY AS NEEDED.  . [DISCONTINUED] valACYclovir (VALTREX) 1000 MG tablet Take 1 tablet (1,000 mg total) by mouth 3 (three) times daily.   No facility-administered encounter medications on file as of 03/14/2015.    Review of Systems  Constitutional: Negative for appetite change and unexpected weight change.  HENT:       Has been having some allergy and sinus issues.  On allegra D.    Eyes: Negative for pain and visual disturbance.  Respiratory: Negative for cough, chest tightness and shortness of breath.   Cardiovascular: Negative for chest pain, palpitations and leg swelling.  Gastrointestinal: Negative for abdominal pain, diarrhea and constipation.  Genitourinary: Negative for dysuria and difficulty urinating.  Musculoskeletal: Negative for back pain and joint swelling.  Skin: Negative for color change and rash.  Neurological: Positive for headaches (intermittently.  ). Negative for dizziness and light-headedness.  Hematological: Negative for adenopathy. Does not bruise/bleed easily.  Psychiatric/Behavioral: Negative  for dysphoric mood and agitation.       Objective:    Physical Exam  Constitutional: She is oriented to person, place, and time. She appears well-developed and well-nourished. No distress.  HENT:  Nose: Nose normal.  Mouth/Throat: Oropharynx is clear and moist.  Eyes: Right eye exhibits no discharge. Left eye exhibits no discharge. No scleral icterus.  Neck: Neck supple. No thyromegaly present.  Cardiovascular: Normal rate and regular rhythm.   Pulmonary/Chest: Breath sounds normal. No accessory muscle usage. No tachypnea. No respiratory distress. She  has no decreased breath sounds. She has no wheezes. She has no rhonchi. Right breast exhibits no inverted nipple, no mass, no nipple discharge and no tenderness (no axillary adenopathy). Left breast exhibits no inverted nipple, no mass, no nipple discharge and no tenderness (no axilarry adenopathy).  Abdominal: Soft. Bowel sounds are normal. There is no tenderness.  Genitourinary:  Normal external genitalia.  Vaginal vault without lesions.  Cervix identified.  Pap smear performed.  Could not appreciate any adnexal masses or tenderness.    Musculoskeletal: She exhibits no edema or tenderness.  Lymphadenopathy:    She has no cervical adenopathy.  Neurological: She is alert and oriented to person, place, and time.  Skin: Skin is warm. No rash noted. No erythema.  Psychiatric: She has a normal mood and affect. Her behavior is normal.    BP 100/70 mmHg  Pulse 89  Temp(Src) 98.1 F (36.7 C) (Oral)  Resp 18  Ht 5\' 3"  (1.6 m)  Wt 186 lb 2 oz (84.426 kg)  BMI 32.98 kg/m2  SpO2 97% Wt Readings from Last 3 Encounters:  03/14/15 186 lb 2 oz (84.426 kg)  09/12/14 182 lb 2 oz (82.611 kg)  08/28/14 181 lb 4 oz (82.214 kg)     Lab Results  Component Value Date   WBC 7.5 01/28/2014   HGB 13.3 01/28/2014   HCT 39.5 01/28/2014   PLT 299.0 01/28/2014   GLUCOSE 90 01/28/2014   CHOL 189 01/28/2014   TRIG 75.0 01/28/2014   HDL 58.60 01/28/2014   LDLDIRECT 132.6 01/29/2013   LDLCALC 115* 01/28/2014   ALT 21 01/28/2014   AST 17 01/28/2014   NA 139 01/28/2014   K 3.8 01/28/2014   CL 108 01/28/2014   CREATININE 0.7 01/28/2014   BUN 10 01/28/2014   CO2 19 01/28/2014   TSH 0.57 01/28/2014    US Abdomen Complete  09/03/2014  CLINICAL DATA:  39 year old female with left upper quadrant pain for 3 weeks. Prior cholecystectomy. Initial encounter. EXAM: ULTRASOUND ABDOMEN COMPLETE COMPARISON:  04/24/2008 ultrasound. FINDINGS: Gallbladder: Surgically removed. Common bile duct: Diameter: 2.8 mm.  Liver: No focal lesion identified. Within normal limits in parenchymal echogenicity. IVC: No abnormality visualized. Pancreas: Poorly delineated secondary to bowel gas. Portions visualized appear echogenic without obvious mass Spleen: Size and appearance within normal limits. Right Kidney: Length: 9.8 cm. Echogenicity within normal limits. No mass or hydronephrosis visualized. Left Kidney: Length: 11.6 cm. Echogenicity within normal limits. No mass or hydronephrosis visualized. Abdominal aorta: No aneurysm visualized. Other findings: None. IMPRESSION: Post cholecystectomy. Pancreas suboptimally evaluated secondary to bowel gas. Remainder of exam unremarkable as noted above. Electronically Signed   By: Genia Del M.D.   On: 09/03/2014 08:13       Assessment & Plan:   Problem List Items Addressed This Visit    Headache    Being followed by neurology.  On topamax.  Takes imitrex prn.        Health care  maintenance    Physical today 03/14/15.  Schedule mammogram next year.        Hypercholesterolemia    Low cholesterol diet and exercise.  Follow lipid panel.        Stress    On lexapro.  Stress is better.  Does not feel needs anything more at this time.  Follow.         Other Visit Diagnoses    Pap smear for cervical cancer screening    -  Primary    Relevant Orders    Cytology - PAP        [name], MD

## 2015-03-15 ENCOUNTER — Encounter: Payer: Self-pay | Admitting: Internal Medicine

## 2015-03-15 DIAGNOSIS — Z0000 Encounter for general adult medical examination without abnormal findings: Secondary | ICD-10-CM | POA: Insufficient documentation

## 2015-03-15 NOTE — Assessment & Plan Note (Signed)
Low cholesterol diet and exercise.  Follow lipid panel.   

## 2015-03-15 NOTE — Assessment & Plan Note (Signed)
On lexapro.  Stress is better.  Does not feel needs anything more at this time.  Follow.

## 2015-03-15 NOTE — Assessment & Plan Note (Signed)
Being followed by neurology.  On topamax.  Takes imitrex prn.

## 2015-03-15 NOTE — Assessment & Plan Note (Signed)
Physical today 03/14/15.  Schedule mammogram next year.

## 2015-03-18 LAB — CYTOLOGY - PAP

## 2015-03-20 ENCOUNTER — Encounter: Payer: Self-pay | Admitting: Internal Medicine

## 2015-03-26 NOTE — Telephone Encounter (Signed)
Unread mychart message mailed to patient 

## 2015-04-28 ENCOUNTER — Encounter: Payer: Self-pay | Admitting: Internal Medicine

## 2015-04-29 ENCOUNTER — Ambulatory Visit (INDEPENDENT_AMBULATORY_CARE_PROVIDER_SITE_OTHER): Payer: BC Managed Care – PPO | Admitting: Internal Medicine

## 2015-04-29 ENCOUNTER — Encounter: Payer: Self-pay | Admitting: Internal Medicine

## 2015-04-29 VITALS — BP 100/70 | HR 91 | Temp 97.4°F | Resp 18 | Ht 63.0 in | Wt 190.5 lb

## 2015-04-29 DIAGNOSIS — R21 Rash and other nonspecific skin eruption: Secondary | ICD-10-CM

## 2015-04-29 DIAGNOSIS — J329 Chronic sinusitis, unspecified: Secondary | ICD-10-CM

## 2015-04-29 MED ORDER — HYDROXYZINE HCL 10 MG PO TABS: 10.0000 mg | ORAL_TABLET | Freq: Three times a day (TID) | ORAL | Status: DC | PRN

## 2015-04-29 NOTE — Telephone Encounter (Signed)
Will you please put her on the schedule at 4:30 today.  See my chart message.  Thanks

## 2015-04-29 NOTE — Progress Notes (Signed)
Pre-visit discussion using our clinic review tool. No additional management support is needed unless otherwise documented below in the visit note.  

## 2015-05-04 ENCOUNTER — Encounter: Payer: Self-pay | Admitting: Internal Medicine

## 2015-05-04 DIAGNOSIS — R21 Rash and other nonspecific skin eruption: Secondary | ICD-10-CM | POA: Insufficient documentation

## 2015-05-04 NOTE — Progress Notes (Signed)
Patient ID: Ariel Morgan, female   DOB: [birth year], 40 y.o.   MRN: [id number]   Subjective:    Patient ID: Ariel Morgan, female    DOB: 15-May-1975, 40 y.o.   MRN: [id number]  HPI  Patient with past history of hypercholesterolemia and GERD.  She comes in today as a work in with concerns regarding a persistent rash.  She started having sneezing five days ago.  Symptoms progressed.  Went to acute care.  Was given augmentin.  At acute care, had bilateral hand swelling and rash on her hands.  This was prior to starting the augmentin.  Was instructed to stop the mucinex D.  Since her visit to acute care, rash has spread.  She noticed increased rash lower abdomen and groin.  Hands are better.  Has taken benadryl.     Past Medical History  Diagnosis Date  . Hypercholesterolemia   . Placenta previa   . GERD (gastroesophageal reflux disease)   . Frequent headaches     H/O  . Anemia    Past Surgical History  Procedure Laterality Date  . Cesarean section  2003 & 2010  . Cholecystectomy  2005   Family History  Problem Relation Age of Onset  . Hyperlipidemia Father   . Diabetes Sister   . Diabetes Maternal Grandmother   . Cancer Maternal Grandmother     vaginal  . Cirrhosis Maternal Grandmother   . Colon polyps Maternal Grandmother    Social History   Social History  . Marital Status: Married    Spouse Name: N/A  . Number of Children: 3  . Years of Education: N/A   Social History Main Topics  . Smoking status: Former Smoker -- 1.00 packs/day    Types: Cigarettes  . Smokeless tobacco: Never Used  . Alcohol Use: 0.0 oz/week    0 Standard drinks or equivalent per week     Comment: rarely  . Drug Use: No  . Sexual Activity: Not Asked   Other Topics Concern  . None   Social History Narrative    Outpatient Encounter Prescriptions as of 04/29/2015  Medication Sig  . ALPRAZolam (XANAX) 0.25 MG tablet Take 1 tablet (0.25 mg total) by mouth daily as needed for anxiety.    Marland Kitchen escitalopram (LEXAPRO) 20 MG tablet TAKE 1 TABLET BY MOUTH EVERY DAY  . penciclovir (DENAVIR) 1 % cream Apply 1 application topically 4 (four) times daily.  . SUMAtriptan Succinate (IMITREX PO) Take by mouth as needed.  . topiramate (TOPAMAX) 50 MG tablet Take 50 mg by mouth 2 (two) times daily.  Marland Kitchen amoxicillin-clavulanate (AUGMENTIN) 875-125 MG tablet   . hydrOXYzine (ATARAX/VISTARIL) 10 MG tablet Take 1 tablet (10 mg total) by mouth 3 (three) times daily as needed.   No facility-administered encounter medications on file as of 04/29/2015.    Review of Systems  Constitutional: Positive for fatigue. Negative for appetite change.  HENT: Positive for congestion and sinus pressure.   Respiratory: Negative for cough, chest tightness and shortness of breath.   Cardiovascular: Negative for chest pain, palpitations and leg swelling.  Gastrointestinal: Negative for nausea, vomiting, abdominal pain and diarrhea.  Genitourinary: Negative for dysuria and difficulty urinating.  Musculoskeletal: Negative for back pain and joint swelling.  Skin: Positive for rash.       Hand swelling better.    Neurological: Negative for dizziness, light-headedness and headaches.  Psychiatric/Behavioral: Negative for dysphoric mood and agitation.       Objective:    Physical  Exam  Constitutional: She appears well-developed and well-nourished. No distress.  HENT:  Mouth/Throat: Oropharynx is clear and moist.  Nares - slightly erythematous turbinates.    Eyes: Conjunctivae are normal. Right eye exhibits no discharge. Left eye exhibits no discharge.  Neck: Neck supple.  Cardiovascular: Normal rate and regular rhythm.   Pulmonary/Chest: Breath sounds normal. No respiratory distress. She has no wheezes.  Abdominal: Soft. Bowel sounds are normal. There is no tenderness.  Musculoskeletal: She exhibits no edema or tenderness.  Lymphadenopathy:    She has no cervical adenopathy.  Skin:  Erythematous rash - lower  abdomen/pelvic region.  Minimal rash - noticed over her hands.  Some minimal swelling.      BP 100/70 mmHg  Pulse 91  Temp(Src) 97.4 F (36.3 C) (Oral)  Resp 18  Ht 5\' 3"  (1.6 m)  Wt 190 lb 8 oz (86.41 kg)  BMI 33.75 kg/m2  SpO2 99% Wt Readings from Last 3 Encounters:  04/29/15 190 lb 8 oz (86.41 kg)  03/14/15 186 lb 2 oz (84.426 kg)  09/12/14 182 lb 2 oz (82.611 kg)     Lab Results  Component Value Date   WBC 7.5 01/28/2014   HGB 13.3 01/28/2014   HCT 39.5 01/28/2014   PLT 299.0 01/28/2014   GLUCOSE 90 01/28/2014   CHOL 189 01/28/2014   TRIG 75.0 01/28/2014   HDL 58.60 01/28/2014   LDLDIRECT 132.6 01/29/2013   LDLCALC 115* 01/28/2014   ALT 21 01/28/2014   AST 17 01/28/2014   NA 139 01/28/2014   K 3.8 01/28/2014   CL 108 01/28/2014   CREATININE 0.7 01/28/2014   BUN 10 01/28/2014   CO2 19 01/28/2014   TSH 0.57 01/28/2014       Assessment & Plan:   Problem List Items Addressed This Visit    Rash    Rash.  Unclear as to definite etiology.  Feel rash from viral infection.  Question of hand, foot, mouth disease.  Symptoms as outlined.  Itching.  Can complete the augmentin.  Vistaril for the rash and itching.  Remain out of work.  Follow closely.  Notify me if persistent.        Sinusitis - Primary    Being treated with augmentin after evaluation with acute care.        Relevant Medications   amoxicillin-clavulanate (AUGMENTIN) 875-125 MG tablet       [name], MD

## 2015-05-04 NOTE — Assessment & Plan Note (Signed)
Being treated with augmentin after evaluation with acute care.

## 2015-05-04 NOTE — Assessment & Plan Note (Signed)
Rash.  Unclear as to definite etiology.  Feel rash from viral infection.  Question of hand, foot, mouth disease.  Symptoms as outlined.  Itching.  Can complete the augmentin.  Vistaril for the rash and itching.  Remain out of work.  Follow closely.  Notify me if persistent.

## 2015-05-14 ENCOUNTER — Other Ambulatory Visit: Payer: Self-pay

## 2015-05-14 MED ORDER — ESCITALOPRAM OXALATE 20 MG PO TABS: 20.0000 mg | ORAL_TABLET | Freq: Every day | ORAL | Status: DC

## 2015-05-15 ENCOUNTER — Other Ambulatory Visit: Payer: Self-pay | Admitting: Internal Medicine

## 2015-06-06 ENCOUNTER — Encounter: Payer: Self-pay | Admitting: Internal Medicine

## 2015-06-06 NOTE — Telephone Encounter (Signed)
Unable to send in pain medication without evaluating pt.  If she is having increased pain and needing medication, needs evaluation.  If increased pain as described, recommend acute care and then can f/u here after.

## 2015-06-13 ENCOUNTER — Encounter: Payer: Self-pay | Admitting: Internal Medicine

## 2015-06-16 ENCOUNTER — Encounter: Payer: Self-pay | Admitting: Internal Medicine

## 2015-06-16 NOTE — Telephone Encounter (Signed)
Please call and notify pt that I can see work her in at the end of the day on Tuesday (06/14/15) - 4:30.  This may mess her up with her chiropractor appt.  I have a meeting during lunch, so I cannot see her during lunch on Tuesday.  Just let me know if a problem.    Thanks  Dr Nicki Reaper

## 2015-06-17 ENCOUNTER — Ambulatory Visit (INDEPENDENT_AMBULATORY_CARE_PROVIDER_SITE_OTHER): Payer: BC Managed Care – PPO | Admitting: Family Medicine

## 2015-06-17 ENCOUNTER — Encounter: Payer: Self-pay | Admitting: Family Medicine

## 2015-06-17 DIAGNOSIS — M5441 Lumbago with sciatica, right side: Secondary | ICD-10-CM

## 2015-06-17 DIAGNOSIS — M545 Low back pain, unspecified: Secondary | ICD-10-CM | POA: Insufficient documentation

## 2015-06-17 MED ORDER — HYDROCODONE-ACETAMINOPHEN 5-325 MG PO TABS: 1.0000 | ORAL_TABLET | Freq: Three times a day (TID) | ORAL | Status: DC | PRN

## 2015-06-17 MED ORDER — METHYLPREDNISOLONE ACETATE 80 MG/ML IJ SUSP
80.0000 mg | Freq: Once | INTRAMUSCULAR | Status: AC
  Administered 2015-06-17: 80 mg via INTRAMUSCULAR

## 2015-06-17 NOTE — Progress Notes (Signed)
Pre visit review using our clinic review tool, if applicable. No additional management support is needed unless otherwise documented below in the visit note. 

## 2015-06-17 NOTE — Patient Instructions (Signed)
Use the pain medication as needed.  Let me or Dr. Nicki Reaper know if you desire the referral.  Follow up closely with Dr. Nicki Reaper.  Take care  Dr. Lacinda Axon

## 2015-06-17 NOTE — Assessment & Plan Note (Signed)
New problem. Patient was severe right low back pain. Patient showed me the x-ray that was obtained at her chiropractor's office via her phone. There was significant spondylolisthesis noted at L5-S1 (grade 2 versus grade 3). I noted mild degenerative changes in lumbar spine. Patient had a long discussion about treatment options: NSAIDs, steroids, injection therapy, further imaging/referral to neurosurgery. Patient is not interested in surgery at this time. Patient agreed to steroid injection today. I gave her a short course of Vicodin to be used for severe pain. We discussed referral and patient is going to hold off on this at this time. Patient to follow-up with PCP.

## 2015-06-17 NOTE — Progress Notes (Signed)
Subjective:  Patient ID: Ariel Morgan, female    DOB: 01/12/1976  Age: 40 y.o. MRN: [id number]  CC: Back pain  HPI:  40 year old female presents to clinic today for an acute visit with complaint of back pain.  Low back pain  Patient reports that she's had chronic back pain for the past 7 years.  She states that she had worsening of her low back pain in January.  Pain is located in low back on the right side.  She reports radiation down her right leg to the ankle.  Pain is severe and unrelenting.  She has been seeing a chiropractor and has had little improvement with treatment.  He has taken no medication and has not tried any other interventions for this.  She states that the only time the pain is improved as when she's lying down. Worse with activity.  The chiropractor has done an x-ray and has recently told her that he feels like she needs further treatment with possible steroid injection.  She presents today to discuss treatment options.  Social Hx   Social History   Social History  . Marital Status: Married    Spouse Name: N/A  . Number of Children: 3  . Years of Education: N/A   Social History Main Topics  . Smoking status: Former Smoker -- 1.00 packs/day    Types: Cigarettes  . Smokeless tobacco: Never Used  . Alcohol Use: 0.0 oz/week    0 Standard drinks or equivalent per week     Comment: rarely  . Drug Use: No  . Sexual Activity: Not Asked   Other Topics Concern  . None   Social History Narrative   Review of Systems  Constitutional: Negative.   Musculoskeletal: Positive for back pain.   Objective:  BP 124/84 mmHg  Pulse 85  Temp(Src) 98.1 F (36.7 C) (Oral)  Ht 5\' 3"  (1.6 m)  Wt 182 lb 8 oz (82.781 kg)  BMI 32.34 kg/m2  SpO2 98%  BP/Weight 06/17/2015 04/29/2015 [id number]  Systolic BP [id number]  Diastolic BP 84 70 70  Wt. (Lbs) 182.5 190.5 186.13  BMI 32.34 33.75 32.98   Physical Exam  Constitutional: She is oriented to  person, place, and time. She appears well-developed. No distress.  Cardiovascular: Normal rate and regular rhythm.   Pulmonary/Chest: Effort normal and breath sounds normal.  Musculoskeletal:  Low back - mildly tender palpation in the region of the right SI joint. Negative straight leg raise.  Neurological: She is alert and oriented to person, place, and time.  Psychiatric: She has a normal mood and affect.  Vitals reviewed.  Lab Results  Component Value Date   WBC 7.5 01/28/2014   HGB 13.3 01/28/2014   HCT 39.5 01/28/2014   PLT 299.0 01/28/2014   GLUCOSE 90 01/28/2014   CHOL 189 01/28/2014   TRIG 75.0 01/28/2014   HDL 58.60 01/28/2014   LDLDIRECT 132.6 01/29/2013   LDLCALC 115* 01/28/2014   ALT 21 01/28/2014   AST 17 01/28/2014   NA 139 01/28/2014   K 3.8 01/28/2014   CL 108 01/28/2014   CREATININE 0.7 01/28/2014   BUN 10 01/28/2014   CO2 19 01/28/2014   TSH 0.57 01/28/2014    Assessment & Plan:   Problem List Items Addressed This Visit    Low back pain    New problem. Patient was severe right low back pain. Patient showed me the x-ray that was obtained at her chiropractor's office via her phone. There  was significant spondylolisthesis noted at L5-S1 (grade 2 versus grade 3). I noted mild degenerative changes in lumbar spine. Patient had a long discussion about treatment options: NSAIDs, steroids, injection therapy, further imaging/referral to neurosurgery. Patient is not interested in surgery at this time. Patient agreed to steroid injection today. I gave her a short course of Vicodin to be used for severe pain. We discussed referral and patient is going to hold off on this at this time. Patient to follow-up with PCP.      Relevant Medications   HYDROcodone-acetaminophen (NORCO/VICODIN) 5-325 MG tablet   methylPREDNISolone acetate (DEPO-MEDROL) injection 80 mg (Completed)      Meds ordered this encounter  Medications  . HYDROcodone-acetaminophen (NORCO/VICODIN)  5-325 MG tablet    Sig: Take 1 tablet by mouth every 8 (eight) hours as needed for moderate pain.    Dispense:  30 tablet    Refill:  0  . methylPREDNISolone acetate (DEPO-MEDROL) injection 80 mg    Sig:     Follow-up: Encouraged close follow-up with PCP.  [place]

## 2015-07-11 ENCOUNTER — Other Ambulatory Visit: Payer: Self-pay | Admitting: Family Medicine

## 2015-07-14 ENCOUNTER — Other Ambulatory Visit: Payer: Self-pay | Admitting: Internal Medicine

## 2015-07-14 ENCOUNTER — Other Ambulatory Visit: Payer: Self-pay | Admitting: Family Medicine

## 2015-07-14 DIAGNOSIS — M5441 Lumbago with sciatica, right side: Secondary | ICD-10-CM

## 2015-07-14 NOTE — Telephone Encounter (Signed)
If still having increased pain, needs to be evaluated.  I can work her in somewhere if agreeable or can refer to ortho if needed.

## 2015-07-14 NOTE — Telephone Encounter (Signed)
Last refilled 06/2015. Prescribed by Dr Lacinda Axon at her acute visit on 06/30/15. Please advise?

## 2015-07-21 ENCOUNTER — Other Ambulatory Visit: Payer: Self-pay | Admitting: Family Medicine

## 2015-07-21 ENCOUNTER — Other Ambulatory Visit: Payer: Self-pay | Admitting: Internal Medicine

## 2015-07-21 ENCOUNTER — Encounter: Payer: Self-pay | Admitting: Internal Medicine

## 2015-07-21 DIAGNOSIS — M5441 Lumbago with sciatica, right side: Secondary | ICD-10-CM

## 2015-07-21 NOTE — Telephone Encounter (Signed)
See last note.  Needs evaluation if pain is persistent or needs referral to ortho if persistent pain.

## 2015-07-21 NOTE — Telephone Encounter (Signed)
HYDROcodone-acetaminophen (NORCO/VICODIN) 5-325 MG tablet 30 tablet 0 06/17/2015   Please advise?

## 2015-08-29 ENCOUNTER — Other Ambulatory Visit: Payer: Self-pay | Admitting: Internal Medicine

## 2015-09-12 ENCOUNTER — Ambulatory Visit: Payer: BC Managed Care – PPO | Admitting: Internal Medicine

## 2015-12-01 ENCOUNTER — Other Ambulatory Visit: Payer: Self-pay | Admitting: Internal Medicine

## 2015-12-01 NOTE — Telephone Encounter (Signed)
LOV 06/17/2015. Renaldo Fiddler, CMA

## 2015-12-02 NOTE — Telephone Encounter (Signed)
lmtcb to schedule. Renaldo Fiddler, CMA

## 2015-12-02 NOTE — Telephone Encounter (Signed)
Needs appt scheduled.  Once scheduled, ok to refill until appt.

## 2015-12-16 ENCOUNTER — Ambulatory Visit (INDEPENDENT_AMBULATORY_CARE_PROVIDER_SITE_OTHER): Payer: BC Managed Care – PPO | Admitting: Internal Medicine

## 2015-12-16 ENCOUNTER — Encounter: Payer: Self-pay | Admitting: Internal Medicine

## 2015-12-16 DIAGNOSIS — M5441 Lumbago with sciatica, right side: Secondary | ICD-10-CM

## 2015-12-16 DIAGNOSIS — R51 Headache: Secondary | ICD-10-CM

## 2015-12-16 DIAGNOSIS — Z658 Other specified problems related to psychosocial circumstances: Secondary | ICD-10-CM | POA: Diagnosis not present

## 2015-12-16 DIAGNOSIS — Z23 Encounter for immunization: Secondary | ICD-10-CM

## 2015-12-16 DIAGNOSIS — F439 Reaction to severe stress, unspecified: Secondary | ICD-10-CM

## 2015-12-16 DIAGNOSIS — R519 Headache, unspecified: Secondary | ICD-10-CM

## 2015-12-16 DIAGNOSIS — E7800 Pure hypercholesterolemia, unspecified: Secondary | ICD-10-CM | POA: Diagnosis not present

## 2015-12-16 MED ORDER — TIZANIDINE HCL 4 MG PO TABS: 4.0000 mg | ORAL_TABLET | Freq: Every evening | ORAL | 0 refills | Status: DC | PRN

## 2015-12-16 MED ORDER — PENCICLOVIR 1 % EX CREA
1.0000 | TOPICAL_CREAM | Freq: Four times a day (QID) | CUTANEOUS | 0 refills | Status: DC
Start: 2015-12-16 — End: 2017-07-18

## 2015-12-16 MED ORDER — ALPRAZOLAM 0.25 MG PO TABS: 0.2500 mg | ORAL_TABLET | Freq: Every day | ORAL | 0 refills | Status: DC | PRN

## 2015-12-16 NOTE — Progress Notes (Signed)
Pre visit review using our clinic review tool, if applicable. No additional management support is needed unless otherwise documented below in the visit note. 

## 2015-12-16 NOTE — Progress Notes (Signed)
Patient ID: Ariel Morgan, female   DOB: 1975-11-05, 40 y.o.   MRN: [id number]   Subjective:    Patient ID: Ariel Morgan, female    DOB: [birth year], 40 y.o.   MRN: [id number]  HPI  Patient here for a scheduled follow up.  She reports increased stress with her job.  Discussed with her today. On lexapro.  Request refill xanax to have prn.  Also reports persistent back and leg pain.  Limits her activity.  Has been seeing a Restaurant manager, fast food.  Also has tried various medications and has tried exercises.  Persistent issues.  Request referral to ortho.  Has had xray as outlined.  Discussed the possible need for MRI.  No chest pain.  Breathing stable.  Bowels stable.     Past Medical History:  Diagnosis Date  . Anemia   . Frequent headaches    H/O  . GERD (gastroesophageal reflux disease)   . Hypercholesterolemia   . Placenta previa    Past Surgical History:  Procedure Laterality Date  . CESAREAN SECTION  2003 & 2010  . CHOLECYSTECTOMY  2005   Family History  Problem Relation Age of Onset  . Hyperlipidemia Father   . Diabetes Sister   . Diabetes Maternal Grandmother   . Cancer Maternal Grandmother     vaginal  . Cirrhosis Maternal Grandmother   . Colon polyps Maternal Grandmother    Social History   Social History  . Marital status: Married    Spouse name: N/A  . Number of children: 3  . Years of education: N/A   Social History Main Topics  . Smoking status: Former Smoker    Packs/day: 1.00    Types: Cigarettes  . Smokeless tobacco: Never Used  . Alcohol use 0.0 oz/week     Comment: rarely  . Drug use: No  . Sexual activity: Not Asked   Other Topics Concern  . None   Social History Narrative  . None    Outpatient Encounter Prescriptions as of 12/16/2015  Medication Sig  . ALPRAZolam (XANAX) 0.25 MG tablet Take 1 tablet (0.25 mg total) by mouth daily as needed for anxiety.  Marland Kitchen escitalopram (LEXAPRO) 20 MG tablet TAKE 1 TABLET BY MOUTH EVERY DAY  . escitalopram  (LEXAPRO) 20 MG tablet TAKE 1 TABLET (20 MG TOTAL) BY MOUTH DAILY.  Marland Kitchen penciclovir (DENAVIR) 1 % cream Apply 1 application topically 4 (four) times daily.  . SUMAtriptan Succinate (IMITREX PO) Take by mouth as needed.  . [DISCONTINUED] ALPRAZolam (XANAX) 0.25 MG tablet Take 1 tablet (0.25 mg total) by mouth daily as needed for anxiety.  . [DISCONTINUED] amoxicillin-clavulanate (AUGMENTIN) 875-125 MG tablet   . [DISCONTINUED] HYDROcodone-acetaminophen (NORCO/VICODIN) 5-325 MG tablet Take 1 tablet by mouth every 8 (eight) hours as needed for moderate pain.  . [DISCONTINUED] hydrOXYzine (ATARAX/VISTARIL) 10 MG tablet Take 1 tablet (10 mg total) by mouth 3 (three) times daily as needed.  . [DISCONTINUED] penciclovir (DENAVIR) 1 % cream Apply 1 application topically 4 (four) times daily.  . [DISCONTINUED] topiramate (TOPAMAX) 50 MG tablet Take 50 mg by mouth 2 (two) times daily.  Marland Kitchen tiZANidine (ZANAFLEX) 4 MG tablet Take 1 tablet (4 mg total) by mouth at bedtime as needed for muscle spasms.   No facility-administered encounter medications on file as of 12/16/2015.     Review of Systems  Constitutional: Negative for appetite change and unexpected weight change.  HENT: Negative for congestion and sinus pressure.   Respiratory: Negative for cough, chest tightness and  shortness of breath.   Cardiovascular: Negative for chest pain, palpitations and leg swelling.  Gastrointestinal: Negative for abdominal pain, diarrhea, nausea and vomiting.  Genitourinary: Negative for difficulty urinating and dysuria.  Musculoskeletal: Positive for back pain.       Pain extending down her leg.    Skin: Negative for color change and rash.  Neurological: Positive for headaches. Negative for dizziness and light-headedness.  Psychiatric/Behavioral: Negative for agitation.       Increased stress as outlined.         Objective:    Physical Exam  Constitutional: She appears well-developed and well-nourished. No  distress.  HENT:  Nose: Nose normal.  Mouth/Throat: Oropharynx is clear and moist.  Neck: Neck supple. No thyromegaly present.  Cardiovascular: Normal rate and regular rhythm.   Pulmonary/Chest: Breath sounds normal. No respiratory distress. She has no wheezes.  Abdominal: Soft. Bowel sounds are normal. There is no tenderness.  Musculoskeletal: She exhibits no edema or tenderness.  Increased pain with straight leg raise.    Lymphadenopathy:    She has no cervical adenopathy.  Skin: No rash noted. No erythema.  Psychiatric: She has a normal mood and affect. Her behavior is normal.    BP 110/68   Pulse 87   Temp 98.3 F (36.8 C) (Oral)   Ht 5\' 3"  (1.6 m)   Wt 187 lb (84.8 kg)   SpO2 98%   BMI 33.13 kg/m  Wt Readings from Last 3 Encounters:  12/16/15 187 lb (84.8 kg)  06/17/15 182 lb 8 oz (82.8 kg)  04/29/15 190 lb 8 oz (86.4 kg)     Lab Results  Component Value Date   WBC 7.5 01/28/2014   HGB 13.3 01/28/2014   HCT 39.5 01/28/2014   PLT 299.0 01/28/2014   GLUCOSE 90 01/28/2014   CHOL 189 01/28/2014   TRIG 75.0 01/28/2014   HDL 58.60 01/28/2014   LDLDIRECT 132.6 01/29/2013   LDLCALC 115 (H) 01/28/2014   ALT 21 01/28/2014   AST 17 01/28/2014   NA 139 01/28/2014   K 3.8 01/28/2014   CL 108 01/28/2014   CREATININE 0.7 01/28/2014   BUN 10 01/28/2014   CO2 19 01/28/2014   TSH 0.57 01/28/2014       Assessment & Plan:   Problem List Items Addressed This Visit    Headache    Takes imitrex prn.  Follow.       Relevant Medications   tiZANidine (ZANAFLEX) 4 MG tablet   Hypercholesterolemia    Low cholesterol diet and exercise.  Follow lipid panel.        Low back pain    With persistent increased back pain and pain down her leg.  Xray per report revealed significant spondylolisthesis noted at L5-S1 and mild degenerative changes in lumbar spine.  Has tried conservative measures.  Has tried medication, chiropractor and exercises.  Persistent pain.  Limits her  activity.  Gave her a rx for tizanidine.  Refer to ortho for further evaluation and to discuss treatment options.        Relevant Medications   tiZANidine (ZANAFLEX) 4 MG tablet   Stress    On lexapro.  Requested a refill on alprazolam.  Refilled.  Overall handling things relatively well.  Follow.         Other Visit Diagnoses    Encounter for immunization       Relevant Orders   Flu Vaccine QUAD 36+ mos IM (Completed)       Azelea Seguin,  [name], MD

## 2015-12-21 ENCOUNTER — Encounter: Payer: Self-pay | Admitting: Internal Medicine

## 2015-12-21 DIAGNOSIS — M5440 Lumbago with sciatica, unspecified side: Secondary | ICD-10-CM

## 2015-12-21 NOTE — Assessment & Plan Note (Signed)
On lexapro.  Requested a refill on alprazolam.  Refilled.  Overall handling things relatively well.  Follow.

## 2015-12-21 NOTE — Assessment & Plan Note (Signed)
With persistent increased back pain and pain down her leg.  Xray per report revealed significant spondylolisthesis noted at L5-S1 and mild degenerative changes in lumbar spine.  Has tried conservative measures.  Has tried medication, chiropractor and exercises.  Persistent pain.  Limits her activity.  Gave her a rx for tizanidine.  Refer to ortho for further evaluation and to discuss treatment options.

## 2015-12-21 NOTE — Assessment & Plan Note (Signed)
Takes imitrex prn.  Follow.

## 2015-12-21 NOTE — Assessment & Plan Note (Signed)
Low cholesterol diet and exercise.  Follow lipid panel.   

## 2015-12-22 NOTE — Telephone Encounter (Signed)
No order/referral for orthopedics. Please advise.

## 2015-12-23 NOTE — Telephone Encounter (Signed)
Order placed for referral.  

## 2016-01-01 ENCOUNTER — Other Ambulatory Visit: Payer: Self-pay

## 2016-01-01 MED ORDER — ESCITALOPRAM OXALATE 20 MG PO TABS: 20.0000 mg | ORAL_TABLET | Freq: Every day | ORAL | 1 refills | Status: DC

## 2016-01-01 NOTE — Progress Notes (Signed)
[facility]ill request from [facility] for [facility] days, sent to pharmacy

## 2016-01-02 ENCOUNTER — Other Ambulatory Visit: Payer: Self-pay

## 2016-01-02 MED ORDER — ESCITALOPRAM OXALATE 20 MG PO TABS: 20.0000 mg | ORAL_TABLET | Freq: Every day | ORAL | 2 refills | Status: DC

## 2016-01-02 NOTE — Progress Notes (Signed)
Rx request for 90 days form [facility], approved

## 2016-01-24 ENCOUNTER — Other Ambulatory Visit: Payer: Self-pay | Admitting: Internal Medicine

## 2016-01-26 ENCOUNTER — Other Ambulatory Visit: Payer: Self-pay | Admitting: Internal Medicine

## 2016-01-26 NOTE — Telephone Encounter (Signed)
Ok to refill xanax x 1, but please let pt know if increased problems with anxiety - needs f/u appt to discussed treatment.

## 2016-01-26 NOTE — Telephone Encounter (Signed)
LADT FILLED 12/16/15 20 0RF

## 2016-01-26 NOTE — Telephone Encounter (Signed)
Last filled 12/16/15 15 0 rf

## 2016-01-27 NOTE — Telephone Encounter (Signed)
Printed, will fax once signed.   

## 2016-01-27 NOTE — Telephone Encounter (Signed)
lmtrc

## 2016-01-27 NOTE — Telephone Encounter (Signed)
FYI Patient was informed of Dr.Scott advised message.

## 2016-02-13 ENCOUNTER — Other Ambulatory Visit: Payer: Self-pay | Admitting: Internal Medicine

## 2016-02-13 NOTE — Telephone Encounter (Signed)
Pt called returning your call. Thank you!  Call pt @ [phone]

## 2016-02-13 NOTE — Telephone Encounter (Signed)
Last filled 01/26/16 20 0 rf

## 2016-02-13 NOTE — Telephone Encounter (Signed)
I am ok to refill x 1, but if she is having persistent increased pain or problems, needs to be reevaluated.

## 2016-02-13 NOTE — Telephone Encounter (Signed)
Patient notified

## 2016-02-13 NOTE — Telephone Encounter (Signed)
lmtrc

## 2016-02-23 ENCOUNTER — Other Ambulatory Visit: Payer: Self-pay | Admitting: Internal Medicine

## 2016-03-17 ENCOUNTER — Encounter: Payer: Self-pay | Admitting: Family

## 2016-03-17 ENCOUNTER — Ambulatory Visit (INDEPENDENT_AMBULATORY_CARE_PROVIDER_SITE_OTHER): Payer: BC Managed Care – PPO | Admitting: Family

## 2016-03-17 VITALS — BP 114/80 | HR 102 | Temp 97.8°F | Ht 63.0 in | Wt 181.2 lb

## 2016-03-17 DIAGNOSIS — G8929 Other chronic pain: Secondary | ICD-10-CM

## 2016-03-17 DIAGNOSIS — M5441 Lumbago with sciatica, right side: Secondary | ICD-10-CM

## 2016-03-17 MED ORDER — PREDNISONE 10 MG PO TABS: ORAL_TABLET | ORAL | 0 refills | Status: DC

## 2016-03-17 NOTE — Assessment & Plan Note (Addendum)
Acute on chronic. Unable to view MRI from [facility]. Symptoms consistent with nerve impingement. Trial of prednisone taper. If fails, will consider short trial of ultram. Return precautions

## 2016-03-17 NOTE — Progress Notes (Signed)
Pre visit review using our clinic review tool, if applicable. No additional management support is needed unless otherwise documented below in the visit note. 

## 2016-03-17 NOTE — Patient Instructions (Signed)
Trial prednisone  Let me know if not better
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
## 2016-03-22 ENCOUNTER — Other Ambulatory Visit: Payer: Self-pay | Admitting: Internal Medicine

## 2016-03-23 NOTE — Telephone Encounter (Signed)
Is it ok to refill this?  

## 2016-03-25 ENCOUNTER — Encounter: Payer: Self-pay | Admitting: Internal Medicine

## 2016-03-25 ENCOUNTER — Ambulatory Visit (INDEPENDENT_AMBULATORY_CARE_PROVIDER_SITE_OTHER): Payer: BC Managed Care – PPO | Admitting: Internal Medicine

## 2016-03-25 ENCOUNTER — Other Ambulatory Visit (HOSPITAL_COMMUNITY)
Admission: RE | Admit: 2016-03-25 | Discharge: 2016-03-25 | Disposition: A | Discharge status: Home / Self Care | Payer: BC Managed Care – PPO | Source: Ambulatory Visit | Attending: Internal Medicine | Admitting: Internal Medicine

## 2016-03-25 VITALS — BP 114/68 | HR 82 | Temp 97.7°F | Ht 63.0 in | Wt 181.8 lb

## 2016-03-25 DIAGNOSIS — Z01419 Encounter for gynecological examination (general) (routine) without abnormal findings: Secondary | ICD-10-CM | POA: Insufficient documentation

## 2016-03-25 DIAGNOSIS — E7800 Pure hypercholesterolemia, unspecified: Secondary | ICD-10-CM | POA: Diagnosis not present

## 2016-03-25 DIAGNOSIS — Z0000 Encounter for general adult medical examination without abnormal findings: Secondary | ICD-10-CM

## 2016-03-25 DIAGNOSIS — Z113 Encounter for screening for infections with a predominantly sexual mode of transmission: Secondary | ICD-10-CM | POA: Diagnosis present

## 2016-03-25 DIAGNOSIS — Z1151 Encounter for screening for human papillomavirus (HPV): Secondary | ICD-10-CM | POA: Diagnosis not present

## 2016-03-25 DIAGNOSIS — F439 Reaction to severe stress, unspecified: Secondary | ICD-10-CM

## 2016-03-25 DIAGNOSIS — Z124 Encounter for screening for malignant neoplasm of cervix: Secondary | ICD-10-CM | POA: Diagnosis not present

## 2016-03-25 DIAGNOSIS — R519 Headache, unspecified: Secondary | ICD-10-CM

## 2016-03-25 DIAGNOSIS — N898 Other specified noninflammatory disorders of vagina: Secondary | ICD-10-CM

## 2016-03-25 DIAGNOSIS — R51 Headache: Secondary | ICD-10-CM

## 2016-03-25 DIAGNOSIS — G8929 Other chronic pain: Secondary | ICD-10-CM

## 2016-03-25 DIAGNOSIS — M5441 Lumbago with sciatica, right side: Secondary | ICD-10-CM

## 2016-03-25 MED ORDER — ETODOLAC 400 MG PO TABS: 400.0000 mg | ORAL_TABLET | Freq: Two times a day (BID) | ORAL | 0 refills | Status: DC | PRN

## 2016-03-25 NOTE — Progress Notes (Signed)
Pre visit review using our clinic review tool, if applicable. No additional management support is needed unless otherwise documented below in the visit note. 

## 2016-03-25 NOTE — Progress Notes (Signed)
Patient ID: Ariel Morgan, female   DOB: 1975/04/07, 40 y.o.   MRN: [id number]   Subjective:    Patient ID: Ariel Morgan, female    DOB: 1975/04/29, 40 y.o.   MRN: [id number]  HPI  Patient here for her physical exam.  She has been having increased back pain and pain down her leg.  Has seen ortho.  Taking lyrica.  Also takes xanaflex at night.  Still with increased pain.  Planning for surgery, but she wants to wait until school is out.  Headaches are not occurring as often.  Increased stress with her work and with the pain.  Overall she feels she is handling things relatively well.  No chest pain.  No sob.  No acid reflux.  No abdominal pain or cramping.  Bowels stable.  She does report some vaginal irritation.     Past Medical History:  Diagnosis Date  . Anemia   . Frequent headaches    H/O  . GERD (gastroesophageal reflux disease)   . Hypercholesterolemia   . Placenta previa    Past Surgical History:  Procedure Laterality Date  . CESAREAN SECTION  2003 & 2010  . CHOLECYSTECTOMY  2005   Family History  Problem Relation Age of Onset  . Hyperlipidemia Father   . Diabetes Sister   . Diabetes Maternal Grandmother   . Cancer Maternal Grandmother     vaginal  . Cirrhosis Maternal Grandmother   . Colon polyps Maternal Grandmother    Social History   Social History  . Marital status: Married    Spouse name: N/A  . Number of children: 3  . Years of education: N/A   Social History Main Topics  . Smoking status: Former Smoker    Packs/day: 1.00    Types: Cigarettes  . Smokeless tobacco: Never Used  . Alcohol use 0.0 oz/week     Comment: rarely  . Drug use: No  . Sexual activity: Not Asked   Other Topics Concern  . None   Social History Narrative  . None    Outpatient Encounter Prescriptions as of 03/25/2016  Medication Sig  . ALPRAZolam (XANAX) 0.25 MG tablet TAKE 1 TABLET BY MOUTH DAILY AS NEEDED FOR ANXIETY  . escitalopram (LEXAPRO) 20 MG tablet Take 1  tablet (20 mg total) by mouth daily.  Marland Kitchen escitalopram (LEXAPRO) 20 MG tablet Take 1 tablet (20 mg total) by mouth daily.  Marland Kitchen escitalopram (LEXAPRO) 20 MG tablet TAKE 1 TABLET (20 MG TOTAL) BY MOUTH DAILY.  Marland Kitchen penciclovir (DENAVIR) 1 % cream Apply 1 application topically 4 (four) times daily.  . pregabalin (LYRICA) 75 MG capsule Take 70 mg by mouth 2 (two) times daily.  . SUMAtriptan Succinate (IMITREX PO) Take by mouth as needed.  Marland Kitchen tiZANidine (ZANAFLEX) 4 MG tablet TAKE 1 TABLET (4 MG TOTAL) BY MOUTH AT BEDTIME AS NEEDED FOR MUSCLE SPASMS.  Marland Kitchen etodolac (LODINE) 400 MG tablet Take 1 tablet (400 mg total) by mouth 2 (two) times daily as needed.  . [DISCONTINUED] predniSONE (DELTASONE) 10 MG tablet Take 4 tablets ( total 40 mg) by mouth for 2 days; take 3 tablets ( total 30 mg) by mouth for 2 days; take 2 tablets (total 20mg ) by mouth for 2 days; then take 1 tablet ( total 10mg ) by mouth for 2 days. (Patient not taking: Reported on 03/25/2016)   No facility-administered encounter medications on file as of 03/25/2016.     Review of Systems  Constitutional: Negative for appetite change  and unexpected weight change.  HENT: Negative for congestion and sinus pressure.   Eyes: Negative for pain and visual disturbance.  Respiratory: Negative for cough, chest tightness and shortness of breath.   Cardiovascular: Negative for chest pain, palpitations and leg swelling.  Gastrointestinal: Negative for abdominal pain, diarrhea, nausea and vomiting.  Genitourinary: Negative for difficulty urinating and dysuria.       Vaginal irritation as outlined.    Musculoskeletal: Positive for back pain. Negative for joint swelling.       Pain radiating down her leg.    Skin: Negative for color change and rash.  Neurological: Negative for dizziness and headaches.  Hematological: Negative for adenopathy. Does not bruise/bleed easily.  Psychiatric/Behavioral: Negative for agitation and dysphoric mood.       Objective:       Blood pressure rechecked by me:  108/68  Physical Exam  Constitutional: She is oriented to person, place, and time. She appears well-developed and well-nourished. No distress.  HENT:  Nose: Nose normal.  Mouth/Throat: Oropharynx is clear and moist.  Eyes: Right eye exhibits no discharge. Left eye exhibits no discharge. No scleral icterus.  Neck: Neck supple. No thyromegaly present.  Cardiovascular: Normal rate and regular rhythm.   Pulmonary/Chest: Breath sounds normal. No accessory muscle usage. No tachypnea. No respiratory distress. She has no decreased breath sounds. She has no wheezes. She has no rhonchi. Right breast exhibits no inverted nipple, no mass, no nipple discharge and no tenderness (no axillary adenopathy). Left breast exhibits no inverted nipple, no mass, no nipple discharge and no tenderness (no axilarry adenopathy).  Abdominal: Soft. Bowel sounds are normal. There is no tenderness.  Genitourinary:  Genitourinary Comments: Normal external genitalia.  Vaginal vault without lesions.  Cervix identified.  Pap smear performed.  discharge present.  KOH/wet prep sent.  Could not appreciate any adnexal masses or tenderness.    Musculoskeletal: She exhibits no edema or tenderness.  Lymphadenopathy:    She has no cervical adenopathy.  Neurological: She is alert and oriented to person, place, and time.  Skin: Skin is warm. No rash noted. No erythema.  Psychiatric: She has a normal mood and affect. Her behavior is normal.    BP 114/68   Pulse 82   Temp 97.7 F (36.5 C) (Oral)   Ht 5\' 3"  (1.6 m)   Wt 181 lb 12.8 oz (82.5 kg)   SpO2 98%   BMI 32.20 kg/m  Wt Readings from Last 3 Encounters:  03/25/16 181 lb 12.8 oz (82.5 kg)  03/17/16 181 lb 3.2 oz (82.2 kg)  12/16/15 187 lb (84.8 kg)     Lab Results  Component Value Date   WBC 7.5 01/28/2014   HGB 13.3 01/28/2014   HCT 39.5 01/28/2014   PLT 299.0 01/28/2014   GLUCOSE 90 01/28/2014   CHOL 189 01/28/2014   TRIG  75.0 01/28/2014   HDL 58.60 01/28/2014   LDLDIRECT 132.6 01/29/2013   LDLCALC 115 (H) 01/28/2014   ALT 21 01/28/2014   AST 17 01/28/2014   NA 139 01/28/2014   K 3.8 01/28/2014   CL 108 01/28/2014   CREATININE 0.7 01/28/2014   BUN 10 01/28/2014   CO2 19 01/28/2014   TSH 0.57 01/28/2014       Assessment & Plan:   Problem List Items Addressed This Visit    Headache    Headaches not as often.  Follow.        Relevant Medications   etodolac (LODINE) 400 MG tablet  Health care maintenance    Physical today 03/31/16.  PAP 03/31/16.  Schedule mammogram.        Hypercholesterolemia    Low cholesterol diet and exercise.  Follow lipid panel.        Relevant Orders   CBC with Differential/Platelet   Comprehensive metabolic panel   TSH   Lipid panel   Low back pain    Persistent increased pain.  Has seen ortho.  On lyrica and taking muscle relaxer at night.  Feels needs something more.  Able to take antiinflammatories.  Will try lodine as directed.  Follow.  F/u with ortho as discussed.  Planning for surgery.        Relevant Medications   etodolac (LODINE) 400 MG tablet   Stress    Increased stress as outlned.  On lexapro.  Doing well with this medication.  Does not feel needs any further intervention.  Follow.         Other Visit Diagnoses    Vaginal discharge    -  Primary   KOH/wet prep obtained.  await results for treatment.     Relevant Orders   Cervicovaginal ancillary only (Completed)   Pap smear for cervical cancer screening       Relevant Orders   Cytology - PAP   Routine general medical examination at a health care facility           [name], MD

## 2016-03-30 ENCOUNTER — Encounter: Payer: Self-pay | Admitting: Internal Medicine

## 2016-03-31 ENCOUNTER — Encounter: Payer: Self-pay | Admitting: Internal Medicine

## 2016-03-31 ENCOUNTER — Telehealth: Payer: Self-pay | Admitting: *Deleted

## 2016-03-31 ENCOUNTER — Other Ambulatory Visit: Payer: Self-pay | Admitting: Internal Medicine

## 2016-03-31 LAB — CERVICOVAGINAL ANCILLARY ONLY: Wet Prep (BD Affirm): POSITIVE — AB

## 2016-03-31 LAB — CYTOLOGY - PAP
Diagnosis: NEGATIVE
HPV (WINDOPATH): NOT DETECTED

## 2016-03-31 MED ORDER — METRONIDAZOLE 0.75 % VA GEL: 1.0000 | Freq: Two times a day (BID) | VAGINAL | 0 refills | Status: DC

## 2016-03-31 NOTE — Assessment & Plan Note (Signed)
Headaches not as often.  Follow.

## 2016-03-31 NOTE — Assessment & Plan Note (Signed)
Persistent increased pain.  Has seen ortho.  On lyrica and taking muscle relaxer at night.  Feels needs something more.  Able to take antiinflammatories.  Will try lodine as directed.  Follow.  F/u with ortho as discussed.  Planning for surgery.

## 2016-03-31 NOTE — Assessment & Plan Note (Signed)
Low cholesterol diet and exercise.  Follow lipid panel.   

## 2016-03-31 NOTE — Progress Notes (Signed)
rx for [facility]rogel [facility]

## 2016-03-31 NOTE — Assessment & Plan Note (Signed)
Increased stress as outlned.  On lexapro.  Doing well with this medication.  Does not feel needs any further intervention.  Follow.

## 2016-03-31 NOTE — Assessment & Plan Note (Signed)
Physical today 03/31/16.  PAP 03/31/16.  Schedule mammogram.

## 2016-03-31 NOTE — Telephone Encounter (Signed)
Spoke with patient. See result note.  

## 2016-03-31 NOTE — Progress Notes (Signed)
rx sent in to [facility].  Will need to cancel rx sent in to [facility].

## 2016-03-31 NOTE — Telephone Encounter (Signed)
Patient requested a call , she as questions in reference to her pap results  Pt contact [phone]

## 2016-04-02 ENCOUNTER — Other Ambulatory Visit: Payer: BC Managed Care – PPO

## 2016-04-20 ENCOUNTER — Other Ambulatory Visit: Payer: Self-pay | Admitting: Internal Medicine

## 2016-04-22 ENCOUNTER — Other Ambulatory Visit: Payer: Self-pay | Admitting: Internal Medicine

## 2016-04-23 ENCOUNTER — Encounter: Payer: Self-pay | Admitting: Internal Medicine

## 2016-04-23 ENCOUNTER — Ambulatory Visit (INDEPENDENT_AMBULATORY_CARE_PROVIDER_SITE_OTHER): Payer: BC Managed Care – PPO | Admitting: Internal Medicine

## 2016-04-23 VITALS — BP 116/68 | HR 80 | Temp 98.2°F | Ht 63.0 in | Wt 187.4 lb

## 2016-04-23 DIAGNOSIS — R300 Dysuria: Secondary | ICD-10-CM

## 2016-04-23 LAB — POCT URINALYSIS DIPSTICK
Bilirubin, UA: NEGATIVE
GLUCOSE UA: NEGATIVE
Ketones, UA: NEGATIVE
Nitrite, UA: NEGATIVE
PROTEIN UA: 30
SPEC GRAV UA: 1.015
UROBILINOGEN UA: 1
pH, UA: 8.5

## 2016-04-23 MED ORDER — CIPROFLOXACIN HCL 500 MG PO TABS: 500.0000 mg | ORAL_TABLET | Freq: Two times a day (BID) | ORAL | 0 refills | Status: DC

## 2016-04-23 MED ORDER — NITROFURANTOIN MONOHYD MACRO 100 MG PO CAPS: 100.0000 mg | ORAL_CAPSULE | Freq: Two times a day (BID) | ORAL | 0 refills | Status: DC

## 2016-04-23 NOTE — Progress Notes (Signed)
Patient ID: Ariel Morgan, female   DOB: May 24, 1975, 41 y.o.   MRN: [id number]   Subjective:    Patient ID: Ariel Morgan, female    DOB: September 20, 1975, 41 y.o.   MRN: [id number]  HPI  Patient here as a work in with concerns regarding dysuria. States symptoms started several days ago.  No fever.  Increased frequency and dysuria.  Feels similar to previous uti's.  Some nausea and previous emesis.  No vomiting today.  Has been drinking.  No vaginal itching or burning.  No diarrhea.    Past Medical History:  Diagnosis Date  . Anemia   . Frequent headaches    H/O  . GERD (gastroesophageal reflux disease)   . Hypercholesterolemia   . Placenta previa    Past Surgical History:  Procedure Laterality Date  . CESAREAN SECTION  2003 & 2010  . CHOLECYSTECTOMY  2005   Family History  Problem Relation Age of Onset  . Hyperlipidemia Father   . Diabetes Sister   . Diabetes Maternal Grandmother   . Cancer Maternal Grandmother     vaginal  . Cirrhosis Maternal Grandmother   . Colon polyps Maternal Grandmother    Social History   Social History  . Marital status: Married    Spouse name: N/A  . Number of children: 3  . Years of education: N/A   Social History Main Topics  . Smoking status: Former Smoker    Packs/day: 1.00    Types: Cigarettes  . Smokeless tobacco: Never Used  . Alcohol use 0.0 oz/week     Comment: rarely  . Drug use: No  . Sexual activity: Not Asked   Other Topics Concern  . None   Social History Narrative  . None    Outpatient Encounter Prescriptions as of 04/23/2016  Medication Sig  . ALPRAZolam (XANAX) 0.25 MG tablet TAKE 1 TABLET BY MOUTH DAILY AS NEEDED FOR ANXIETY  . escitalopram (LEXAPRO) 20 MG tablet Take 1 tablet (20 mg total) by mouth daily.  Marland Kitchen escitalopram (LEXAPRO) 20 MG tablet Take 1 tablet (20 mg total) by mouth daily.  Marland Kitchen escitalopram (LEXAPRO) 20 MG tablet TAKE 1 TABLET (20 MG TOTAL) BY MOUTH DAILY.  Marland Kitchen etodolac (LODINE) 400 MG tablet  Take 1 tablet (400 mg total) by mouth 2 (two) times daily as needed.  . penciclovir (DENAVIR) 1 % cream Apply 1 application topically 4 (four) times daily.  . pregabalin (LYRICA) 75 MG capsule Take 70 mg by mouth 2 (two) times daily.  . SUMAtriptan Succinate (IMITREX PO) Take by mouth as needed.  . [DISCONTINUED] metroNIDAZOLE (METROGEL VAGINAL) 0.75 % vaginal gel Place 1 Applicatorful vaginally 2 (two) times daily. One applicator bid for 5 days.  . [DISCONTINUED] tiZANidine (ZANAFLEX) 4 MG tablet TAKE 1 TABLET (4 MG TOTAL) BY MOUTH AT BEDTIME AS NEEDED FOR MUSCLE SPASMS.  . nitrofurantoin, macrocrystal-monohydrate, (MACROBID) 100 MG capsule Take 1 capsule (100 mg total) by mouth 2 (two) times daily.  . [DISCONTINUED] ciprofloxacin (CIPRO) 500 MG tablet Take 1 tablet (500 mg total) by mouth 2 (two) times daily.   No facility-administered encounter medications on file as of 04/23/2016.     Review of Systems  Constitutional: Negative for fever and unexpected weight change.  Gastrointestinal: Positive for nausea and vomiting. Negative for diarrhea.       Some suprapubic pressure.    Genitourinary: Positive for dysuria and frequency.  Musculoskeletal: Negative for back pain and myalgias.  Skin: Negative for color change and rash.  Neurological: Negative for light-headedness and headaches.       Objective:    Physical Exam  Constitutional: She appears well-developed and well-nourished. No distress.  Cardiovascular: Normal rate and regular rhythm.   Pulmonary/Chest: Breath sounds normal. No respiratory distress. She has no wheezes.  Abdominal: Soft. Bowel sounds are normal. There is no tenderness.  Musculoskeletal: She exhibits no edema or tenderness.  No CVA tenderness.      BP 116/68   Pulse 80   Temp 98.2 F (36.8 C) (Oral)   Ht 5\' 3"  (1.6 m)   Wt 187 lb 6.4 oz (85 kg)   SpO2 98%   BMI 33.20 kg/m  Wt Readings from Last 3 Encounters:  04/23/16 187 lb 6.4 oz (85 kg)  03/25/16  181 lb 12.8 oz (82.5 kg)  03/17/16 181 lb 3.2 oz (82.2 kg)     Lab Results  Component Value Date   WBC 7.5 01/28/2014   HGB 13.3 01/28/2014   HCT 39.5 01/28/2014   PLT 299.0 01/28/2014   GLUCOSE 90 01/28/2014   CHOL 189 01/28/2014   TRIG 75.0 01/28/2014   HDL 58.60 01/28/2014   LDLDIRECT 132.6 01/29/2013   LDLCALC 115 (H) 01/28/2014   ALT 21 01/28/2014   AST 17 01/28/2014   NA 139 01/28/2014   K 3.8 01/28/2014   CL 108 01/28/2014   CREATININE 0.7 01/28/2014   BUN 10 01/28/2014   CO2 19 01/28/2014   TSH 0.57 01/28/2014       Assessment & Plan:   Problem List Items Addressed This Visit    None    Visit Diagnoses    Dysuria    -  Primary   symptoms as outlined.  feels c/w previous infections.  send culture.  treat with macrobid.  stay hydrated.  follow.     Relevant Orders   POCT Urinalysis Dipstick (Completed)   CULTURE, URINE COMPREHENSIVE (Completed)       [name], MD

## 2016-04-23 NOTE — Telephone Encounter (Signed)
Please advise on refill.

## 2016-04-23 NOTE — Progress Notes (Signed)
Pre visit review using our clinic review tool, if applicable. No additional management support is needed unless otherwise documented below in the visit note. 

## 2016-04-25 ENCOUNTER — Encounter: Payer: Self-pay | Admitting: Internal Medicine

## 2016-04-25 LAB — CULTURE, URINE COMPREHENSIVE

## 2016-04-26 ENCOUNTER — Other Ambulatory Visit: Payer: BC Managed Care – PPO

## 2016-04-26 ENCOUNTER — Encounter: Payer: Self-pay | Admitting: Internal Medicine

## 2016-04-28 NOTE — Telephone Encounter (Signed)
Please call pt and see if symptoms improved initially with the abx.  If so, then see if she can come in and give another urine sample - just to see if infection has cleared.  She can come in and leave a sample as long as no other acute problems.  Also, has she tried anything for spasms of bladder?  If no, may be able to get her something called in for that as well - while waiting on the culture results.  Let me know if coming in and I will place the order for the urinalysis and the culture.  Thanks.

## 2016-05-05 NOTE — Telephone Encounter (Signed)
Left message to call on cell phone  

## 2016-05-17 ENCOUNTER — Other Ambulatory Visit: Payer: Self-pay | Admitting: Internal Medicine

## 2016-05-26 ENCOUNTER — Encounter: Payer: Self-pay | Admitting: Internal Medicine

## 2016-05-26 NOTE — Telephone Encounter (Signed)
I will ok early refill x 1 for lexapro (please notify pharmacy).

## 2016-06-09 ENCOUNTER — Other Ambulatory Visit: Payer: Self-pay | Admitting: Internal Medicine

## 2016-06-10 ENCOUNTER — Other Ambulatory Visit: Payer: Self-pay | Admitting: Internal Medicine

## 2016-06-29 ENCOUNTER — Ambulatory Visit: Payer: BC Managed Care – PPO | Admitting: Internal Medicine

## 2016-07-09 ENCOUNTER — Other Ambulatory Visit: Payer: Self-pay | Admitting: Internal Medicine

## 2016-07-09 NOTE — Telephone Encounter (Signed)
Lexapro was last refilled on 06/09/16, Ok to refill?

## 2016-07-10 NOTE — Telephone Encounter (Signed)
rx ok'd for refill lexapro #30 with one refill.

## 2016-08-17 ENCOUNTER — Other Ambulatory Visit: Payer: Self-pay | Admitting: Internal Medicine

## 2016-08-17 NOTE — Telephone Encounter (Signed)
Last OV was 04/24/16 and last refill was 04/25/16, please advise for refill, thanks

## 2016-08-18 NOTE — Telephone Encounter (Signed)
I have ok'd refill for her muscle relaxer.  Per previous note, she was going to f/u with ortho at end of school year.  Please confirm with pt, planning to f/u with ortho for persistent problems with her back.

## 2016-08-18 NOTE — Telephone Encounter (Signed)
Confirmed thanks.

## 2016-08-26 ENCOUNTER — Other Ambulatory Visit: Payer: Self-pay | Admitting: Internal Medicine

## 2016-09-01 ENCOUNTER — Encounter: Payer: Self-pay | Admitting: Internal Medicine
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
## 2016-09-03 ENCOUNTER — Ambulatory Visit: Payer: BC Managed Care – PPO | Admitting: Family Medicine

## 2016-09-29 ENCOUNTER — Other Ambulatory Visit: Payer: Self-pay | Admitting: Internal Medicine

## 2016-10-17 ENCOUNTER — Other Ambulatory Visit: Payer: Self-pay | Admitting: Internal Medicine

## 2016-10-19 MED ORDER — TIZANIDINE HCL 4 MG PO TABS: 4.0000 mg | ORAL_TABLET | Freq: Every evening | ORAL | 0 refills | Status: DC | PRN

## 2016-11-18 ENCOUNTER — Other Ambulatory Visit: Payer: Self-pay | Admitting: Internal Medicine

## 2016-12-09 ENCOUNTER — Encounter: Payer: Self-pay | Admitting: Family

## 2016-12-09 ENCOUNTER — Ambulatory Visit (INDEPENDENT_AMBULATORY_CARE_PROVIDER_SITE_OTHER): Payer: BC Managed Care – PPO | Admitting: Family

## 2016-12-09 VITALS — BP 110/74 | HR 84 | Temp 98.4°F | Ht 62.0 in | Wt 192.0 lb

## 2016-12-09 DIAGNOSIS — M5441 Lumbago with sciatica, right side: Secondary | ICD-10-CM | POA: Diagnosis not present

## 2016-12-09 DIAGNOSIS — F411 Generalized anxiety disorder: Secondary | ICD-10-CM

## 2016-12-09 DIAGNOSIS — Z23 Encounter for immunization: Secondary | ICD-10-CM

## 2016-12-09 DIAGNOSIS — G8929 Other chronic pain: Secondary | ICD-10-CM

## 2016-12-09 MED ORDER — PREGABALIN 75 MG PO CAPS: 70.0000 mg | ORAL_CAPSULE | Freq: Two times a day (BID) | ORAL | 2 refills | Status: DC

## 2016-12-09 MED ORDER — ALPRAZOLAM 0.25 MG PO TABS: 0.2500 mg | ORAL_TABLET | Freq: Every day | ORAL | 0 refills | Status: DC | PRN
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
## 2016-12-09 NOTE — Assessment & Plan Note (Signed)
Doing well on Lexapro. Requesting refill for Xanax. Patient uses medication very sparingly and 15 tabs last her almost a year. Refilled today and advised patient to f/u with PCP for future refills. I looked up patient on [place] Controlled Substances Reporting System and saw no activity that raised concern of inappropriate use.

## 2016-12-09 NOTE — Patient Instructions (Signed)
Refilled medications for you  Please let us know if anxiety or back pain worsens in any way- or If you would like to do physical therapy at some point.  If there is no improvement in your symptoms, or if there is any worsening of symptoms, or if you have any additional concerns, please return for re-evaluation; or, if we are closed, consider going to the Emergency Room for evaluation if symptoms urgent.

## 2016-12-09 NOTE — Assessment & Plan Note (Signed)
Stable.patient like to establish and follow with PCP for refills of Lyrica which I think is understandable. On discussion about physical therapy, patient politely declined today. Advised continued exercise and let us know back pain flares up.

## 2016-12-28 ENCOUNTER — Other Ambulatory Visit: Payer: Self-pay

## 2016-12-28 ENCOUNTER — Telehealth: Payer: Self-pay

## 2016-12-28 MED ORDER — TIZANIDINE HCL 4 MG PO TABS: 4.0000 mg | ORAL_TABLET | Freq: Every evening | ORAL | 0 refills | Status: DC | PRN

## 2016-12-28 MED ORDER — ESCITALOPRAM OXALATE 20 MG PO TABS: 20.0000 mg | ORAL_TABLET | Freq: Every day | ORAL | 1 refills | Status: DC

## 2016-12-28 NOTE — Telephone Encounter (Signed)
Needs a f/u appt.  Need to schedule then can refill only until appt.  Needs to keep this appt.

## 2016-12-28 NOTE — Telephone Encounter (Signed)
Fax received from pharmacy for refill on 90 day supply for: Escitalopram 20mg  1 po qd  Tizanidine 4 mg 1 po qhs  Patient last app 04/23/16 no f/u

## 2016-12-28 NOTE — Telephone Encounter (Signed)
Left message to return call to our office.  

## 2016-12-28 NOTE — Telephone Encounter (Signed)
Patient app made and refill sent in. Will call if any questions informed she will not get refill if she does not keep f/u app.

## 2017-03-14 ENCOUNTER — Other Ambulatory Visit: Payer: Self-pay | Admitting: Internal Medicine

## 2017-03-22 ENCOUNTER — Other Ambulatory Visit: Payer: Self-pay | Admitting: Family

## 2017-03-22 DIAGNOSIS — G8929 Other chronic pain: Secondary | ICD-10-CM

## 2017-03-22 DIAGNOSIS — M5441 Lumbago with sciatica, right side: Principal | ICD-10-CM

## 2017-03-24 ENCOUNTER — Encounter: Payer: Self-pay | Admitting: Internal Medicine

## 2017-03-24 NOTE — Telephone Encounter (Signed)
Faxed Lyrica to pharm/thx dmf

## 2017-04-07 ENCOUNTER — Encounter: Payer: Self-pay | Admitting: Internal Medicine

## 2017-04-07 ENCOUNTER — Ambulatory Visit: Payer: BC Managed Care – PPO | Admitting: Internal Medicine

## 2017-04-07 VITALS — BP 122/76 | HR 85 | Temp 98.3°F | Ht 62.0 in | Wt 197.6 lb

## 2017-04-07 DIAGNOSIS — G8929 Other chronic pain: Secondary | ICD-10-CM | POA: Diagnosis not present

## 2017-04-07 DIAGNOSIS — R51 Headache: Secondary | ICD-10-CM

## 2017-04-07 DIAGNOSIS — F411 Generalized anxiety disorder: Secondary | ICD-10-CM

## 2017-04-07 DIAGNOSIS — Z1239 Encounter for other screening for malignant neoplasm of breast: Secondary | ICD-10-CM

## 2017-04-07 DIAGNOSIS — Z0000 Encounter for general adult medical examination without abnormal findings: Secondary | ICD-10-CM | POA: Diagnosis not present

## 2017-04-07 DIAGNOSIS — R519 Headache, unspecified: Secondary | ICD-10-CM

## 2017-04-07 DIAGNOSIS — Z1231 Encounter for screening mammogram for malignant neoplasm of breast: Secondary | ICD-10-CM

## 2017-04-07 DIAGNOSIS — E7800 Pure hypercholesterolemia, unspecified: Secondary | ICD-10-CM

## 2017-04-07 DIAGNOSIS — M5441 Lumbago with sciatica, right side: Secondary | ICD-10-CM | POA: Diagnosis not present

## 2017-04-07 NOTE — Progress Notes (Signed)
Pre visit review using our clinic review tool, if applicable. No additional management support is needed unless otherwise documented below in the visit note. 

## 2017-04-07 NOTE — Progress Notes (Signed)
Patient ID: Ariel Morgan, female   DOB: 06/05/1975, 42 y.o.   MRN: [id number]   Subjective:    Patient ID: Ariel Morgan, female    DOB: [birth year], 42 y.o.   MRN: [id number]  HPI  Patient here for her physical exam.  Still having back/leg pain.  Persistent.  Has f/u with Dr Rolena Infante - for injection.  Scheduled in two weeks.  Limits her activity.  Concerned regarding her weight.  Discussed diet adjustment.  Discussed exercise.  Plans to discuss with Dr Rolena Infante regarding appropriate exercises.  No chest pain.  No sob.  No acid reflux.  No abdominal pain.  Bowels moving.  Increased stress.  Overall she feels she is handling things relatively well.  Does report problems with remembering things.  After discussion, it appears that she has trouble focusing and staying on task.  Jumps around doing things.  Two sons have ADHD.  She would like evaluation and formal testing.     Past Medical History:  Diagnosis Date  . Anemia   . Frequent headaches    H/O  . GERD (gastroesophageal reflux disease)   . Hypercholesterolemia   . Placenta previa    Past Surgical History:  Procedure Laterality Date  . CESAREAN SECTION  2003 & 2010  . CHOLECYSTECTOMY  2005   Family History  Problem Relation Age of Onset  . Hyperlipidemia Father   . Diabetes Sister   . Diabetes Maternal Grandmother   . Cancer Maternal Grandmother        vaginal  . Cirrhosis Maternal Grandmother   . Colon polyps Maternal Grandmother    Social History   Socioeconomic History  . Marital status: Married    Spouse name: None  . Number of children: 3  . Years of education: None  . Highest education level: None  Social Needs  . Financial resource strain: None  . Food insecurity - worry: None  . Food insecurity - inability: None  . Transportation needs - medical: None  . Transportation needs - non-medical: None  Occupational History  . None  Tobacco Use  . Smoking status: Former Smoker    Packs/day: 1.00    Types:  Cigarettes  . Smokeless tobacco: Never Used  Substance and Sexual Activity  . Alcohol use: Yes    Alcohol/week: 0.0 oz    Comment: rarely  . Drug use: No  . Sexual activity: None  Other Topics Concern  . None  Social History Narrative  . None    Outpatient Encounter Medications as of 04/07/2017  Medication Sig  . ALPRAZolam (XANAX) 0.25 MG tablet Take 1 tablet (0.25 mg total) by mouth daily as needed. for anxiety  . escitalopram (LEXAPRO) 20 MG tablet Take 1 tablet (20 mg total) by mouth daily.  Marland Kitchen etodolac (LODINE) 400 MG tablet Take 1 tablet (400 mg total) by mouth 2 (two) times daily as needed.  Marland Kitchen LYRICA 75 MG capsule TAKE ONE CAPSULE BY MOUTH TWICE A DAY AS NEEDED  . penciclovir (DENAVIR) 1 % cream Apply 1 application topically 4 (four) times daily.  . SUMAtriptan Succinate (IMITREX PO) Take by mouth as needed.  Marland Kitchen tiZANidine (ZANAFLEX) 4 MG tablet TAKE 1 TABLET (4 MG TOTAL) BY MOUTH AT BEDTIME AS NEEDED FOR MUSCLE SPASMS.   No facility-administered encounter medications on file as of 04/07/2017.     Review of Systems  Constitutional: Negative for appetite change and unexpected weight change.  HENT: Negative for congestion and sinus pressure.   Eyes:  Negative for pain and visual disturbance.  Respiratory: Negative for cough, chest tightness and shortness of breath.   Cardiovascular: Negative for chest pain, palpitations and leg swelling.  Gastrointestinal: Negative for abdominal pain, diarrhea, nausea and vomiting.  Genitourinary: Negative for difficulty urinating and dysuria.  Musculoskeletal: Positive for back pain. Negative for joint swelling.  Skin: Negative for color change and rash.  Neurological: Negative for dizziness, light-headedness and headaches.  Hematological: Negative for adenopathy. Does not bruise/bleed easily.  Psychiatric/Behavioral: Negative for agitation and dysphoric mood.       Increased stress as outlined.         Objective:    Physical Exam    Constitutional: She is oriented to person, place, and time. She appears well-developed and well-nourished. No distress.  HENT:  Nose: Nose normal.  Mouth/Throat: Oropharynx is clear and moist.  Eyes: Right eye exhibits no discharge. Left eye exhibits no discharge. No scleral icterus.  Neck: Neck supple. No thyromegaly present.  Cardiovascular: Normal rate and regular rhythm.  Pulmonary/Chest: Breath sounds normal. No accessory muscle usage. No tachypnea. No respiratory distress. She has no decreased breath sounds. She has no wheezes. She has no rhonchi. Right breast exhibits no inverted nipple, no mass, no nipple discharge and no tenderness (no axillary adenopathy). Left breast exhibits no inverted nipple, no mass, no nipple discharge and no tenderness (no axilarry adenopathy).  Abdominal: Soft. Bowel sounds are normal. There is no tenderness.  Musculoskeletal: She exhibits no edema or tenderness.  Lymphadenopathy:    She has no cervical adenopathy.  Neurological: She is alert and oriented to person, place, and time.  Skin: Skin is warm. No rash noted. No erythema.  Psychiatric: She has a normal mood and affect. Her behavior is normal.    BP 122/76   Pulse 85   Temp 98.3 F (36.8 C) (Oral)   Ht 5\' 2"  (1.575 m)   Wt 197 lb 9.6 oz (89.6 kg)   SpO2 95%   BMI 36.14 kg/m  Wt Readings from Last 3 Encounters:  04/07/17 197 lb 9.6 oz (89.6 kg)  12/09/16 192 lb (87.1 kg)  04/23/16 187 lb 6.4 oz (85 kg)     Lab Results  Component Value Date   WBC 7.5 01/28/2014   HGB 13.3 01/28/2014   HCT 39.5 01/28/2014   PLT 299.0 01/28/2014   GLUCOSE 90 01/28/2014   CHOL 189 01/28/2014   TRIG 75.0 01/28/2014   HDL 58.60 01/28/2014   LDLDIRECT 132.6 01/29/2013   LDLCALC 115 (H) 01/28/2014   ALT 21 01/28/2014   AST 17 01/28/2014   NA 139 01/28/2014   K 3.8 01/28/2014   CL 108 01/28/2014   CREATININE 0.7 01/28/2014   BUN 10 01/28/2014   CO2 19 01/28/2014   TSH 0.57 01/28/2014        Assessment & Plan:   Problem List Items Addressed This Visit    GAD (generalized anxiety disorder)    On lexapro.  Overall stable.  She is concerned regarding memory.  After discussion, appears to have issues with focusing and staying on task.  Have her continue lexapro.  Discussed with counselor regarding further testing and evaluation.  Two sons with ADHD.        Headache    Stable.        Health care maintenance    Physical today 04/07/17.  PAP 03/2016 - negative with negative HPV.  Schedule mammogram.       Hypercholesterolemia    Low cholesterol diet  and exercise.  Follow lipid panel.        Relevant Orders   CBC with Differential/Platelet   Comprehensive metabolic panel   TSH   Lipid panel   Low back pain    Persistent pain.  S/p previous injection.  Has f/u planned with Dr Rolena Infante in 2 weeks.         Other Visit Diagnoses    Routine general medical examination at a health care facility    -  Primary   Breast cancer screening       Relevant Orders   MM DIGITAL SCREENING BILATERAL       [name], MD

## 2017-04-07 NOTE — Assessment & Plan Note (Addendum)
Physical today 04/07/17.  PAP 03/2016 - negative with negative HPV.  Schedule mammogram.

## 2017-04-10 ENCOUNTER — Encounter: Payer: Self-pay | Admitting: Internal Medicine

## 2017-04-10 NOTE — Assessment & Plan Note (Signed)
Stable

## 2017-04-10 NOTE — Assessment & Plan Note (Signed)
On lexapro.  Overall stable.  She is concerned regarding memory.  After discussion, appears to have issues with focusing and staying on task.  Have her continue lexapro.  Discussed with counselor regarding further testing and evaluation.  Two sons with ADHD.

## 2017-04-10 NOTE — Assessment & Plan Note (Signed)
Persistent pain.  S/p previous injection.  Has f/u planned with Dr Rolena Infante in 2 weeks.

## 2017-04-10 NOTE — Assessment & Plan Note (Signed)
Low cholesterol diet and exercise.  Follow lipid panel.   

## 2017-04-24 ENCOUNTER — Other Ambulatory Visit: Payer: Self-pay | Admitting: Internal Medicine

## 2017-04-24 DIAGNOSIS — G8929 Other chronic pain: Secondary | ICD-10-CM

## 2017-04-24 DIAGNOSIS — M5441 Lumbago with sciatica, right side: Principal | ICD-10-CM

## 2017-04-25 ENCOUNTER — Other Ambulatory Visit: Payer: BC Managed Care – PPO

## 2017-04-25 NOTE — Telephone Encounter (Signed)
Okay to refill? Last written on: 03/23/17 for #60 with 1 refill.  LOV: 04/07/17 NOV: 07/18/17

## 2017-04-26 ENCOUNTER — Ambulatory Visit
Admission: RE | Admit: 2017-04-26 | Discharge: 2017-04-26 | Disposition: A | Discharge status: Home / Self Care | Payer: BC Managed Care – PPO | Source: Ambulatory Visit | Attending: Internal Medicine | Admitting: Internal Medicine

## 2017-04-26 ENCOUNTER — Other Ambulatory Visit (INDEPENDENT_AMBULATORY_CARE_PROVIDER_SITE_OTHER): Payer: BC Managed Care – PPO

## 2017-04-26 DIAGNOSIS — Z1231 Encounter for screening mammogram for malignant neoplasm of breast: Secondary | ICD-10-CM | POA: Insufficient documentation

## 2017-04-26 DIAGNOSIS — E7800 Pure hypercholesterolemia, unspecified: Secondary | ICD-10-CM

## 2017-04-26 DIAGNOSIS — R928 Other abnormal and inconclusive findings on diagnostic imaging of breast: Secondary | ICD-10-CM | POA: Insufficient documentation

## 2017-04-26 DIAGNOSIS — N6489 Other specified disorders of breast: Secondary | ICD-10-CM | POA: Diagnosis not present

## 2017-04-26 DIAGNOSIS — Z1239 Encounter for other screening for malignant neoplasm of breast: Secondary | ICD-10-CM

## 2017-04-26 LAB — CBC WITH DIFFERENTIAL/PLATELET
BASOS ABS: 0.1 10*3/uL (ref 0.0–0.1)
Basophils Relative: 1.2 % (ref 0.0–3.0)
EOS ABS: 0.2 10*3/uL (ref 0.0–0.7)
EOS PCT: 1.9 % (ref 0.0–5.0)
HCT: 42 % (ref 36.0–46.0)
Hemoglobin: 14.4 g/dL (ref 12.0–15.0)
LYMPHS ABS: 2.8 10*3/uL (ref 0.7–4.0)
Lymphocytes Relative: 28.6 % (ref 12.0–46.0)
MCHC: 34.2 g/dL (ref 30.0–36.0)
MCV: 89.7 fl (ref 78.0–100.0)
MONO ABS: 0.6 10*3/uL (ref 0.1–1.0)
Monocytes Relative: 5.7 % (ref 3.0–12.0)
NEUTROS PCT: 62.6 % (ref 43.0–77.0)
Neutro Abs: 6.2 10*3/uL (ref 1.4–7.7)
Platelets: 377 10*3/uL (ref 150.0–400.0)
RBC: 4.68 Mil/uL (ref 3.87–5.11)
RDW: 12.9 % (ref 11.5–15.5)
WBC: 9.9 10*3/uL (ref 4.0–10.5)

## 2017-04-26 LAB — LIPID PANEL
Cholesterol: 201 mg/dL — ABNORMAL HIGH (ref 0–200)
HDL: 60.6 mg/dL (ref >39.00)
LDL Cholesterol: 109 mg/dL — ABNORMAL HIGH (ref 0–99)
NONHDL: 140.3
Total CHOL/HDL Ratio: 3
Triglycerides: 158 mg/dL — ABNORMAL HIGH (ref 0.0–149.0)
VLDL: 31.6 mg/dL (ref 0.0–40.0)

## 2017-04-26 LAB — COMPREHENSIVE METABOLIC PANEL
ALT: 27 U/L (ref 0–35)
AST: 18 U/L (ref 0–37)
Albumin: 4.3 g/dL (ref 3.5–5.2)
Alkaline Phosphatase: 75 U/L (ref 39–117)
BILIRUBIN TOTAL: 0.6 mg/dL (ref 0.2–1.2)
BUN: 12 mg/dL (ref 6–23)
CALCIUM: 9.3 mg/dL (ref 8.4–10.5)
CO2: 30 meq/L (ref 19–32)
Chloride: 104 mEq/L (ref 96–112)
Creatinine, Ser: 0.73 mg/dL (ref 0.40–1.20)
GFR: 93.25 mL/min (ref >60.00)
GLUCOSE: 89 mg/dL (ref 70–99)
POTASSIUM: 4.3 meq/L (ref 3.5–5.1)
Sodium: 139 mEq/L (ref 135–145)
Total Protein: 7.4 g/dL (ref 6.0–8.3)

## 2017-04-26 LAB — TSH: TSH: 1.86 u[IU]/mL (ref 0.35–4.50)

## 2017-04-26 NOTE — Telephone Encounter (Signed)
Rx faxed to [facility] 

## 2017-04-27 ENCOUNTER — Other Ambulatory Visit: Payer: Self-pay | Admitting: Internal Medicine

## 2017-04-27 DIAGNOSIS — N6489 Other specified disorders of breast: Secondary | ICD-10-CM

## 2017-04-27 DIAGNOSIS — R928 Other abnormal and inconclusive findings on diagnostic imaging of breast: Secondary | ICD-10-CM

## 2017-05-02 ENCOUNTER — Ambulatory Visit
Admission: RE | Admit: 2017-05-02 | Discharge: 2017-05-02 | Disposition: A | Discharge status: Home / Self Care | Payer: BC Managed Care – PPO | Source: Ambulatory Visit | Attending: Internal Medicine | Admitting: Internal Medicine

## 2017-05-02 DIAGNOSIS — R921 Mammographic calcification found on diagnostic imaging of breast: Secondary | ICD-10-CM | POA: Insufficient documentation

## 2017-05-02 DIAGNOSIS — N6489 Other specified disorders of breast: Secondary | ICD-10-CM

## 2017-05-02 DIAGNOSIS — R928 Other abnormal and inconclusive findings on diagnostic imaging of breast: Secondary | ICD-10-CM

## 2017-05-03 ENCOUNTER — Encounter: Payer: Self-pay | Admitting: Internal Medicine

## 2017-05-03 DIAGNOSIS — R928 Other abnormal and inconclusive findings on diagnostic imaging of breast: Secondary | ICD-10-CM

## 2017-05-04 ENCOUNTER — Ambulatory Visit: Payer: BC Managed Care – PPO | Admitting: General Surgery

## 2017-05-04 ENCOUNTER — Encounter: Payer: Self-pay | Admitting: General Surgery

## 2017-05-04 VITALS — BP 136/74 | HR 98 | Resp 14 | Ht 62.0 in | Wt 195.0 lb

## 2017-05-04 DIAGNOSIS — R920 Mammographic microcalcification found on diagnostic imaging of breast: Secondary | ICD-10-CM | POA: Insufficient documentation

## 2017-05-04 NOTE — Telephone Encounter (Signed)
Order placed for referral to Dr Bary Castilla.  See result note.

## 2017-05-04 NOTE — Progress Notes (Signed)
Patient ID: Ariel Morgan, female   DOB: 1976-01-02, 41 y.o.   MRN: [id number]  Chief Complaint  Patient presents with  . Other    HPI Ariel Morgan is a 42 y.o. female who presents for a breast evaluation. The most recent mammogram was done on 04/27/2017 and added views and right breast ultrasound done on 05/02/2017  Patient does perform regular self breast checks and don't gets regular mammograms done.    HPI  Past Medical History:  Diagnosis Date  . Anemia   . Frequent headaches    H/O  . GERD (gastroesophageal reflux disease)   . Hypercholesterolemia   . Placenta previa     Past Surgical History:  Procedure Laterality Date  . CESAREAN SECTION  2003 & 2010  . CHOLECYSTECTOMY  2005    Family History  Problem Relation Age of Onset  . Hyperlipidemia Father   . Diabetes Sister   . Diabetes Maternal Grandmother   . Cancer Maternal Grandmother        vaginal  . Cirrhosis Maternal Grandmother   . Colon polyps Maternal Grandmother   . Breast cancer Maternal Grandmother        Early 24's    Social History Social History   Tobacco Use  . Smoking status: Former Smoker    Packs/day: 1.00    Types: Cigarettes  . Smokeless tobacco: Never Used  Substance Use Topics  . Alcohol use: Yes    Alcohol/week: 0.0 oz    Comment: rarely  . Drug use: No    No Known Allergies  Current Outpatient Medications  Medication Sig Dispense Refill  . ALPRAZolam (XANAX) 0.25 MG tablet Take 1 tablet (0.25 mg total) by mouth daily as needed. for anxiety 15 tablet 0  . escitalopram (LEXAPRO) 20 MG tablet Take 1 tablet (20 mg total) by mouth daily. 90 tablet 1  . etodolac (LODINE) 400 MG tablet Take 1 tablet (400 mg total) by mouth 2 (two) times daily as needed. 40 tablet 0  . LYRICA 75 MG capsule TAKE 1 CAPSULE BY MOUTH TWICE A DAY AS NEEDED 60 capsule 1  . penciclovir (DENAVIR) 1 % cream Apply 1 application topically 4 (four) times daily. 1.5 g 0  . SUMAtriptan Succinate (IMITREX  PO) Take by mouth as needed.    Marland Kitchen tiZANidine (ZANAFLEX) 4 MG tablet TAKE 1 TABLET (4 MG TOTAL) BY MOUTH AT BEDTIME AS NEEDED FOR MUSCLE SPASMS. 90 tablet 0   No current facility-administered medications for this visit.     Review of Systems Review of Systems  Constitutional: Negative.   Respiratory: Negative.   Cardiovascular: Negative.     Blood pressure 136/74, pulse 98, resp. rate 14, height 5\' 2"  (1.575 m), weight 195 lb (88.5 kg).  Physical Exam Physical Exam  Constitutional: She is oriented to person, place, and time. She appears well-developed and well-nourished.  Eyes: Conjunctivae are normal. No scleral icterus.  Neck: Neck supple.  Cardiovascular: Normal rate, regular rhythm and normal heart sounds.  Pulmonary/Chest: Effort normal and breath sounds normal. Right breast exhibits no inverted nipple, no mass, no nipple discharge, no skin change and no tenderness. Left breast exhibits no inverted nipple, no mass, no nipple discharge, no skin change and no tenderness. Breasts are asymmetrical (right breast bigger than left breast).  Lymphadenopathy:    She has no cervical adenopathy.  Neurological: She is alert and oriented to person, place, and time.  Skin: Skin is warm and dry.    Data Reviewed  March 30, 2006 screening mammograms showed extremely dense breast consistent with age.  Microcalcifications would easily be missed on this study completed for a palpable mass in the left breast.  Subsequent ultrasound showed dense but otherwise normal breast parenchyma without mass-effect.Marland Kitchen  BI-RADS-1.  Bilateral digital mammograms dated July 08, 2011, within a year of finishing nursing showed even more pronounced breast parenchyma.  BI-RADS-2.  Multiple imaging studies from April 27, 2016 including screening mammograms, right diagnostic mammograms and right breast ultrasound were reviewed.  Diffuse area of calcifications involving the medial aspect of the baseball right breast  without any dominant mass.  Many of the areas of calcifications are indeterminant, and while reported "new" from 2013, in no way shape or form with the be visible on the 2013 images as stored in the PACS system.  BI-RADS-4.   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    Plan  Options for management were reviewed: 1) observation versus 2) early biopsy.  If the patient desired to avoid biopsy a six-month follow-up would be appropriate and if interval change was noted biopsy at that time would not preclude safe management of DCIS.  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  She asked to have the procedure scheduled at our facility could could completed in the most timely fashion regardless of provider.  We will arrange for radiologist directed biopsy of the area of most concern rather than multiple areas anticipating a benign result.    Patient to have a right breast stereo biopsy done. The patient is aware to call back for any questions or concerns.  HPI, Physical Exam, Assessment and Plan have been scribed under the direction and in the presence of Hervey Ard, MD.  Gaspar Cola, CMA  I have completed the exam and reviewed the above documentation for accuracy and completeness.  I agree with the above.  Haematologist has been used and any errors in dictation or transcription are unintentional.  Hervey Ard, M.D., F.A.C.S.   Forest Gleason Byrnett 05/04/2017, 4:48 PM  Patient to be scheduled for a right breast stereotactic biopsy at [facility] to be completed by the radiologist. The patient is aware she will be contacted by the mammography staff at the [facility] in regards to a date and time.   Dominga Ferry, CMA

## 2017-05-04 NOTE — Patient Instructions (Signed)
Patient to have a right breast stereo biopsy done. The patient is aware to call back for any questions or concerns.

## 2017-05-05 ENCOUNTER — Other Ambulatory Visit: Payer: Self-pay | Admitting: General Surgery

## 2017-05-05 DIAGNOSIS — R920 Mammographic microcalcification found on diagnostic imaging of breast: Secondary | ICD-10-CM

## 2017-05-06 ENCOUNTER — Other Ambulatory Visit: Payer: Self-pay | Admitting: Internal Medicine

## 2017-05-10 ENCOUNTER — Ambulatory Visit
Admission: RE | Admit: 2017-05-10 | Discharge: 2017-05-10 | Disposition: A | Discharge status: Home / Self Care | Payer: BC Managed Care – PPO | Source: Ambulatory Visit | Attending: General Surgery | Admitting: General Surgery

## 2017-05-10 DIAGNOSIS — N62 Hypertrophy of breast: Secondary | ICD-10-CM | POA: Diagnosis not present

## 2017-05-10 DIAGNOSIS — R920 Mammographic microcalcification found on diagnostic imaging of breast: Secondary | ICD-10-CM | POA: Diagnosis not present

## 2017-05-10 HISTORY — PX: BREAST BIOPSY: SHX20

## 2017-05-12 ENCOUNTER — Telehealth: Payer: Self-pay | Admitting: General Surgery

## 2017-05-12 LAB — SURGICAL PATHOLOGY

## 2017-05-12 NOTE — Telephone Encounter (Signed)
Notified biopsy results completed May 10, 2017 were benign.  Encouraged to call back to confirm she received the message.

## 2017-05-16 ENCOUNTER — Encounter: Payer: Self-pay | Admitting: Internal Medicine

## 2017-05-17 ENCOUNTER — Encounter: Payer: Self-pay | Admitting: Internal Medicine

## 2017-05-17 NOTE — Telephone Encounter (Signed)
Letter printed and signed and placed in box.  Please see attached message for fax number.  Thanks

## 2017-05-17 NOTE — Telephone Encounter (Signed)
I have typed a letter and sent her a message to see if she wants to come by and pick up the letter or have [place] fax.  Waiting for response.

## 2017-05-17 NOTE — Telephone Encounter (Signed)
Letter faxed.

## 2017-05-23 ENCOUNTER — Encounter: Payer: Self-pay | Admitting: Internal Medicine

## 2017-06-18 ENCOUNTER — Other Ambulatory Visit: Payer: Self-pay | Admitting: Internal Medicine

## 2017-07-03 ENCOUNTER — Other Ambulatory Visit: Payer: Self-pay | Admitting: Internal Medicine

## 2017-07-05 NOTE — Telephone Encounter (Signed)
LOV 04/2017 NOV 07/18/2017 Last refill 03/25/2017 # 90 with no refills

## 2017-07-18 ENCOUNTER — Ambulatory Visit: Payer: BC Managed Care – PPO | Admitting: Internal Medicine

## 2017-07-18 VITALS — BP 108/84 | HR 83 | Temp 98.3°F | Resp 16 | Wt 194.8 lb

## 2017-07-18 DIAGNOSIS — R928 Other abnormal and inconclusive findings on diagnostic imaging of breast: Secondary | ICD-10-CM | POA: Diagnosis not present

## 2017-07-18 DIAGNOSIS — M5441 Lumbago with sciatica, right side: Secondary | ICD-10-CM | POA: Diagnosis not present

## 2017-07-18 DIAGNOSIS — F411 Generalized anxiety disorder: Secondary | ICD-10-CM

## 2017-07-18 DIAGNOSIS — R253 Fasciculation: Secondary | ICD-10-CM | POA: Diagnosis not present

## 2017-07-18 DIAGNOSIS — G8929 Other chronic pain: Secondary | ICD-10-CM

## 2017-07-18 DIAGNOSIS — E7800 Pure hypercholesterolemia, unspecified: Secondary | ICD-10-CM | POA: Diagnosis not present

## 2017-07-18 MED ORDER — ALPRAZOLAM 0.25 MG PO TABS: 0.2500 mg | ORAL_TABLET | Freq: Every day | ORAL | 0 refills | Status: DC | PRN

## 2017-07-18 MED ORDER — PREGABALIN 75 MG PO CAPS: ORAL_CAPSULE | ORAL | 1 refills | Status: DC

## 2017-07-18 NOTE — Progress Notes (Signed)
Patient ID: Ariel Morgan, female   DOB: Sep 19, 1975, 42 y.o.   MRN: [id number]   Subjective:    Patient ID: Ariel Morgan, female    DOB: 07/18/1975, 42 y.o.   MRN: [id number]  HPI  Patient here for a scheduled follow up.  She reports she is doing relatively well.  Has noticed her eyelid twitching.  Started a couple of weeks ago.  Increased stress.  Discussed with her today.  Increased stress with school and some stress with her kids.  She is sleeping.  No increased eye strain.  Discussed further w/up.  She wants to monitor.  Also has a plantar wart.  Discussed supports.  She will follow and notify me if persistent.  Discussed diet and exercise.  No nausea or vomiting.  Bowels moving.  Have discussed her concern regarding ADHD.  Information given to her to call to set up appt.     Past Medical History:  Diagnosis Date  . Anemia   . Frequent headaches    H/O  . GERD (gastroesophageal reflux disease)   . Hypercholesterolemia   . Placenta previa    Past Surgical History:  Procedure Laterality Date  . BREAST BIOPSY Right 05/10/2017   right breast stereotatic bx path pending  . CESAREAN SECTION  2003 & 2010  . CHOLECYSTECTOMY  2005   Family History  Problem Relation Age of Onset  . Hyperlipidemia Father   . Diabetes Sister   . Diabetes Maternal Grandmother   . Cancer Maternal Grandmother        vaginal  . Cirrhosis Maternal Grandmother   . Colon polyps Maternal Grandmother   . Breast cancer Maternal Grandmother        Early 81's   Social History   Socioeconomic History  . Marital status: Married    Spouse name: Not on file  . Number of children: 3  . Years of education: Not on file  . Highest education level: Not on file  Occupational History  . Not on file  Social Needs  . Financial resource strain: Not on file  . Food insecurity:    Worry: Not on file    Inability: Not on file  . Transportation needs:    Medical: Not on file    Non-medical: Not on file    Tobacco Use  . Smoking status: Former Smoker    Packs/day: 1.00    Types: Cigarettes  . Smokeless tobacco: Never Used  Substance and Sexual Activity  . Alcohol use: Yes    Alcohol/week: 0.0 oz    Comment: rarely  . Drug use: No  . Sexual activity: Not on file  Lifestyle  . Physical activity:    Days per week: Not on file    Minutes per session: Not on file  . Stress: Not on file  Relationships  . Social connections:    Talks on phone: Not on file    Gets together: Not on file    Attends religious service: Not on file    Active member of club or organization: Not on file    Attends meetings of clubs or organizations: Not on file    Relationship status: Not on file  Other Topics Concern  . Not on file  Social History Narrative  . Not on file    Outpatient Encounter Medications as of 07/18/2017  Medication Sig  . ALPRAZolam (XANAX) 0.25 MG tablet Take 1 tablet (0.25 mg total) by mouth daily as needed. for anxiety  .  escitalopram (LEXAPRO) 20 MG tablet TAKE 1 TABLET BY MOUTH EVERY DAY  . pregabalin (LYRICA) 75 MG capsule TAKE 1 CAPSULE BY MOUTH TWICE A DAY AS NEEDED  . tiZANidine (ZANAFLEX) 4 MG tablet TAKE 1 TABLET (4 MG TOTAL) BY MOUTH AT BEDTIME AS NEEDED FOR MUSCLE SPASMS.  . [DISCONTINUED] ALPRAZolam (XANAX) 0.25 MG tablet Take 1 tablet (0.25 mg total) by mouth daily as needed. for anxiety  . [DISCONTINUED] etodolac (LODINE) 400 MG tablet Take 1 tablet (400 mg total) by mouth 2 (two) times daily as needed.  . [DISCONTINUED] LYRICA 75 MG capsule TAKE 1 CAPSULE BY MOUTH TWICE A DAY AS NEEDED  . [DISCONTINUED] penciclovir (DENAVIR) 1 % cream Apply 1 application topically 4 (four) times daily.  . [DISCONTINUED] SUMAtriptan Succinate (IMITREX PO) Take by mouth as needed.   No facility-administered encounter medications on file as of 07/18/2017.     Review of Systems  Constitutional: Negative for appetite change and unexpected weight change.  HENT: Negative for congestion  and sinus pressure.   Respiratory: Negative for cough, chest tightness and shortness of breath.   Cardiovascular: Negative for chest pain, palpitations and leg swelling.  Gastrointestinal: Negative for abdominal pain, diarrhea, nausea and vomiting.  Genitourinary: Negative for difficulty urinating and dysuria.  Musculoskeletal: Negative for joint swelling and myalgias.  Skin: Negative for color change and rash.  Neurological: Negative for dizziness, light-headedness and headaches.  Psychiatric/Behavioral: Negative for agitation and dysphoric mood.       Objective:     Blood pressure rechecked by me:  110/68-70  Physical Exam  Constitutional: She appears well-developed and well-nourished. No distress.  HENT:  Nose: Nose normal.  Mouth/Throat: Oropharynx is clear and moist.  Neck: Neck supple. No thyromegaly present.  Cardiovascular: Normal rate and regular rhythm.  Pulmonary/Chest: Breath sounds normal. No respiratory distress. She has no wheezes.  Abdominal: Soft. Bowel sounds are normal. There is no tenderness.  Musculoskeletal: She exhibits no edema or tenderness.  Lymphadenopathy:    She has no cervical adenopathy.  Skin: No rash noted. No erythema.  Psychiatric: She has a normal mood and affect. Her behavior is normal.    BP 108/84 (BP Location: Left Arm, Patient Position: Sitting, Cuff Size: Normal)   Pulse 83   Temp 98.3 F (36.8 C) (Oral)   Resp 16   Wt 194 lb 12.8 oz (88.4 kg)   SpO2 98%   BMI 35.63 kg/m  Wt Readings from Last 3 Encounters:  07/18/17 194 lb 12.8 oz (88.4 kg)  05/04/17 195 lb (88.5 kg)  04/07/17 197 lb 9.6 oz (89.6 kg)     Lab Results  Component Value Date   WBC 9.9 04/26/2017   HGB 14.4 04/26/2017   HCT 42.0 04/26/2017   PLT 377.0 04/26/2017   GLUCOSE 89 04/26/2017   CHOL 201 (H) 04/26/2017   TRIG 158.0 (H) 04/26/2017   HDL 60.60 04/26/2017   LDLDIRECT 132.6 01/29/2013   LDLCALC 109 (H) 04/26/2017   ALT 27 04/26/2017   AST 18  04/26/2017   NA 139 04/26/2017   K 4.3 04/26/2017   CL 104 04/26/2017   CREATININE 0.73 04/26/2017   BUN 12 04/26/2017   CO2 30 04/26/2017   TSH 1.86 04/26/2017    Mm Clip Placement Right  Result Date: 05/10/2017 CLINICAL DATA:  Post stereotactic guided biopsy of calcifications in the lower inner right breast. EXAM: DIAGNOSTIC RIGHT MAMMOGRAM POST STEREOTACTIC BIOPSY COMPARISON:  Previous exam(s). FINDINGS: Mammographic images were obtained following stereotactic guided biopsy  of calcifications in the lower inner right breast. And X shaped biopsy marking clip is present at the site of the biopsied calcifications in the lower slightly inner right breast. IMPRESSION: X shaped biopsy marking clip appropriately positioned at site of biopsied calcifications in the lower inner right breast. Final Assessment: Post Procedure Mammograms for Marker Placement Electronically Signed   By: Everlean Alstrom M.D.   On: 05/10/2017 09:09   Mm Rt Breast Bx W Loc Dev 1st Lesion Image Bx Spec Stereo Guide  Addendum Date: 05/17/2017   ADDENDUM REPORT: 05/17/2017 10:42 ADDENDUM: Pathology of the right breast biopsy revealed A. RIGHT LOWER INNER BREAST; STEREOTACTIC BIOPSY: FIBROCYSTIC CHANGE WITH ASSOCIATED MICROCALCIFICATIONS. PSEUDO-ANGIOMATOUS STROMAL HYPERPLASIA. NEGATIVE FOR ATYPIA AND MALIGNANCY. DEEPER LEVELS WERE EXAMINED. Note: Results were called to Dr. Bary Castilla at 9:30 AM on 05/12/2017. This was found to be concordant by Dr. Shelly Bombard. Recommendation: Six month follow-up diagnostic left breast mammogram. The patient was contacted by phone by Jetta Lout, [place] on 05/13/17 for a post biopsy site check. The patient stated she did well following the biopsy. All of her questions were answered. She stated that results had been given to her by Dr. Bary Castilla. She was encouraged to contact the [facility] with any further questions or concerns. Addendum by Jetta Lout, RRA on 05/17/17. Electronically Signed   By:  Everlean Alstrom M.D.   On: 05/17/2017 10:42   Result Date: 05/17/2017 CLINICAL DATA:  42 year old female with indeterminate calcifications in the lower inner right breast. EXAM: RIGHT BREAST STEREOTACTIC CORE NEEDLE BIOPSY COMPARISON:  Previous exams. FINDINGS: The patient and I discussed the procedure of stereotactic-guided biopsy including benefits and alternatives. We discussed the high likelihood of a successful procedure. We discussed the risks of the procedure including infection, bleeding, tissue injury, clip migration, and inadequate sampling. Informed written consent was given. The usual time out protocol was performed immediately prior to the procedure. Using sterile technique and 1% Lidocaine as local anesthetic, under stereotactic guidance, a 9 gauge vacuum assisted device was used to perform core needle biopsy of the calcifications in the lower inner right breast using a medial to lateral approach. Specimen radiograph was performed showing the presence of calcifications. Specimens with calcifications are identified for pathology. Lesion quadrant: Lower inner At the conclusion of the procedure, an X shaped tissue marker clip was deployed into the biopsy cavity. Follow-up 2-view mammogram was performed and dictated separately. IMPRESSION: Stereotactic-guided biopsy of calcifications in the lower inner right breast. No apparent complications. Electronically Signed: By: Everlean Alstrom M.D. On: 05/10/2017 09:10       Assessment & Plan:   Problem List Items Addressed This Visit    Abnormal mammogram    Just had abnormal mammogram - right breast. S/p biopsy.  Recommended f/u right breast mammo and ultrasound.        Relevant Orders   MM Digital Diagnostic Unilat R   US BREAST LTD UNI RIGHT INC AXILLA   GAD (generalized anxiety disorder)    On lexapro.  Controlled.  Discussed with her today regarding seeing a counselor for her concern regarding ADHD.  Desires to follow up for further testing  and evaluation.  Has two sons with ADHD.        Relevant Medications   ALPRAZolam (XANAX) 0.25 MG tablet   Hypercholesterolemia    Low cholesterol diet and exercise.  Follow lipid panel.        Low back pain   Relevant Medications   pregabalin (LYRICA) 75 MG  capsule    Other Visit Diagnoses    Eyelid twitch    -  Primary   Increased stress.  discussed further w/up.  she wants to monitor.  follow.         [name], MD

## 2017-07-23 ENCOUNTER — Encounter: Payer: Self-pay | Admitting: Internal Medicine

## 2017-07-23 DIAGNOSIS — R928 Other abnormal and inconclusive findings on diagnostic imaging of breast: Secondary | ICD-10-CM | POA: Insufficient documentation

## 2017-07-23 NOTE — Assessment & Plan Note (Signed)
On lexapro.  Controlled.  Discussed with her today regarding seeing a counselor for her concern regarding ADHD.  Desires to follow up for further testing and evaluation.  Has two sons with ADHD.

## 2017-07-23 NOTE — Assessment & Plan Note (Signed)
Just had abnormal mammogram - right breast. S/p biopsy.  Recommended f/u right breast mammo and ultrasound.

## 2017-07-23 NOTE — Assessment & Plan Note (Signed)
Low cholesterol diet and exercise.  Follow lipid panel.   

## 2017-07-28 ENCOUNTER — Encounter: Payer: Self-pay | Admitting: Internal Medicine

## 2017-09-15 ENCOUNTER — Other Ambulatory Visit: Payer: Self-pay | Admitting: Internal Medicine

## 2017-09-15 DIAGNOSIS — M5441 Lumbago with sciatica, right side: Principal | ICD-10-CM

## 2017-09-15 DIAGNOSIS — G8929 Other chronic pain: Secondary | ICD-10-CM

## 2017-09-16 NOTE — Telephone Encounter (Signed)
faxed

## 2017-09-16 NOTE — Telephone Encounter (Signed)
Last OV 07/18/17 Next OV 11/01/17  Last refill 07/18/17

## 2017-10-09 ENCOUNTER — Other Ambulatory Visit: Payer: Self-pay | Admitting: Internal Medicine

## 2017-10-10 NOTE — Telephone Encounter (Signed)
Last OV for 4/19 ok to fill Zanaflex?

## 2017-10-11 ENCOUNTER — Encounter: Payer: Self-pay | Admitting: Internal Medicine

## 2017-10-12 NOTE — Telephone Encounter (Signed)
Ariel Pulse, do you mind calling her and explaining that given her persistent problems she is having with back pain, I do agree with referral to pain clinic.  We have refilled her lyrica, but given the need for increase and continued pain - I agree with the recommendation for pain clinic (especially with the new regulations on medication, etc).   If agreeable, she will need to contact the orthopedic surgeon and have them place the referral as they recommended.

## 2017-10-12 NOTE — Telephone Encounter (Signed)
Patient notified and voiced understanding, patient stated she will  Contact orthopedic surgeon office for referral to pain clinic.

## 2017-11-01 ENCOUNTER — Ambulatory Visit (INDEPENDENT_AMBULATORY_CARE_PROVIDER_SITE_OTHER): Payer: BC Managed Care – PPO | Admitting: Internal Medicine

## 2017-11-01 ENCOUNTER — Other Ambulatory Visit: Payer: Self-pay

## 2017-11-01 ENCOUNTER — Encounter: Payer: Self-pay | Admitting: Internal Medicine

## 2017-11-01 DIAGNOSIS — M5441 Lumbago with sciatica, right side: Secondary | ICD-10-CM | POA: Diagnosis not present

## 2017-11-01 DIAGNOSIS — E7800 Pure hypercholesterolemia, unspecified: Secondary | ICD-10-CM | POA: Diagnosis not present

## 2017-11-01 DIAGNOSIS — G8929 Other chronic pain: Secondary | ICD-10-CM | POA: Diagnosis not present

## 2017-11-01 DIAGNOSIS — F411 Generalized anxiety disorder: Secondary | ICD-10-CM | POA: Diagnosis not present

## 2017-11-01 NOTE — Progress Notes (Signed)
Patient ID: Ariel Morgan, female   DOB: 16-Jul-1975, 42 y.o.   MRN: [id number]   Subjective:    Patient ID: Ariel Morgan, female    DOB: 1975-05-07, 42 y.o.   MRN: [id number]  HPI  Patient here for a scheduled follow up.  Went to [employer] recently.  Noticed increased swelling in her lower extremities.  Better now.  No chest pain.  Breathing stable.  No acid reflux.  No abdominal pain.  Bowels moving.  Increased right side/right hip pain.  Pain affects her activity.  Treated with prednisone.  Did not help.  Planning for MRI.  Is s/p steroid injection.  On lyrica.     Past Medical History:  Diagnosis Date  . Anemia   . Frequent headaches    H/O  . GERD (gastroesophageal reflux disease)   . Hypercholesterolemia   . Placenta previa    Past Surgical History:  Procedure Laterality Date  . BREAST BIOPSY Right 05/10/2017   right breast stereotatic bx path pending  . CESAREAN SECTION  2003 & 2010  . CHOLECYSTECTOMY  2005   Family History  Problem Relation Age of Onset  . Hyperlipidemia Father   . Diabetes Sister   . Diabetes Maternal Grandmother   . Cancer Maternal Grandmother        vaginal  . Cirrhosis Maternal Grandmother   . Colon polyps Maternal Grandmother   . Breast cancer Maternal Grandmother        Early 42's   Social History   Socioeconomic History  . Marital status: Married    Spouse name: Not on file  . Number of children: 3  . Years of education: Not on file  . Highest education level: Not on file  Occupational History  . Not on file  Social Needs  . Financial resource strain: Not on file  . Food insecurity:    Worry: Not on file    Inability: Not on file  . Transportation needs:    Medical: Not on file    Non-medical: Not on file  Tobacco Use  . Smoking status: Former Smoker    Packs/day: 1.00    Types: Cigarettes  . Smokeless tobacco: Never Used  Substance and Sexual Activity  . Alcohol use: Yes    Alcohol/week: 0.0 oz    Comment: rarely   . Drug use: No  . Sexual activity: Not on file  Lifestyle  . Physical activity:    Days per week: Not on file    Minutes per session: Not on file  . Stress: Not on file  Relationships  . Social connections:    Talks on phone: Not on file    Gets together: Not on file    Attends religious service: Not on file    Active member of club or organization: Not on file    Attends meetings of clubs or organizations: Not on file    Relationship status: Not on file  Other Topics Concern  . Not on file  Social History Narrative  . Not on file    Outpatient Encounter Medications as of 11/01/2017  Medication Sig  . ALPRAZolam (XANAX) 0.25 MG tablet Take 1 tablet (0.25 mg total) by mouth daily as needed. for anxiety  . escitalopram (LEXAPRO) 20 MG tablet TAKE 1 TABLET BY MOUTH EVERY DAY  . pregabalin (LYRICA) 75 MG capsule TAKE ONE CAPSULE BY MOUTH TWICE A DAY AS NEEDED  . tiZANidine (ZANAFLEX) 4 MG tablet TAKE 1 TABLET (4 MG  TOTAL) BY MOUTH AT BEDTIME AS NEEDED FOR MUSCLE SPASMS.   No facility-administered encounter medications on file as of 11/01/2017.     Review of Systems  Constitutional: Negative for appetite change and unexpected weight change.  HENT: Negative for congestion and sinus pressure.   Respiratory: Negative for cough, chest tightness and shortness of breath.   Cardiovascular: Negative for chest pain and palpitations.       Leg swelling better now.    Gastrointestinal: Negative for abdominal pain, diarrhea, nausea and vomiting.  Genitourinary: Negative for difficulty urinating and dysuria.  Musculoskeletal: Positive for back pain. Negative for myalgias.       Right hip and leg pain.    Skin: Negative for color change and rash.  Neurological: Negative for dizziness, light-headedness and headaches.  Psychiatric/Behavioral: Negative for agitation and dysphoric mood.       Objective:    Physical Exam  Constitutional: She appears well-developed and well-nourished. No  distress.  HENT:  Nose: Nose normal.  Mouth/Throat: Oropharynx is clear and moist.  Neck: Neck supple. No thyromegaly present.  Cardiovascular: Normal rate and regular rhythm.  Pulmonary/Chest: Breath sounds normal. No respiratory distress. She has no wheezes.  Abdominal: Soft. Bowel sounds are normal. There is no tenderness.  Musculoskeletal: She exhibits no edema or tenderness.  Lymphadenopathy:    She has no cervical adenopathy.  Skin: No rash noted. No erythema.  Psychiatric: She has a normal mood and affect. Her behavior is normal.    BP 118/82 (BP Location: Left Arm, Patient Position: Sitting, Cuff Size: Normal)   Pulse 89   Temp 98 F (36.7 C) (Oral)   Wt 197 lb 3.2 oz (89.4 kg)   SpO2 97%   BMI 36.07 kg/m  Wt Readings from Last 3 Encounters:  11/01/17 197 lb 3.2 oz (89.4 kg)  07/18/17 194 lb 12.8 oz (88.4 kg)  05/04/17 195 lb (88.5 kg)     Lab Results  Component Value Date   WBC 9.9 04/26/2017   HGB 14.4 04/26/2017   HCT 42.0 04/26/2017   PLT 377.0 04/26/2017   GLUCOSE 89 04/26/2017   CHOL 201 (H) 04/26/2017   TRIG 158.0 (H) 04/26/2017   HDL 60.60 04/26/2017   LDLDIRECT 132.6 01/29/2013   LDLCALC 109 (H) 04/26/2017   ALT 27 04/26/2017   AST 18 04/26/2017   NA 139 04/26/2017   K 4.3 04/26/2017   CL 104 04/26/2017   CREATININE 0.73 04/26/2017   BUN 12 04/26/2017   CO2 30 04/26/2017   TSH 1.86 04/26/2017    Mm Clip Placement Right  Result Date: 05/10/2017 CLINICAL DATA:  Post stereotactic guided biopsy of calcifications in the lower inner right breast. EXAM: DIAGNOSTIC RIGHT MAMMOGRAM POST STEREOTACTIC BIOPSY COMPARISON:  Previous exam(s). FINDINGS: Mammographic images were obtained following stereotactic guided biopsy of calcifications in the lower inner right breast. And X shaped biopsy marking clip is present at the site of the biopsied calcifications in the lower slightly inner right breast. IMPRESSION: X shaped biopsy marking clip appropriately  positioned at site of biopsied calcifications in the lower inner right breast. Final Assessment: Post Procedure Mammograms for Marker Placement Electronically Signed   By: Everlean Alstrom M.D.   On: 05/10/2017 09:09   Mm Rt Breast Bx W Loc Dev 1st Lesion Image Bx Spec Stereo Guide  Addendum Date: 05/17/2017   ADDENDUM REPORT: 05/17/2017 10:42 ADDENDUM: Pathology of the right breast biopsy revealed A. RIGHT LOWER INNER BREAST; STEREOTACTIC BIOPSY: FIBROCYSTIC CHANGE WITH ASSOCIATED MICROCALCIFICATIONS. PSEUDO-ANGIOMATOUS  STROMAL HYPERPLASIA. NEGATIVE FOR ATYPIA AND MALIGNANCY. DEEPER LEVELS WERE EXAMINED. Note: Results were called to Dr. Bary Castilla at 9:30 AM on 05/12/2017. This was found to be concordant by Dr. Shelly Bombard. Recommendation: Six month follow-up diagnostic left breast mammogram. The patient was contacted by phone by Jetta Lout, [place] on 05/13/17 for a post biopsy site check. The patient stated she did well following the biopsy. All of her questions were answered. She stated that results had been given to her by Dr. Bary Castilla. She was encouraged to contact the [facility] with any further questions or concerns. Addendum by Jetta Lout, RRA on 05/17/17. Electronically Signed   By: Everlean Alstrom M.D.   On: 05/17/2017 10:42   Result Date: 05/17/2017 CLINICAL DATA:  42 year old female with indeterminate calcifications in the lower inner right breast. EXAM: RIGHT BREAST STEREOTACTIC CORE NEEDLE BIOPSY COMPARISON:  Previous exams. FINDINGS: The patient and I discussed the procedure of stereotactic-guided biopsy including benefits and alternatives. We discussed the high likelihood of a successful procedure. We discussed the risks of the procedure including infection, bleeding, tissue injury, clip migration, and inadequate sampling. Informed written consent was given. The usual time out protocol was performed immediately prior to the procedure. Using sterile technique and 1% Lidocaine as local  anesthetic, under stereotactic guidance, a 9 gauge vacuum assisted device was used to perform core needle biopsy of the calcifications in the lower inner right breast using a medial to lateral approach. Specimen radiograph was performed showing the presence of calcifications. Specimens with calcifications are identified for pathology. Lesion quadrant: Lower inner At the conclusion of the procedure, an X shaped tissue marker clip was deployed into the biopsy cavity. Follow-up 2-view mammogram was performed and dictated separately. IMPRESSION: Stereotactic-guided biopsy of calcifications in the lower inner right breast. No apparent complications. Electronically Signed: By: Everlean Alstrom M.D. On: 05/10/2017 09:10       Assessment & Plan:   Problem List Items Addressed This Visit    GAD (generalized anxiety disorder)    On lexapro.  Overall handling things relatively well.  Follow.        Hypercholesterolemia    Low cholesterol diet and exercise.  Follow lipid panel.        Low back pain    Persistent pain.  S/p injection and prednisone.  Persistent pain.  Planning for MRI.            [name], MD

## 2017-11-05 ENCOUNTER — Encounter: Payer: Self-pay | Admitting: Internal Medicine

## 2017-11-05 NOTE — Assessment & Plan Note (Signed)
On lexapro.  Overall handling things relatively well.  Follow.

## 2017-11-05 NOTE — Assessment & Plan Note (Signed)
Low cholesterol diet and exercise.  Follow lipid panel.   

## 2017-11-05 NOTE — Assessment & Plan Note (Signed)
Persistent pain.  S/p injection and prednisone.  Persistent pain.  Planning for MRI.

## 2017-11-08 ENCOUNTER — Other Ambulatory Visit: Payer: BC Managed Care – PPO

## 2017-11-08 ENCOUNTER — Telehealth: Payer: Self-pay | Admitting: Internal Medicine

## 2017-11-08 NOTE — Telephone Encounter (Signed)
PT dropped off a pre-op form to be filled out and faxed to [facility]. Form is up front in Dr. Bary Leriche color folder.

## 2017-11-09 NOTE — Telephone Encounter (Signed)
Received pre-op form for lumbar decompression and fusion. No surgery date listed. Patient will need pre-op clearance. I have left her a message to call back so we can schedule. Will hold form until I speak with the patient. This is just an FYI for you.

## 2017-11-09 NOTE — Telephone Encounter (Signed)
Noted  

## 2017-11-09 NOTE — Telephone Encounter (Signed)
Patient was just seen by you on 11/01/17 and is supposed to have surgery end of August-beginning of September. Can we schedule her to come in for an EKG on the nurse schedule or do you want her to have a pre-op appt with you?

## 2017-11-09 NOTE — Telephone Encounter (Signed)
If it is possible for her to come in, I would like to see her and discuss surgery and pre op evaluation.  Let me know if this is a problem for her.

## 2017-11-10 ENCOUNTER — Other Ambulatory Visit: Payer: Self-pay | Admitting: Internal Medicine

## 2017-11-10 DIAGNOSIS — G8929 Other chronic pain: Secondary | ICD-10-CM

## 2017-11-10 DIAGNOSIS — M5441 Lumbago with sciatica, right side: Principal | ICD-10-CM

## 2017-11-10 NOTE — Telephone Encounter (Signed)
Patient is coming in on Monday. Ok to send in?

## 2017-11-10 NOTE — Telephone Encounter (Signed)
Since pt is seeing surgery and they are planning for surgery, can she see if they will refill her pain medication.  Is requesting lyrica.  Would like for them to control her pain meds, especially with upcaoming surgery.   Let me now if this is a problem.

## 2017-11-10 NOTE — Telephone Encounter (Signed)
Pt scheduled  

## 2017-11-11 NOTE — Telephone Encounter (Signed)
Spoke with patient and advised her to contact her surgeon about refilling pain meds. Patient stated she would do so. She has also rescheduled her appt to 9/5 because she will not be in town to come on Monday 8/12

## 2017-11-14 ENCOUNTER — Ambulatory Visit: Payer: BC Managed Care – PPO | Admitting: Internal Medicine

## 2017-12-08 ENCOUNTER — Ambulatory Visit: Payer: BC Managed Care – PPO | Admitting: Internal Medicine

## 2017-12-08 ENCOUNTER — Other Ambulatory Visit: Payer: Self-pay | Admitting: Internal Medicine

## 2017-12-08 VITALS — BP 122/78 | HR 81 | Temp 98.6°F | Resp 18 | Wt 195.8 lb

## 2017-12-08 DIAGNOSIS — M5441 Lumbago with sciatica, right side: Secondary | ICD-10-CM | POA: Diagnosis not present

## 2017-12-08 DIAGNOSIS — F411 Generalized anxiety disorder: Secondary | ICD-10-CM | POA: Diagnosis not present

## 2017-12-08 DIAGNOSIS — G8929 Other chronic pain: Secondary | ICD-10-CM | POA: Diagnosis not present

## 2017-12-08 DIAGNOSIS — Z01818 Encounter for other preprocedural examination: Secondary | ICD-10-CM

## 2017-12-08 NOTE — Progress Notes (Signed)
Patient ID: Ariel Morgan, female   DOB: 07/21/1975, 42 y.o.   MRN: [id number]   Subjective:    Patient ID: Ariel Morgan, female    DOB: 08/04/1975, 42 y.o.   MRN: [id number]  HPI  Patient here for pre op evaluation.  Planning to have lumbar decompression and fusion.  She reports that other than the issues related to her back, she is doing relatively well.  Some increased stress with school starting back, but feels she is handling things relatively well.  No chest pain.  No sob.  No acid reflux.  No abdominal pain.  Bowels moving.  No urine change.  Never had any problems with anesthesia.     Past Medical History:  Diagnosis Date  . Anemia   . Frequent headaches    H/O  . GERD (gastroesophageal reflux disease)   . Hypercholesterolemia   . Placenta previa    Past Surgical History:  Procedure Laterality Date  . BREAST BIOPSY Right 05/10/2017   right breast stereotatic bx path pending  . CESAREAN SECTION  2003 & 2010  . CHOLECYSTECTOMY  2005   Family History  Problem Relation Age of Onset  . Hyperlipidemia Father   . Diabetes Sister   . Diabetes Maternal Grandmother   . Cancer Maternal Grandmother        vaginal  . Cirrhosis Maternal Grandmother   . Colon polyps Maternal Grandmother   . Breast cancer Maternal Grandmother        Early 82's   Social History   Socioeconomic History  . Marital status: Married    Spouse name: Not on file  . Number of children: 3  . Years of education: Not on file  . Highest education level: Not on file  Occupational History  . Not on file  Social Needs  . Financial resource strain: Not on file  . Food insecurity:    Worry: Not on file    Inability: Not on file  . Transportation needs:    Medical: Not on file    Non-medical: Not on file  Tobacco Use  . Smoking status: Former Smoker    Packs/day: 1.00    Types: Cigarettes  . Smokeless tobacco: Never Used  Substance and Sexual Activity  . Alcohol use: Yes    Alcohol/week:  0.0 standard drinks    Comment: rarely  . Drug use: No  . Sexual activity: Not on file  Lifestyle  . Physical activity:    Days per week: Not on file    Minutes per session: Not on file  . Stress: Not on file  Relationships  . Social connections:    Talks on phone: Not on file    Gets together: Not on file    Attends religious service: Not on file    Active member of club or organization: Not on file    Attends meetings of clubs or organizations: Not on file    Relationship status: Not on file  Other Topics Concern  . Not on file  Social History Narrative  . Not on file    Outpatient Encounter Medications as of 12/08/2017  Medication Sig  . ALPRAZolam (XANAX) 0.25 MG tablet Take 1 tablet (0.25 mg total) by mouth daily as needed. for anxiety  . escitalopram (LEXAPRO) 20 MG tablet TAKE 1 TABLET BY MOUTH EVERY DAY  . pregabalin (LYRICA) 75 MG capsule TAKE ONE CAPSULE BY MOUTH TWICE A DAY AS NEEDED  . tiZANidine (ZANAFLEX) 4 MG tablet TAKE  1 TABLET (4 MG TOTAL) BY MOUTH AT BEDTIME AS NEEDED FOR MUSCLE SPASMS.   No facility-administered encounter medications on file as of 12/08/2017.     Review of Systems  Constitutional: Negative for appetite change and unexpected weight change.  HENT: Negative for congestion and sinus pressure.   Respiratory: Negative for cough, chest tightness and shortness of breath.   Cardiovascular: Negative for chest pain, palpitations and leg swelling.  Gastrointestinal: Negative for abdominal pain, diarrhea, nausea and vomiting.  Genitourinary: Negative for difficulty urinating and dysuria.  Musculoskeletal: Negative for joint swelling and myalgias.       Persistent symptoms related to her back.    Skin: Negative for color change and rash.  Neurological: Negative for dizziness, light-headedness and headaches.  Psychiatric/Behavioral: Negative for agitation and dysphoric mood.       Objective:    Physical Exam  Constitutional: She appears  well-developed and well-nourished. No distress.  HENT:  Nose: Nose normal.  Mouth/Throat: Oropharynx is clear and moist.  Neck: Neck supple. No thyromegaly present.  Cardiovascular: Normal rate and regular rhythm.  Pulmonary/Chest: Breath sounds normal. No respiratory distress. She has no wheezes.  Abdominal: Soft. Bowel sounds are normal. There is no tenderness.  Musculoskeletal: She exhibits no edema or tenderness.  Lymphadenopathy:    She has no cervical adenopathy.  Skin: No rash noted. No erythema.  Psychiatric: She has a normal mood and affect. Her behavior is normal.    BP 122/78 (BP Location: Left Arm, Patient Position: Sitting, Cuff Size: Normal)   Pulse 81   Temp 98.6 F (37 C) (Oral)   Resp 18   Wt 195 lb 12.8 oz (88.8 kg)   SpO2 99%   BMI 35.81 kg/m  Wt Readings from Last 3 Encounters:  12/08/17 195 lb 12.8 oz (88.8 kg)  11/01/17 197 lb 3.2 oz (89.4 kg)  07/18/17 194 lb 12.8 oz (88.4 kg)     Lab Results  Component Value Date   WBC 9.9 04/26/2017   HGB 14.4 04/26/2017   HCT 42.0 04/26/2017   PLT 377.0 04/26/2017   GLUCOSE 89 04/26/2017   CHOL 201 (H) 04/26/2017   TRIG 158.0 (H) 04/26/2017   HDL 60.60 04/26/2017   LDLDIRECT 132.6 01/29/2013   LDLCALC 109 (H) 04/26/2017   ALT 27 04/26/2017   AST 18 04/26/2017   NA 139 04/26/2017   K 4.3 04/26/2017   CL 104 04/26/2017   CREATININE 0.73 04/26/2017   BUN 12 04/26/2017   CO2 30 04/26/2017   TSH 1.86 04/26/2017    Mm Clip Placement Right  Result Date: 05/10/2017 CLINICAL DATA:  Post stereotactic guided biopsy of calcifications in the lower inner right breast. EXAM: DIAGNOSTIC RIGHT MAMMOGRAM POST STEREOTACTIC BIOPSY COMPARISON:  Previous exam(s). FINDINGS: Mammographic images were obtained following stereotactic guided biopsy of calcifications in the lower inner right breast. And X shaped biopsy marking clip is present at the site of the biopsied calcifications in the lower slightly inner right breast.  IMPRESSION: X shaped biopsy marking clip appropriately positioned at site of biopsied calcifications in the lower inner right breast. Final Assessment: Post Procedure Mammograms for Marker Placement Electronically Signed   By: Everlean Alstrom M.D.   On: 05/10/2017 09:09   Mm Rt Breast Bx W Loc Dev 1st Lesion Image Bx Spec Stereo Guide  Addendum Date: 05/17/2017   ADDENDUM REPORT: 05/17/2017 10:42 ADDENDUM: Pathology of the right breast biopsy revealed A. RIGHT LOWER INNER BREAST; STEREOTACTIC BIOPSY: FIBROCYSTIC CHANGE WITH ASSOCIATED MICROCALCIFICATIONS. PSEUDO-ANGIOMATOUS  STROMAL HYPERPLASIA. NEGATIVE FOR ATYPIA AND MALIGNANCY. DEEPER LEVELS WERE EXAMINED. Note: Results were called to Dr. Bary Castilla at 9:30 AM on 05/12/2017. This was found to be concordant by Dr. Shelly Bombard. Recommendation: Six month follow-up diagnostic left breast mammogram. The patient was contacted by phone by Jetta Lout, [place] on 05/13/17 for a post biopsy site check. The patient stated she did well following the biopsy. All of her questions were answered. She stated that results had been given to her by Dr. Bary Castilla. She was encouraged to contact the [facility] with any further questions or concerns. Addendum by Jetta Lout, RRA on 05/17/17. Electronically Signed   By: Everlean Alstrom M.D.   On: 05/17/2017 10:42   Result Date: 05/17/2017 CLINICAL DATA:  42 year old female with indeterminate calcifications in the lower inner right breast. EXAM: RIGHT BREAST STEREOTACTIC CORE NEEDLE BIOPSY COMPARISON:  Previous exams. FINDINGS: The patient and I discussed the procedure of stereotactic-guided biopsy including benefits and alternatives. We discussed the high likelihood of a successful procedure. We discussed the risks of the procedure including infection, bleeding, tissue injury, clip migration, and inadequate sampling. Informed written consent was given. The usual time out protocol was performed immediately prior to the procedure. Using  sterile technique and 1% Lidocaine as local anesthetic, under stereotactic guidance, a 9 gauge vacuum assisted device was used to perform core needle biopsy of the calcifications in the lower inner right breast using a medial to lateral approach. Specimen radiograph was performed showing the presence of calcifications. Specimens with calcifications are identified for pathology. Lesion quadrant: Lower inner At the conclusion of the procedure, an X shaped tissue marker clip was deployed into the biopsy cavity. Follow-up 2-view mammogram was performed and dictated separately. IMPRESSION: Stereotactic-guided biopsy of calcifications in the lower inner right breast. No apparent complications. Electronically Signed: By: Everlean Alstrom M.D. On: 05/10/2017 09:10       Assessment & Plan:   Problem List Items Addressed This Visit    GAD (generalized anxiety disorder)    On lexapro.  Doing well.  Follow.        Low back pain    Seeing ortho.  Planning for lumbar decompression and fusion as outlined.        Pre-op evaluation - Primary    Planning for lumbar decompression and fusion.  Reports no chest pain or sob.  EKG - SR with no acute ischemic changes.  I feel that she is at low risk from a cardiac standpoint to proceed with planned surgery.  Will need intra op and post op monitoring of heart rate and blood pressure to avoid extremes.        Relevant Orders   EKG 12-Lead (Completed)       [name], MD

## 2017-12-08 NOTE — Progress Notes (Signed)
ekg 

## 2017-12-09 ENCOUNTER — Other Ambulatory Visit: Payer: BC Managed Care – PPO

## 2017-12-09 ENCOUNTER — Encounter: Payer: Self-pay | Admitting: Internal Medicine

## 2017-12-09 MED ORDER — OSELTAMIVIR PHOSPHATE 75 MG PO CAPS: 75.0000 mg | ORAL_CAPSULE | Freq: Every day | ORAL | 0 refills | Status: DC

## 2017-12-09 NOTE — Telephone Encounter (Signed)
Discussed with pt.  Her son has been diagnosed with flu.  She  Has upcoming surgery.  No active symptoms now.  Called in tamiflu daily for 10 days.  If symptoms will do treatment dose.

## 2017-12-11 ENCOUNTER — Encounter: Payer: Self-pay | Admitting: Internal Medicine

## 2017-12-11 DIAGNOSIS — Z01818 Encounter for other preprocedural examination: Secondary | ICD-10-CM | POA: Insufficient documentation

## 2017-12-11 NOTE — Assessment & Plan Note (Signed)
Planning for lumbar decompression and fusion.  Reports no chest pain or sob.  EKG - SR with no acute ischemic changes.  I feel that she is at low risk from a cardiac standpoint to proceed with planned surgery.  Will need intra op and post op monitoring of heart rate and blood pressure to avoid extremes.

## 2017-12-11 NOTE — Assessment & Plan Note (Signed)
Seeing ortho.  Planning for lumbar decompression and fusion as outlined.

## 2017-12-11 NOTE — Assessment & Plan Note (Signed)
On lexapro.  Doing well.  Follow.   

## 2017-12-19 ENCOUNTER — Encounter (HOSPITAL_COMMUNITY): Payer: Self-pay

## 2017-12-19 ENCOUNTER — Other Ambulatory Visit: Payer: Self-pay

## 2017-12-19 ENCOUNTER — Encounter (HOSPITAL_COMMUNITY)
Admission: RE | Admit: 2017-12-19 | Discharge: 2017-12-19 | Disposition: A | Discharge status: Home / Self Care | Payer: BC Managed Care – PPO | Source: Ambulatory Visit | Attending: Orthopedic Surgery | Admitting: Orthopedic Surgery

## 2017-12-19 DIAGNOSIS — Z01812 Encounter for preprocedural laboratory examination: Secondary | ICD-10-CM | POA: Insufficient documentation

## 2017-12-19 DIAGNOSIS — M5137 Other intervertebral disc degeneration, lumbosacral region: Secondary | ICD-10-CM | POA: Diagnosis not present

## 2017-12-19 DIAGNOSIS — G43909 Migraine, unspecified, not intractable, without status migrainosus: Secondary | ICD-10-CM | POA: Insufficient documentation

## 2017-12-19 DIAGNOSIS — F419 Anxiety disorder, unspecified: Secondary | ICD-10-CM | POA: Insufficient documentation

## 2017-12-19 DIAGNOSIS — D649 Anemia, unspecified: Secondary | ICD-10-CM | POA: Diagnosis not present

## 2017-12-19 DIAGNOSIS — Z79899 Other long term (current) drug therapy: Secondary | ICD-10-CM | POA: Insufficient documentation

## 2017-12-19 DIAGNOSIS — K219 Gastro-esophageal reflux disease without esophagitis: Secondary | ICD-10-CM | POA: Diagnosis not present

## 2017-12-19 DIAGNOSIS — Z87891 Personal history of nicotine dependence: Secondary | ICD-10-CM | POA: Diagnosis not present

## 2017-12-19 HISTORY — DX: Anxiety disorder, unspecified: F41.9

## 2017-12-19 HISTORY — DX: Unspecified osteoarthritis, unspecified site: M19.90

## 2017-12-19 LAB — CBC
HEMATOCRIT: 40.3 % (ref 36.0–46.0)
Hemoglobin: 13.5 g/dL (ref 12.0–15.0)
MCH: 30.1 pg (ref 26.0–34.0)
MCHC: 33.5 g/dL (ref 30.0–36.0)
MCV: 89.8 fL (ref 78.0–100.0)
Platelets: 342 10*3/uL (ref 150–400)
RBC: 4.49 MIL/uL (ref 3.87–5.11)
RDW: 12.2 % (ref 11.5–15.5)
WBC: 8.7 10*3/uL (ref 4.0–10.5)

## 2017-12-19 LAB — BASIC METABOLIC PANEL
Anion gap: 12 (ref 5–15)
BUN: 9 mg/dL (ref 6–20)
CALCIUM: 9.2 mg/dL (ref 8.9–10.3)
CO2: 23 mmol/L (ref 22–32)
CREATININE: 0.83 mg/dL (ref 0.44–1.00)
Chloride: 105 mmol/L (ref 98–111)
Glucose, Bld: 85 mg/dL (ref 70–99)
Potassium: 3.9 mmol/L (ref 3.5–5.1)
SODIUM: 140 mmol/L (ref 135–145)

## 2017-12-19 LAB — SURGICAL PCR SCREEN
MRSA, PCR: NEGATIVE
Staphylococcus aureus: NEGATIVE

## 2017-12-19 LAB — TYPE AND SCREEN
ABO/RH(D): O POS
ANTIBODY SCREEN: NEGATIVE

## 2017-12-19 LAB — ABO/RH: ABO/RH(D): O POS

## 2017-12-19 NOTE — Progress Notes (Addendum)
PCP - [name], MD Cardiologist - denies  Chest x-ray - N/A EKG - 12/08/17 Stress Test -denies  ECHO - denies Cardiac Cath - denies  Sleep Study - denies  Labs: CBC, BMET, T/S PCR Anesthesia review: Yes, per Dr. Rolena Infante order  Patient denies shortness of breath, fever, cough and chest pain at PAT appointment   Patient verbalized understanding of instructions that were given to them at the PAT appointment. Patient was also instructed that they will need to review over the PAT instructions again at home before surgery.

## 2017-12-19 NOTE — Pre-Procedure Instructions (Signed)
Ariel Morgan  12/19/2017      [id number] - [place], [place] - [address] [place] [address] Phone: [phone] Fax: [phone]    Your procedure is scheduled on Wednesday, September 25th.  Report to [facility] Admitting at 5:30 A.M.  Call this number if you have problems the morning of surgery:  [phone]   Remember:  Do not eat or drink after midnight.      Take these medicines the morning of surgery with A SIP OF WATER   escitalopram (LEXAPRO) As needed:  pregabalin (LYRICA)  7 days prior to surgery STOP taking any Aspirin(unless otherwise instructed by your surgeon), Aleve, Naproxen, Ibuprofen, Motrin, Advil, Goody's, BC's, all herbal medications, fish oil, and all vitamins    Do not wear jewelry, make-up or nail polish.  Do not wear lotions, powders, or perfumes, or deodorant.  Do not shave 48 hours prior to surgery.    Do not bring valuables to the hospital.  [facility] is not responsible for any belongings or valuables.  Contacts, dentures or bridgework may not be worn into surgery.  Leave your suitcase in the car.  After surgery it may be brought to your room.  For patients admitted to the hospital, discharge time will be determined by your treatment team.  Patients discharged the day of surgery will not be allowed to drive home.   Special instructions:   [place]- Preparing For Surgery  Before surgery, you can play an important role. Because skin is not sterile, your skin needs to be as free of germs as possible. You can reduce the number of germs on your skin by washing with CHG (chlorahexidine gluconate) Soap before surgery.  CHG is an antiseptic cleaner which kills germs and bonds with the skin to continue killing germs even after washing.    Oral Hygiene is also important to reduce your risk of infection.  Remember - BRUSH YOUR TEETH THE MORNING OF SURGERY WITH YOUR REGULAR TOOTHPASTE  Please do not  use if you have an allergy to CHG or antibacterial soaps. If your skin becomes reddened/irritated stop using the CHG.  Do not shave (including legs and underarms) for at least 48 hours prior to first CHG shower. It is OK to shave your face.  Please follow these instructions carefully.   1. Shower the NIGHT BEFORE SURGERY and the MORNING OF SURGERY with CHG.   2. If you chose to wash your hair, wash your hair first as usual with your normal shampoo.  3. After you shampoo, rinse your hair and body thoroughly to remove the shampoo.  4. Use CHG as you would any other liquid soap. You can apply CHG directly to the skin and wash gently with a scrungie or a clean washcloth.   5. Apply the CHG Soap to your body ONLY FROM THE NECK DOWN.  Do not use on open wounds or open sores. Avoid contact with your eyes, ears, mouth and genitals (private parts). Wash Face and genitals (private parts)  with your normal soap.  6. Wash thoroughly, paying special attention to the area where your surgery will be performed.  7. Thoroughly rinse your body with warm water from the neck down.  8. DO NOT shower/wash with your normal soap after using and rinsing off the CHG Soap.  9. Pat yourself dry with a CLEAN TOWEL.  10. Wear CLEAN PAJAMAS to bed the night before surgery, wear comfortable clothes  the morning of surgery  11. Place CLEAN SHEETS on your bed the night of your first shower and DO NOT SLEEP WITH PETS.    Day of Surgery:  Do not apply any deodorants/lotions.  Please wear clean clothes to the hospital/surgery center.   Remember to brush your teeth WITH YOUR REGULAR TOOTHPASTE.   Please read over the following fact sheets that you were given.

## 2017-12-20 NOTE — H&P (Addendum)
Patient ID: Ariel Morgan MRN: [id number] DOB/AGE: 1976/03/21 42 y.o.  Admit date: (Not on file)  Admission Diagnoses:  Lumbar Spondylolisthesis and DDD  HPI: Very pleasant 42 year old female patient presents to clinic for history and physical prior to her L5-S1 Gill decompression with fusion.  Pt reports a hx of good health.  Past Medical History: Past Medical History:  Diagnosis Date  . Anemia   . Anxiety   . Arthritis   . Frequent headaches    H/O  . GERD (gastroesophageal reflux disease)   . Hypercholesterolemia   . Placenta previa     Surgical History: Past Surgical History:  Procedure Laterality Date  . BREAST BIOPSY Right 05/10/2017   right breast stereotatic bx path pending  . CESAREAN SECTION  2003 & 2010  . CHOLECYSTECTOMY  2005    Family History: Family History  Problem Relation Age of Onset  . Hyperlipidemia Father   . Diabetes Sister   . Diabetes Maternal Grandmother   . Cancer Maternal Grandmother        vaginal  . Cirrhosis Maternal Grandmother   . Colon polyps Maternal Grandmother   . Breast cancer Maternal Grandmother        Early 10's    Social History: Social History   Socioeconomic History  . Marital status: Married    Spouse name: Not on file  . Number of children: 3  . Years of education: Not on file  . Highest education level: Not on file  Occupational History  . Not on file  Social Needs  . Financial resource strain: Not on file  . Food insecurity:    Worry: Not on file    Inability: Not on file  . Transportation needs:    Medical: Not on file    Non-medical: Not on file  Tobacco Use  . Smoking status: Former Smoker    Packs/day: 1.00    Types: Cigarettes  . Smokeless tobacco: Never Used  Substance and Sexual Activity  . Alcohol use: Yes    Alcohol/week: 0.0 standard drinks    Comment: rarely  . Drug use: No  . Sexual activity: Not on file  Lifestyle  . Physical activity:    Days per week: Not on file    Minutes per session: Not on file  . Stress: Not on file  Relationships  . Social connections:    Talks on phone: Not on file    Gets together: Not on file    Attends religious service: Not on file    Active member of club or organization: Not on file    Attends meetings of clubs or organizations: Not on file    Relationship status: Not on file  . Intimate partner violence:    Fear of current or ex partner: Not on file    Emotionally abused: Not on file    Physically abused: Not on file    Forced sexual activity: Not on file  Other Topics Concern  . Not on file  Social History Narrative  . Not on file    Allergies: Patient has no known allergies.  Medications: I have reviewed the patient's current medications.  Vital Signs: No data found.  Radiology: No results found.  Labs: Recent Labs    12/19/17 1458  WBC 8.7  RBC 4.49  HCT 40.3  PLT 342   Recent Labs    12/19/17 1458  NA 140  K 3.9  CL 105  CO2 23  BUN 9  CREATININE 0.83  GLUCOSE 85  CALCIUM 9.2   No results for input(s): LABPT, INR in the last 72 hours.  Review of Systems: ROS  Physical Exam: There is no height or weight on file to calculate BMI.  Physical Exam  Constitutional: She is oriented to person, place, and time. She appears well-developed and well-nourished.  Neck: Normal range of motion.  Cardiovascular: Normal rate and regular rhythm.  Respiratory: Effort normal and breath sounds normal.  GI: Soft. Bowel sounds are normal.  Neurological: She is alert and oriented to person, place, and time.  Skin: Skin is warm and dry.  Psychiatric: She has a normal mood and affect. Her behavior is normal. Judgment and thought content normal.   Lumbar spine: She continues to have severe debilitating low back pain that has progressed since her last visit with me in January 2019.  Pain radiates into the buttock and into both legs but the right is significantly worse compared to the left.  No  significant hip, knee, ankle pain with isolated joint range of motion.  No significant SI joint pain with direct palpation.  She has horrific pain when she forward flexes some relief with extension. Neuro: Positive nerve root tension sign in the right S1 distribution.  No focal motor deficits in the lower extremity.  Numbness and dysesthesias bilaterally but the right L5-S1 dermatome is most obvious.  1+ symmetrical deep tendon reflexes in the lower extremity Reflexes: Babinski: Negative   Vascular: Lower extremity peripheral pulses are 2+ and symmetrical.  Compartments are soft and nontender  Imaging studies: 5 views of the lumbar spine demonstrate a grade 2 borderline 3 isthmic spondylolisthesis at L5-S1.  There is advanced degenerative disc disease at L5-S1.  The slip appears to be stable with flexion and extension.  No scoliosis is noted.  lumbar MRI completed on 11/04/17: No significant pathology L1-L5.  Grade 2/3 spondylolisthesis L5 on S1 due to chronic bilateral spondylosis.  Significant lateral recess and foraminal stenosis bilaterally.  Marked compression of the exiting L5 nerve root and traversing S1 nerve root.  No acute fracture is seen.  No significant change to her clinical exam from her last visit on 10/10/17.  Assessment and Plan: Risks and benefits of surgery were discussed with the patient. These include: Infection, bleeding, death, stroke, paralysis, ongoing or worse pain, need for additional surgery, nonunion, leak of spinal fluid, adjacent segment degeneration requiring additional fusion surgery, Injury to abdominal vessels that can require anterior surgery to stop bleeding. Malposition of the cage and/or pedicle screws that could require additional surgery. Loss of bowel and bladder control. Postoperative hematoma causing neurologic compression that could require urgent or emergent re-operation.  Goals of surgery: Reduced (not eliminated) pain, and improved quality of life.  Ronette Deter, PAC for Melina Schools, MD [facility] [phone]  Patient's clinical exam is unchanged from her last office visit of 12/20/2017.  She continues to have bilateral radicular L5 and S1 leg pain and significant back pain.  Conservative care is failed to alleviate her symptoms.  Surgical plan is open L5-S1 decompression with complete laminectomy.  This will be followed by posterior pedicle screw fixation at L5-S1.  If the structural abnormality prohibits proper alignment of the pedicle screws or there is ongoing residual compression at the junction of L4 and I may need to extend the decompression fusion to the L4 level.  This will be decided intraoperatively.  Patient and her husband are aware of this and all of the risks  and benefits were discussed.  Consent was signed.

## 2017-12-20 NOTE — Progress Notes (Signed)
Anesthesia Chart Review:  Case:  [id number] Date/Time:  12/28/17 0715   Procedure:  L5-S1 Gill decompression and fusion (N/A ) - 4.5 hrs   Anesthesia type:  General   Pre-op diagnosis:  Grade 3 isthesis slip with degenerative disc disease L5-1   Location:  [facility] OR ROOM 04 / [facility] OR   Surgeon:  Melina Schools, MD      DISCUSSION: 42 yo female former smoker for above procedure. Pertinent hx includes GERD, Anemia, Anxiety, Migraines.  She has clearance from her PCP Dr. [name] 12/08/2017 stating "Planning for lumbar decompression and fusion.  Reports no chest pain or sob.  EKG - SR with no acute ischemic changes.  I feel that she is at low risk from a cardiac standpoint to proceed with planned surgery.  Will need intra op and post op monitoring of heart rate and blood pressure to avoid extremes."  Anticipate she can proceed as planned barring acute status change.  VS: BP 118/76   Pulse 83   Temp 36.8 C   Resp 18   Ht 5\' 2"  (1.575 m)   Wt 87.9 kg   SpO2 99%   BMI 35.43 kg/m   PROVIDERS: [name], MD is PCP last seen 12/08/2017   LABS: Labs reviewed: Acceptable for surgery. (all labs ordered are listed, but only abnormal results are displayed)  Labs Reviewed  SURGICAL PCR SCREEN  CBC  BASIC METABOLIC PANEL  TYPE AND SCREEN  ABO/RH     IMAGES: N/A   EKG: 12/08/2017: Sinus  Rhythm.  Low voltage in precordial leads.  -RSR(V1) -nondiagnostic.   CV: N/A  Past Medical History:  Diagnosis Date  . Anemia   . Anxiety   . Arthritis   . Frequent headaches    H/O  . GERD (gastroesophageal reflux disease)   . Hypercholesterolemia   . Placenta previa     Past Surgical History:  Procedure Laterality Date  . BREAST BIOPSY Right 05/10/2017   right breast stereotatic bx path pending  . CESAREAN SECTION  2003 & 2010  . CHOLECYSTECTOMY  2005    MEDICATIONS: . escitalopram (LEXAPRO) 20 MG tablet  . pregabalin (LYRICA) 75 MG capsule  . tiZANidine (ZANAFLEX) 4 MG tablet    No current facility-administered medications for this encounter.      Wynonia Musty [facility] Short Stay Center/Anesthesiology Phone [phone] 12/20/2017 1:01 PM

## 2017-12-27 NOTE — Anesthesia Preprocedure Evaluation (Addendum)
Anesthesia Evaluation  Patient identified by MRN, date of birth, ID band Patient awake    Reviewed: Allergy & Precautions, H&P , NPO status , Patient's Chart, lab work & pertinent test results  Airway Mallampati: III  TM Distance: >3 FB Neck ROM: Full    Dental no notable dental hx. (+) Teeth Intact, Dental Advisory Given   Pulmonary neg pulmonary ROS, former smoker,    Pulmonary exam normal breath sounds clear to auscultation       Cardiovascular Exercise Tolerance: Good negative cardio ROS   Rhythm:Regular Rate:Normal     Neuro/Psych  Headaches, Anxiety    GI/Hepatic Neg liver ROS, GERD  Medicated and Controlled,  Endo/Other  negative endocrine ROS  Renal/GU negative Renal ROS  negative genitourinary   Musculoskeletal  (+) Arthritis , Osteoarthritis,    Abdominal   Peds  Hematology negative hematology ROS (+)   Anesthesia Other Findings   Reproductive/Obstetrics negative OB ROS                            Anesthesia Physical Anesthesia Plan  ASA: II  Anesthesia Plan: General   Post-op Pain Management:    Induction: Intravenous  PONV Risk Score and Plan: 4 or greater and Ondansetron, Dexamethasone and Midazolam  Airway Management Planned: Oral ETT  Additional Equipment:   Intra-op Plan:   Post-operative Plan: Extubation in OR  Informed Consent: I have reviewed the patients History and Physical, chart, labs and discussed the procedure including the risks, benefits and alternatives for the proposed anesthesia with the patient or authorized representative who has indicated his/her understanding and acceptance.   Dental advisory given  Plan Discussed with: CRNA  Anesthesia Plan Comments:         Anesthesia Quick Evaluation

## 2017-12-28 ENCOUNTER — Inpatient Hospital Stay (HOSPITAL_COMMUNITY): Payer: BC Managed Care – PPO

## 2017-12-28 ENCOUNTER — Encounter (HOSPITAL_COMMUNITY): Payer: Self-pay

## 2017-12-28 ENCOUNTER — Other Ambulatory Visit: Payer: Self-pay

## 2017-12-28 ENCOUNTER — Inpatient Hospital Stay (HOSPITAL_COMMUNITY)
Admission: RE | Admit: 2017-12-28 | Discharge: 2018-01-04 | DRG: 454 | Disposition: A | Discharge status: Home Health | Payer: BC Managed Care – PPO | Source: Ambulatory Visit | Attending: Orthopedic Surgery | Admitting: Orthopedic Surgery

## 2017-12-28 ENCOUNTER — Inpatient Hospital Stay (HOSPITAL_COMMUNITY): Payer: BC Managed Care – PPO | Admitting: Anesthesiology

## 2017-12-28 ENCOUNTER — Encounter (HOSPITAL_COMMUNITY)
Admission: RE | Disposition: A | Discharge status: Home Health | Payer: Self-pay | Source: Ambulatory Visit | Attending: Orthopedic Surgery

## 2017-12-28 ENCOUNTER — Inpatient Hospital Stay (HOSPITAL_COMMUNITY): Payer: BC Managed Care – PPO | Admitting: Physician Assistant

## 2017-12-28 DIAGNOSIS — R928 Other abnormal and inconclusive findings on diagnostic imaging of breast: Secondary | ICD-10-CM

## 2017-12-28 DIAGNOSIS — Z419 Encounter for procedure for purposes other than remedying health state, unspecified: Secondary | ICD-10-CM

## 2017-12-28 DIAGNOSIS — E7800 Pure hypercholesterolemia, unspecified: Secondary | ICD-10-CM | POA: Diagnosis present

## 2017-12-28 DIAGNOSIS — Z79899 Other long term (current) drug therapy: Secondary | ICD-10-CM | POA: Diagnosis not present

## 2017-12-28 DIAGNOSIS — M5117 Intervertebral disc disorders with radiculopathy, lumbosacral region: Secondary | ICD-10-CM | POA: Diagnosis present

## 2017-12-28 DIAGNOSIS — M4316 Spondylolisthesis, lumbar region: Secondary | ICD-10-CM | POA: Diagnosis present

## 2017-12-28 DIAGNOSIS — D62 Acute posthemorrhagic anemia: Secondary | ICD-10-CM | POA: Diagnosis not present

## 2017-12-28 DIAGNOSIS — E785 Hyperlipidemia, unspecified: Secondary | ICD-10-CM

## 2017-12-28 DIAGNOSIS — Z9049 Acquired absence of other specified parts of digestive tract: Secondary | ICD-10-CM | POA: Diagnosis not present

## 2017-12-28 DIAGNOSIS — R509 Fever, unspecified: Secondary | ICD-10-CM | POA: Diagnosis not present

## 2017-12-28 DIAGNOSIS — M4317 Spondylolisthesis, lumbosacral region: Secondary | ICD-10-CM | POA: Diagnosis present

## 2017-12-28 DIAGNOSIS — R51 Headache: Secondary | ICD-10-CM | POA: Diagnosis not present

## 2017-12-28 DIAGNOSIS — K219 Gastro-esophageal reflux disease without esophagitis: Secondary | ICD-10-CM | POA: Diagnosis present

## 2017-12-28 DIAGNOSIS — M21371 Foot drop, right foot: Secondary | ICD-10-CM | POA: Diagnosis not present

## 2017-12-28 DIAGNOSIS — M5417 Radiculopathy, lumbosacral region: Secondary | ICD-10-CM | POA: Diagnosis not present

## 2017-12-28 DIAGNOSIS — G8918 Other acute postprocedural pain: Secondary | ICD-10-CM

## 2017-12-28 DIAGNOSIS — M5116 Intervertebral disc disorders with radiculopathy, lumbar region: Secondary | ICD-10-CM | POA: Diagnosis present

## 2017-12-28 DIAGNOSIS — F411 Generalized anxiety disorder: Secondary | ICD-10-CM | POA: Diagnosis present

## 2017-12-28 DIAGNOSIS — M48061 Spinal stenosis, lumbar region without neurogenic claudication: Principal | ICD-10-CM | POA: Diagnosis present

## 2017-12-28 DIAGNOSIS — Z981 Arthrodesis status: Secondary | ICD-10-CM | POA: Diagnosis not present

## 2017-12-28 DIAGNOSIS — Z87891 Personal history of nicotine dependence: Secondary | ICD-10-CM

## 2017-12-28 DIAGNOSIS — Z9889 Other specified postprocedural states: Secondary | ICD-10-CM | POA: Diagnosis not present

## 2017-12-28 HISTORY — PX: LUMBAR LAMINECTOMY/DECOMPRESSION MICRODISCECTOMY: SHX5026

## 2017-12-28 SURGERY — LUMBAR LAMINECTOMY/DECOMPRESSION MICRODISCECTOMY 1 LEVEL
Anesthesia: General | Site: Spine Lumbar

## 2017-12-28 MED ORDER — MIDAZOLAM HCL 2 MG/2ML IJ SOLN
INTRAMUSCULAR | Status: AC
  Filled 2017-12-28: qty 2

## 2017-12-28 MED ORDER — LACTATED RINGERS IV SOLN
INTRAVENOUS | Status: DC | PRN
  Administered 2017-12-28 (×3): via INTRAVENOUS

## 2017-12-28 MED ORDER — MAGNESIUM CITRATE PO SOLN
1.0000 | Freq: Once | ORAL | Status: AC | PRN
  Administered 2017-12-29: 1 via ORAL
  Filled 2017-12-28: qty 296

## 2017-12-28 MED ORDER — HYDROMORPHONE HCL 1 MG/ML IJ SOLN
0.2500 mg | INTRAMUSCULAR | Status: DC | PRN
  Administered 2017-12-28 (×4): 0.5 mg via INTRAVENOUS

## 2017-12-28 MED ORDER — HYDROMORPHONE HCL 1 MG/ML IJ SOLN
INTRAMUSCULAR | Status: AC
  Filled 2017-12-28: qty 1

## 2017-12-28 MED ORDER — PROPOFOL 10 MG/ML IV BOLUS
INTRAVENOUS | Status: AC
  Filled 2017-12-28: qty 20

## 2017-12-28 MED ORDER — ROCURONIUM BROMIDE 50 MG/5ML IV SOSY
PREFILLED_SYRINGE | INTRAVENOUS | Status: AC
  Filled 2017-12-28: qty 5

## 2017-12-28 MED ORDER — LIDOCAINE 2% (20 MG/ML) 5 ML SYRINGE
INTRAMUSCULAR | Status: DC | PRN
  Administered 2017-12-28: 60 mg via INTRAVENOUS

## 2017-12-28 MED ORDER — OXYCODONE HCL 5 MG PO TABS
ORAL_TABLET | ORAL | Status: AC
  Filled 2017-12-28: qty 2

## 2017-12-28 MED ORDER — ONDANSETRON 4 MG PO TBDP: 4.0000 mg | ORAL_TABLET | Freq: Three times a day (TID) | ORAL | 0 refills | Status: DC | PRN

## 2017-12-28 MED ORDER — FENTANYL CITRATE (PF) 250 MCG/5ML IJ SOLN
INTRAMUSCULAR | Status: AC
  Filled 2017-12-28: qty 5

## 2017-12-28 MED ORDER — THROMBIN (RECOMBINANT) 20000 UNITS EX SOLR
CUTANEOUS | Status: AC
  Filled 2017-12-28: qty 20000

## 2017-12-28 MED ORDER — ONDANSETRON HCL 4 MG/2ML IJ SOLN
4.0000 mg | Freq: Four times a day (QID) | INTRAMUSCULAR | Status: DC | PRN
  Administered 2017-12-28: 4 mg via INTRAVENOUS
  Filled 2017-12-28: qty 2

## 2017-12-28 MED ORDER — ROCURONIUM BROMIDE 50 MG/5ML IV SOSY
PREFILLED_SYRINGE | INTRAVENOUS | Status: DC | PRN
  Administered 2017-12-28: 50 mg via INTRAVENOUS
  Administered 2017-12-28: 30 mg via INTRAVENOUS
  Administered 2017-12-28: 20 mg via INTRAVENOUS
  Administered 2017-12-28 (×2): 50 mg via INTRAVENOUS

## 2017-12-28 MED ORDER — EPHEDRINE SULFATE-NACL 50-0.9 MG/10ML-% IV SOSY
PREFILLED_SYRINGE | INTRAVENOUS | Status: DC | PRN
  Administered 2017-12-28: 5 mg via INTRAVENOUS

## 2017-12-28 MED ORDER — ACETAMINOPHEN 325 MG PO TABS
650.0000 mg | ORAL_TABLET | ORAL | Status: DC | PRN
  Administered 2017-12-28 – 2017-12-30 (×5): 650 mg via ORAL
  Filled 2017-12-28 (×5): qty 2

## 2017-12-28 MED ORDER — SODIUM CHLORIDE 0.9% FLUSH: 3.0000 mL | INTRAVENOUS | Status: DC | PRN

## 2017-12-28 MED ORDER — THROMBIN 20000 UNITS EX SOLR
CUTANEOUS | Status: DC | PRN
  Administered 2017-12-28: 20 mL

## 2017-12-28 MED ORDER — OXYCODONE-ACETAMINOPHEN 10-325 MG PO TABS: 1.0000 | ORAL_TABLET | Freq: Four times a day (QID) | ORAL | 0 refills | Status: DC | PRN

## 2017-12-28 MED ORDER — OXYCODONE HCL 5 MG PO TABS
10.0000 mg | ORAL_TABLET | ORAL | Status: DC | PRN
  Administered 2017-12-28 – 2018-01-01 (×27): 10 mg via ORAL
  Filled 2017-12-28 (×26): qty 2

## 2017-12-28 MED ORDER — ACETAMINOPHEN 650 MG RE SUPP: 650.0000 mg | RECTAL | Status: DC | PRN

## 2017-12-28 MED ORDER — OXYCODONE HCL 5 MG PO TABS: 5.0000 mg | ORAL_TABLET | ORAL | Status: DC | PRN

## 2017-12-28 MED ORDER — PREGABALIN 75 MG PO CAPS
75.0000 mg | ORAL_CAPSULE | Freq: Two times a day (BID) | ORAL | Status: DC
  Administered 2017-12-28 – 2017-12-29 (×3): 75 mg via ORAL
  Filled 2017-12-28 (×3): qty 1

## 2017-12-28 MED ORDER — BUPIVACAINE-EPINEPHRINE (PF) 0.25% -1:200000 IJ SOLN
INTRAMUSCULAR | Status: AC
  Filled 2017-12-28: qty 30

## 2017-12-28 MED ORDER — CEFAZOLIN SODIUM-DEXTROSE 2-4 GM/100ML-% IV SOLN
2.0000 g | Freq: Three times a day (TID) | INTRAVENOUS | Status: AC
  Administered 2017-12-28 – 2017-12-29 (×2): 2 g via INTRAVENOUS
  Filled 2017-12-28 (×2): qty 100

## 2017-12-28 MED ORDER — SODIUM CHLORIDE 0.9 % IJ SOLN
INTRAMUSCULAR | Status: AC
  Filled 2017-12-28: qty 10

## 2017-12-28 MED ORDER — HEMOSTATIC AGENTS (NO CHARGE) OPTIME
TOPICAL | Status: DC | PRN
  Administered 2017-12-28: 1

## 2017-12-28 MED ORDER — PHENOL 1.4 % MT LIQD: 1.0000 | OROMUCOSAL | Status: DC | PRN

## 2017-12-28 MED ORDER — DOCUSATE SODIUM 100 MG PO CAPS
100.0000 mg | ORAL_CAPSULE | Freq: Two times a day (BID) | ORAL | Status: DC
  Administered 2017-12-28 – 2017-12-31 (×7): 100 mg via ORAL
  Filled 2017-12-28 (×7): qty 1

## 2017-12-28 MED ORDER — PROPOFOL 10 MG/ML IV BOLUS
INTRAVENOUS | Status: DC | PRN
  Administered 2017-12-28: 130 mg via INTRAVENOUS

## 2017-12-28 MED ORDER — METHOCARBAMOL 500 MG PO TABS
ORAL_TABLET | ORAL | Status: AC
  Filled 2017-12-28: qty 1

## 2017-12-28 MED ORDER — LIDOCAINE 2% (20 MG/ML) 5 ML SYRINGE
INTRAMUSCULAR | Status: AC
  Filled 2017-12-28: qty 5

## 2017-12-28 MED ORDER — DEXAMETHASONE SODIUM PHOSPHATE 10 MG/ML IJ SOLN
INTRAMUSCULAR | Status: DC | PRN
  Administered 2017-12-28: 10 mg via INTRAVENOUS

## 2017-12-28 MED ORDER — ESCITALOPRAM OXALATE 10 MG PO TABS
20.0000 mg | ORAL_TABLET | Freq: Every day | ORAL | Status: DC
  Administered 2017-12-29 – 2018-01-04 (×6): 20 mg via ORAL
  Filled 2017-12-28: qty 2
  Filled 2017-12-28 (×3): qty 1
  Filled 2017-12-28 (×2): qty 2

## 2017-12-28 MED ORDER — ONDANSETRON HCL 4 MG PO TABS: 4.0000 mg | ORAL_TABLET | Freq: Four times a day (QID) | ORAL | Status: DC | PRN

## 2017-12-28 MED ORDER — LACTATED RINGERS IV SOLN
INTRAVENOUS | Status: DC
  Administered 2018-01-01: 18:00:00 via INTRAVENOUS

## 2017-12-28 MED ORDER — ALBUMIN HUMAN 5 % IV SOLN
INTRAVENOUS | Status: DC | PRN
  Administered 2017-12-28: 10:00:00 via INTRAVENOUS

## 2017-12-28 MED ORDER — BUPIVACAINE-EPINEPHRINE 0.25% -1:200000 IJ SOLN
INTRAMUSCULAR | Status: DC | PRN
  Administered 2017-12-28: 10 mL

## 2017-12-28 MED ORDER — 0.9 % SODIUM CHLORIDE (POUR BTL) OPTIME
TOPICAL | Status: DC | PRN
  Administered 2017-12-28 (×3): 1000 mL

## 2017-12-28 MED ORDER — SODIUM CHLORIDE 0.9% FLUSH
3.0000 mL | Freq: Two times a day (BID) | INTRAVENOUS | Status: DC
  Administered 2017-12-29 – 2018-01-03 (×11): 3 mL via INTRAVENOUS

## 2017-12-28 MED ORDER — SODIUM CHLORIDE 0.9 % IV SOLN: 250.0000 mL | INTRAVENOUS | Status: DC

## 2017-12-28 MED ORDER — METHOCARBAMOL 500 MG PO TABS: 500.0000 mg | ORAL_TABLET | Freq: Three times a day (TID) | ORAL | 0 refills | Status: DC

## 2017-12-28 MED ORDER — ONDANSETRON HCL 4 MG/2ML IJ SOLN
INTRAMUSCULAR | Status: DC | PRN
  Administered 2017-12-28: 4 mg via INTRAVENOUS

## 2017-12-28 MED ORDER — CEFAZOLIN SODIUM 1 G IJ SOLR
INTRAMUSCULAR | Status: AC
  Filled 2017-12-28: qty 20

## 2017-12-28 MED ORDER — POLYETHYLENE GLYCOL 3350 17 G PO PACK: 17.0000 g | PACK | Freq: Every day | ORAL | Status: DC | PRN

## 2017-12-28 MED ORDER — PHENYLEPHRINE HCL 10 MG/ML IJ SOLN
INTRAMUSCULAR | Status: AC
  Filled 2017-12-28: qty 1

## 2017-12-28 MED ORDER — CEFAZOLIN SODIUM-DEXTROSE 2-4 GM/100ML-% IV SOLN
2.0000 g | INTRAVENOUS | Status: AC
  Administered 2017-12-28 (×2): 2 g via INTRAVENOUS
  Filled 2017-12-28: qty 100

## 2017-12-28 MED ORDER — FENTANYL CITRATE (PF) 100 MCG/2ML IJ SOLN
INTRAMUSCULAR | Status: DC | PRN
  Administered 2017-12-28 (×4): 50 ug via INTRAVENOUS
  Administered 2017-12-28: 100 ug via INTRAVENOUS
  Administered 2017-12-28 (×6): 50 ug via INTRAVENOUS

## 2017-12-28 MED ORDER — MORPHINE SULFATE (PF) 2 MG/ML IV SOLN
1.0000 mg | INTRAVENOUS | Status: DC | PRN
  Administered 2017-12-28 – 2018-01-01 (×6): 1 mg via INTRAVENOUS
  Filled 2017-12-28 (×6): qty 1

## 2017-12-28 MED ORDER — MIDAZOLAM HCL 5 MG/5ML IJ SOLN
INTRAMUSCULAR | Status: DC | PRN
  Administered 2017-12-28: 2 mg via INTRAVENOUS

## 2017-12-28 MED ORDER — METHOCARBAMOL 500 MG PO TABS
500.0000 mg | ORAL_TABLET | Freq: Four times a day (QID) | ORAL | Status: DC | PRN
  Administered 2017-12-28 – 2018-01-01 (×13): 500 mg via ORAL
  Filled 2017-12-28 (×12): qty 1

## 2017-12-28 MED ORDER — MENTHOL 3 MG MT LOZG: 1.0000 | LOZENGE | OROMUCOSAL | Status: DC | PRN

## 2017-12-28 MED ORDER — METHOCARBAMOL 1000 MG/10ML IJ SOLN
500.0000 mg | Freq: Four times a day (QID) | INTRAVENOUS | Status: DC | PRN
  Administered 2017-12-28 – 2017-12-29 (×2): 500 mg via INTRAVENOUS
  Filled 2017-12-28 (×3): qty 5

## 2017-12-28 MED ORDER — SODIUM CHLORIDE 0.9 % IV SOLN
INTRAVENOUS | Status: DC | PRN
  Administered 2017-12-28: 11:00:00 via INTRAVENOUS
  Administered 2017-12-28: 25 ug/min via INTRAVENOUS

## 2017-12-28 SURGICAL SUPPLY — 74 items
AGENT HMST KT MTR STRL THRMB (HEMOSTASIS) ×1
BNDG GAUZE ELAST 4 BULKY (GAUZE/BANDAGES/DRESSINGS) ×2 IMPLANT
BUR EGG ELITE 4.0 (BURR) IMPLANT
BUR MATCHSTICK NEURO 3.0 LAGG (BURR) IMPLANT
CANISTER SUCT 3000ML PPV (MISCELLANEOUS) ×2 IMPLANT
CLSR STERI-STRIP ANTIMIC 1/2X4 (GAUZE/BANDAGES/DRESSINGS) ×2 IMPLANT
CORD BI POLAR (MISCELLANEOUS) ×2 IMPLANT
COVER SURGICAL LIGHT HANDLE (MISCELLANEOUS) ×3 IMPLANT
DRAIN CHANNEL 15F RND FF W/TCR (WOUND CARE) ×1 IMPLANT
DRAPE C-ARMOR (DRAPES) ×1 IMPLANT
DRAPE POUCH INSTRU U-SHP 10X18 (DRAPES) ×2 IMPLANT
DRAPE SURG 17X23 STRL (DRAPES) ×2 IMPLANT
DRAPE U-SHAPE 47X51 STRL (DRAPES) ×2 IMPLANT
DRSG OPSITE POSTOP 3X4 (GAUZE/BANDAGES/DRESSINGS) ×2 IMPLANT
DRSG OPSITE POSTOP 4X6 (GAUZE/BANDAGES/DRESSINGS) ×1 IMPLANT
DURAPREP 26ML APPLICATOR (WOUND CARE) ×2 IMPLANT
ELECT BLADE 4.0 EZ CLEAN MEGAD (MISCELLANEOUS)
ELECT CAUTERY BLADE 6.4 (BLADE) ×2 IMPLANT
ELECT PENCIL ROCKER SW 15FT (MISCELLANEOUS) ×2 IMPLANT
ELECT REM PT RETURN 9FT ADLT (ELECTROSURGICAL) ×2
ELECTRODE BLDE 4.0 EZ CLN MEGD (MISCELLANEOUS) IMPLANT
ELECTRODE REM PT RTRN 9FT ADLT (ELECTROSURGICAL) ×1 IMPLANT
EVACUATOR SILICONE 100CC (DRAIN) ×2 IMPLANT
GLOVE BIO SURGEON STRL SZ 6.5 (GLOVE) ×7 IMPLANT
GLOVE BIOGEL PI IND STRL 6.5 (GLOVE) ×1 IMPLANT
GLOVE BIOGEL PI IND STRL 8.5 (GLOVE) ×1 IMPLANT
GLOVE BIOGEL PI INDICATOR 6.5 (GLOVE) ×6
GLOVE BIOGEL PI INDICATOR 8.5 (GLOVE) ×2
GLOVE ECLIPSE 7.0 STRL STRAW (GLOVE) ×2 IMPLANT
GLOVE SS BIOGEL STRL SZ 8.5 (GLOVE) ×2 IMPLANT
GLOVE SUPERSENSE BIOGEL SZ 8.5 (GLOVE) ×2
GOWN STRL REUS W/ TWL LRG LVL3 (GOWN DISPOSABLE) ×6 IMPLANT
GOWN STRL REUS W/TWL 2XL LVL3 (GOWN DISPOSABLE) ×3 IMPLANT
GOWN STRL REUS W/TWL LRG LVL3 (GOWN DISPOSABLE) ×12
GUIDEWIRE NITINOL BEVEL TIP (WIRE) ×12 IMPLANT
KIT BASIN OR (CUSTOM PROCEDURE TRAY) ×2 IMPLANT
KIT TURNOVER KIT B (KITS) ×2 IMPLANT
NDL I-PASS III (NEEDLE) IMPLANT
NDL SPNL 18GX3.5 QUINCKE PK (NEEDLE) ×2 IMPLANT
NEEDLE 22X1 1/2 (OR ONLY) (NEEDLE) ×2 IMPLANT
NEEDLE I-PASS III (NEEDLE) ×2 IMPLANT
NEEDLE SPNL 18GX3.5 QUINCKE PK (NEEDLE) ×4 IMPLANT
NS IRRIG 1000ML POUR BTL (IV SOLUTION) ×4 IMPLANT
PACK LAMINECTOMY ORTHO (CUSTOM PROCEDURE TRAY) ×2 IMPLANT
PACK UNIVERSAL I (CUSTOM PROCEDURE TRAY) ×2 IMPLANT
PAD ARMBOARD 7.5X6 YLW CONV (MISCELLANEOUS) ×4 IMPLANT
PATTIES SURGICAL .5 X.5 (GAUZE/BANDAGES/DRESSINGS) ×3 IMPLANT
PATTIES SURGICAL .5 X1 (DISPOSABLE) ×5 IMPLANT
ROD RELINE MAS 5.5X55MM LORD (Rod) ×1 IMPLANT
ROD RELINE MAS LORDOTIC 5.5X60 (Rod) ×2 IMPLANT
SCREW LOCK RELINE 5.5 TULIP (Screw) ×5 IMPLANT
SCREW RED MAS POLY 7.5X40MM (Screw) ×3 IMPLANT
SCREW RELINE RED 7.5X35MM (Screw) ×4 IMPLANT
SPONGE LAP 4X18 RFD (DISPOSABLE) ×6 IMPLANT
SPONGE SURGIFOAM ABS GEL 100 (HEMOSTASIS) ×1 IMPLANT
SURGIFLO W/THROMBIN 8M KIT (HEMOSTASIS) ×1 IMPLANT
SUT BONE WAX W31G (SUTURE) ×2 IMPLANT
SUT MON AB 3-0 SH 27 (SUTURE) ×2
SUT MON AB 3-0 SH27 (SUTURE) ×1 IMPLANT
SUT VIC AB 0 CT1 27 (SUTURE)
SUT VIC AB 0 CT1 27XBRD ANBCTR (SUTURE) IMPLANT
SUT VIC AB 1 CT1 18XCR BRD 8 (SUTURE) ×1 IMPLANT
SUT VIC AB 1 CT1 27 (SUTURE) ×2
SUT VIC AB 1 CT1 27XBRD ANBCTR (SUTURE) ×1 IMPLANT
SUT VIC AB 1 CT1 8-18 (SUTURE) ×2
SUT VIC AB 2-0 CT1 18 (SUTURE) ×2 IMPLANT
SUT VIC AB 2-0 CT1 27 (SUTURE) ×2
SUT VIC AB 2-0 CT1 TAPERPNT 27 (SUTURE) IMPLANT
SYR BULB IRRIGATION 50ML (SYRINGE) ×2 IMPLANT
SYR CONTROL 10ML LL (SYRINGE) ×2 IMPLANT
TOWEL GREEN STERILE (TOWEL DISPOSABLE) ×2 IMPLANT
TOWEL GREEN STERILE FF (TOWEL DISPOSABLE) ×2 IMPLANT
TRAY FOLEY MTR SLVR 16FR STAT (SET/KITS/TRAYS/PACK) ×1 IMPLANT
YANKAUER SUCT BULB TIP NO VENT (SUCTIONS) ×2 IMPLANT

## 2017-12-28 NOTE — Brief Op Note (Signed)
12/28/2017  3:38 PM  PATIENT:  Ariel Morgan  42 y.o. female  PRE-OPERATIVE DIAGNOSIS:  Grade 3 isthesis slip with degenerative disc disease L5-1  POST-OPERATIVE DIAGNOSIS:  Grade 3 isthesis slip with degenerative disc disease L5-1  PROCEDURE:  Procedure(s) with comments: L4-S1 decompression and fusion (N/A) - 4.5 hrs  SURGEON:  Surgeon(s) and Role:    Melina Schools, MD - Primary  PHYSICIAN ASSISTANT:   ASSISTANTS: Carmen Mayo   ANESTHESIA:   general  EBL:  500 mL   BLOOD ADMINISTERED:none  DRAINS: none   LOCAL MEDICATIONS USED:  MARCAINE     SPECIMEN:  No Specimen  DISPOSITION OF SPECIMEN:  N/A  COUNTS:  YES  TOURNIQUET:  * No tourniquets in log *  DICTATION: .Dragon Dictation  PLAN OF CARE: Admit to inpatient   PATIENT DISPOSITION:  PACU - hemodynamically stable.

## 2017-12-28 NOTE — Op Note (Signed)
Operative note  Preoperative diagnosis: L5-S1 grade 3 isthmic spondylolisthesis with significant foraminal stenosis and L5 nerve compression  Postoperative diagnosis: Same  Operative procedure:  1. L4-S1 Gill decompression    2.  Posterior instrumented fusion L4-S1    3.  Posterior lateral arthrodesis P3-X9  Complications: Medial disruption of the L5 pedicle on the left side.  Ongoing significant compression of the thecal sac was noted after the L5 decompression.  As result of these 2 factors I have elected to extend the fusion/decompression to the L4 level.  This was discussed with the patient preoperatively.  The patient and her husband were aware that I would need to extend the fusion and decompression to the L4 level.  I did notify the husband during the case that we are extending the fusion and decompression.  First assistant: Ronette Deter  Implants:Nuvasive pedicle screw system.  L4: 7.5 x 40 mm length pedicle screws bilaterally.  Right L5: 7.5x40 mm.  S1: 7.5 x 35 mm length bilaterally.  Graft: autograft  Indications: This is a very pleasant young lady who is been having progressive debilitating back buttock and bilateral neuropathic leg pain.  Imaging studies demonstrate a grade 3 isthmic spondylolisthesis with marked foraminal stenosis.  Because of the failure of conservative management to improve her quality of life we elected to proceed with surgery.  Initial thought was an L5-S1 decompression and instrumented fusion.  However I did inform the patient that there might be ongoing residual stenosis that would require extending the decompression superiorly, and/or technical difficulties which would necessitate extending the fusion to L4.  Both her and her husband expressed an understanding of this and elected to proceed with surgery.  All appropriate risks benefits and alternatives of surgery were discussed and consent was obtained.  Operative report: Patient was brought the operating room  placed upon the operating room table.  After successful induction of general anesthesia and endotracheal intubation teds SCDs and a Foley were inserted.  Patient was turned prone onto the axis table and all bony prominences were well-padded.  The back was then prepped and draped in a standard fashion.  Timeout was taken to confirm patient procedure and all other important data.  The table was then jackknifed to place the patient into kyphosis to facilitate the decompression.  The back was then prepped and draped in a standard fashion and a timeout was taken to confirm patient procedure and all other important data.  X-ray was then used to identify the L4 and S1 pedicles and I marked out my midline incision and infiltrated with quarter percent Marcaine.  Midline incision was made and sharp dissection was carried out down to the deep fascia.  The deep fascia was sharply incised and I stripped the paraspinal muscles to expose the S1, L5, and L4 spinous process and lamina.  Once I had the L3-4 and L5-S1 facet complex identified I traveled lateral to that to continue and exposed the posterior lateral gutter.  At this point I could now palpate and visualize the deeply seated L4-5 facet complex.  At this point fluoroscopy views were used to confirm the L4, L5, and S1 pedicles.  Once I had identified each of the pedicles I then proceeded with the decompression.  The spinous process of L5 was removed in its entirety as was that of L4 bone graft.  I then remove the entire posterior lamina of L5 using Kerrison rongeur pituitary rongeurs and neuro curettes.  With the central decompression complete I then noted  that there was still compression of the thecal sac centrally at L4.  The thecal sac itself it started to expand in the new space.  Because of this at length to extend my decompression.  I then used neuro patties develop a plane between the lamina of L4 and the thecal sac using the Kerrison rongeurs I performed a  complete laminectomy of L4.  At this point I had excellent decompression of the thecal sac from L4 down to S1.  I could now travel into the lateral recess and completely decompress the lateral recess and exposed decompress the L4 the L5 and S1 nerve roots.  The L5 nerve roots were significantly compressed.  There was marked compression under the pars.  The L5 pars was taken down as was the flank complex.  This was a standard Gill decompression.  At this point I had completely decompressed L4-5 and L5-S1.  I could visualize the L5 nerve root.  I could also visualize the L4 and the S1 nerve roots and they were adequately decompressed.  At this point with the thecal sac and nerve roots decompressed I proceeded with the fusion.  On the left-hand side I began placing my pedicle screw at L5.  At this point I direct visualization of the medial inferior and superior aspects of the pedicle of L5 and so using my anatomical landmarks I advanced the Jamshidi needle into the L5 pedicle.  I did confirm trajectory with fluoroscopic guidance.  Once the Jamshidi needle was properly positioned into the vertebral body I checked the boundaries of the pedicle to ensure that there was no breach.  I then placed a guidepin and then placed my 6.5 diameter tap.  It was during this tapping procedure that the medial wall fracture.  At this point I elected to abandon the left L5 pedicle.  The fracture fragments were removed further decompressing the left L5 nerve root.  And this placed on the left side I placed under direct visualization the L4 Jamshidi needle.  This was advanced through the L4 pedicle and into the vertebral body and I performed a similar at S1.  I then tapped and then sounded both pedicle holes with a ball-tipped feeler confirming at solid bony canal.  The appropriate size pedicle screws were placed at L4 and S1 without complication.  On the contralateral side I was able to place the L5 screw on without reaching the pedicle.   I then placed the L4 and S1 in standard fashion.  At this point with L5 pedicle screws in place I then directly visualized all the pedicle screws using a Penfield 4 to palpate the medial and inferior borders to confirm that there was no breach.  X-rays were also taken which were satisfactory.  At this point contoured rods were then placed and secured into position.  I did get some reduction of the L5-S1 spondylolisthesis and improvement of the foraminal decompression.  Prior to locking the rods in place I did restore the patient into a lordotic position using the Axis bed.  This improved the overall lumbar lordosis.  With both rods secured I torqued off the locking knots according manufacturer standards.  In the posterior lateral gutter I placed the autograft that harvested from the decompression.  At this point I irrigated the wound copiously normal saline and made sure there was no bone fragments in the foramen causing compression of the nerve and I had hemostasis using bipolar cautery and FloSeal.  At this point after final irrigation  the wound was dry and there was no bleeding and so I closed the deep fascia with interrupted #1 Vicryl suture.  Superficial with 2-0 Vicryl suture and 3-0 Monocryl for the skin.  Stay strips and a dry dressing were applied the patient was ultimately extubated transfer the PACU without incident.  The end of the case all needle sponge counts were correct.  There were no adverse intraoperative events other than what has been mentioned.

## 2017-12-28 NOTE — Transfer of Care (Signed)
Immediate Anesthesia Transfer of Care Note  Patient: Ariel Morgan  Procedure(s) Performed: L4-S1 decompression and fusion (N/A Spine Lumbar)  Patient Location: PACU  Anesthesia Type:General  Level of Consciousness: awake and alert   Airway & Oxygen Therapy: Patient Spontanous Breathing and Patient connected to nasal cannula oxygen  Post-op Assessment: Report given to RN and Post -op Vital signs reviewed and stable  Post vital signs: Reviewed and stable  Last Vitals:  Vitals Value Taken Time  BP 127/67 12/28/2017  3:41 PM  Temp    Pulse 125 12/28/2017  3:41 PM  Resp 9 12/28/2017  3:41 PM  SpO2 99 % 12/28/2017  3:41 PM  Vitals shown include unvalidated device data.  Last Pain:  Vitals:   12/28/17 0602  TempSrc:   PainSc: 7       Patients Stated Pain Goal: 3 (35/00/93 8182)  Complications: No apparent anesthesia complications

## 2017-12-28 NOTE — Anesthesia Procedure Notes (Signed)
Procedure Name: Intubation Date/Time: 12/28/2017 7:49 AM Performed by: Kyung Rudd, CRNA Pre-anesthesia Checklist: Patient identified, Emergency Drugs available, Suction available, Patient being monitored and Timeout performed Patient Re-evaluated:Patient Re-evaluated prior to induction Oxygen Delivery Method: Circle system utilized Preoxygenation: Pre-oxygenation with 100% oxygen Induction Type: IV induction Ventilation: Mask ventilation without difficulty Laryngoscope Size: Mac and 3 Grade View: Grade I Tube type: Oral Tube size: 7.0 mm Number of attempts: 1 Airway Equipment and Method: Stylet Placement Confirmation: ETT inserted through vocal cords under direct vision,  positive ETCO2 and breath sounds checked- equal and bilateral Secured at: 20 cm Tube secured with: Tape Dental Injury: Teeth and Oropharynx as per pre-operative assessment

## 2017-12-28 NOTE — Discharge Instructions (Signed)
Spinal Fusion, Care After °These instructions give you information about caring for yourself after your procedure. Your doctor may also give you more specific instructions. Call your doctor if you have any problems or questions after your procedure. °Follow these instructions at home: °Medicines °· Take over-the-counter and prescription medicines only as told by your doctor. These include any medicines for pain. °· Do not drive for 24 hours if you received a sedative. °· Do not drive or use heavy machinery while taking prescription pain medicine. °· If you were prescribed an antibiotic medicine, take it as told by your doctor. Do not stop taking the antibiotic even if you start to feel better. °Surgical Cut (Incision) Care °· Follow instructions from your doctor about how to take care of your surgical cut. Make sure you: °? Wash your hands with soap and water before you change your bandage (dressing). If you cannot use soap and water, use hand sanitizer. °? Change your bandage as told by your doctor. °? Leave stitches (sutures), skin glue, or skin tape (adhesive) strips in place. They may need to stay in place for 2 weeks or longer. If tape strips get loose and curl up, you may trim the loose edges. Do not remove tape strips completely unless your doctor says it is okay. °· Keep your surgical cut clean and dry. Do not take baths, swim, or use a hot tub until your doctor says it is okay. °· Check your surgical cut and the area around it every day for: °? Redness. °? Swelling. °? Fluid. °Physical Activity °· Return to your normal activities as told by your doctor. Ask your doctor what activities are safe for you. Rest and protect your back as much as you can. °· Follow instructions from your doctor about how to move. Use good posture to help your spine heal. °· Do not lift anything that is heavier than 8 lb (3.6 kg) or as told by your doctor until he or she says that it is safe. Do not lift anything over your  head. °· Do not twist or bend at the waist until your doctor says it is okay. °· Avoid pushing or pulling motions. °· Do not sit or lie down in the same position for long periods of time. °· Do not start to exercise until your doctor says it is okay. Ask your doctor what kinds of exercise you can do to make your back stronger. °General instructions °· If you were given a brace, use it as told by your doctor. °· Wear compression stockings as told by your doctor. °· Do not use tobacco products. These include cigarettes, chewing tobacco, or e-cigarettes. If you need help quitting, ask your doctor. °· Keep all follow-up visits as told by your doctor. This is important. This includes any visits with your physical therapist, if this applies. °Contact a doctor if: °· Your pain gets worse. °· Your medicine does not help your pain. °· Your legs or feet become painful or swollen. °· Your surgical cut is red, swollen, or painful. °· You have fluid, blood, or pus coming from your surgical cut. °· You feel sick to your stomach (nauseous). °· You throw up (vomit). °· Your have weakness or loss of feeling (numbness) in your legs that is new or getting worse. °· You have a fever. °· You have trouble controlling when you pee (urinate) or poop (have a bowel movement). °Get help right away if: °· Your pain is very bad. °· You have   chest pain. °· You have trouble breathing. °· You start to have a cough. °These symptoms may be an emergency. Do not wait to see if the symptoms will go away. Get medical help right away. Call your local emergency services (911 in the [place] Do not drive yourself to the hospital. °This information is not intended to replace advice given to you by your health care provider. Make sure you discuss any questions you have with your health care provider. °Document Released: 07/16/2010 Document Revised: 11/18/2015 Document Reviewed: 09/04/2014 °Elsevier Interactive Patient Education © 2018 [employer] ° °

## 2017-12-29 ENCOUNTER — Inpatient Hospital Stay (HOSPITAL_COMMUNITY): Payer: BC Managed Care – PPO

## 2017-12-29 ENCOUNTER — Encounter (HOSPITAL_COMMUNITY): Payer: Self-pay | Admitting: Orthopedic Surgery

## 2017-12-29 MED ORDER — DEXAMETHASONE SODIUM PHOSPHATE 4 MG/ML IJ SOLN
6.0000 mg | Freq: Four times a day (QID) | INTRAMUSCULAR | Status: AC
  Administered 2017-12-29 – 2017-12-30 (×4): 6 mg via INTRAVENOUS
  Filled 2017-12-29 (×4): qty 2

## 2017-12-29 MED FILL — Thrombin (Recombinant) For Soln 20000 Unit: CUTANEOUS | Qty: 1 | Status: AC

## 2017-12-29 NOTE — Progress Notes (Signed)
    Subjective: Procedure(s) (LRB): L4-S1 decompression and fusion (N/A) 1 Day Post-Op  Patient reports pain as 8 on 0-10 scale.  Reports increased right radicular leg pain reports incisional back pain   Positive void Negative bowel movement Positive flatus Negative chest pain or shortness of breath  Objective: Vital signs in last 24 hours: Temp:  [97.2 F (36.2 C)-98.4 F (36.9 C)] 98.4 F (36.9 C) (09/26 0408) Pulse Rate:  [91-130] 91 (09/26 0408) Resp:  [9-21] 18 (09/26 0408) BP: (107-150)/(62-122) 107/64 (09/26 0408) SpO2:  [92 %-100 %] 96 % (09/26 0408)  Intake/Output from previous day: 09/25 0701 - 09/26 0700 In: 2626.4 [I.V.:2100; IV Piggyback:526.4] Out: 2080 [Urine:1580; Blood:500]  Labs: No results for input(s): WBC, RBC, HCT, PLT in the last 72 hours. No results for input(s): NA, K, CL, CO2, BUN, CREATININE, GLUCOSE, CALCIUM in the last 72 hours. No results for input(s): LABPT, INR in the last 72 hours.  Physical Exam: ABD soft Intact pulses distally Incision: dressing C/D/I and no drainage Compartment soft right leg: postive st leg raise.  4/5 EHL/TA strength.  5/5 GA strength.  Sensation to LT in L5 dermatome is decreased There is no height or weight on file to calculate BMI.   Assessment/Plan: Patient stable  xrays n/a Continue mobilization with physical therapy Continue care  Advance diet Up with therapy  1. Right L5 nerve irritation is noted.  Now with new weakness.  May be secondary to manipulation during surgery or neuropraxia.  There was no evidence of direct neural injury at time of surgery. Plan: IV steroid x 24 hrs and monitor.  If no improvement will order CT scan to re-check screw position.   2. Continue lyrica   Melina Schools, MD [facility] [phone]

## 2017-12-29 NOTE — Progress Notes (Signed)
Physical Therapy Evaluation Patient Details Name: Ariel Morgan MRN: [id number] DOB: 29-Aug-1975 Today's Date: 12/29/2017   History of Present Illness  Pt is a 42 year old female s/p L4-S1 decompression and fusion. Pt has a PMH including Anemia, Anxiety, Arthritis, Frequent headaches, GERD, Hypercholesterolemia, and Placenta previa.  Clinical Impression  Patient is s/p above surgery resulting in deficits listed below (see PT Problem List). 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 At the time of the eval pt reported increased radicular RLE pain and weakness lifting R foot with gait, which were not present before surgery. Pt performed ambulation with min g with RW for safety. Pt educated on positioning and brace adjustments. Recommending RW in order to improve safety with gait secondary to decreased dorsiflexion in R foot. Pt will need to negotiate stairs before d/c.  Acutely, patient will benefit from skilled PT to increase their independence and safety with mobility to allow discharge to the venue listed below.       Follow Up Recommendations Supervision for mobility/OOB;No PT follow up    Equipment Recommendations  Rolling walker with 5" wheels    Recommendations for Other Services       Precautions / Restrictions Precautions Precautions: Back Precaution Booklet Issued: Yes (comment) Precaution Comments: Reviewed precautions in full with pt Required Braces or Orthoses: Spinal Brace Spinal Brace: Lumbar corset;Applied in sitting position Restrictions Weight Bearing Restrictions: No      Mobility  Bed Mobility Overal bed mobility: Needs Assistance Bed Mobility: Rolling;Sidelying to Sit Rolling: Supervision Sidelying to sit: Min guard       General bed mobility comments: Min G secondary to increase pain in RLE. Able to lower RLE without need for assist. Increased time and effort noted Donned corset in sitting.   Transfers Overall  transfer level: Needs assistance Equipment used: Rolling walker (2 wheeled) Transfers: Sit to/from Stand Sit to Stand: Min assist;Min guard         General transfer comment: Min A intitially for steadying upon standing progressing to min G. Cues required for safe hand placement as pt has never used a RW prior to today. Pt able to ascend from lower bed height than hers at home.   Ambulation/Gait Ambulation/Gait assistance: Min guard Gait Distance (Feet): 150 Feet Assistive device: Rolling walker (2 wheeled) Gait Pattern/deviations: Step-through pattern;Decreased step length - right;Decreased dorsiflexion - right;Decreased stance time - right;Decreased weight shift to right;Antalgic Gait velocity: decreased Gait velocity interpretation: <1.31 ft/sec, indicative of household ambulator General Gait Details: Min cues for heel strike to toe off on RLE, but pt unable to dorsiflex right foot. Pt demonstrating increased external rotation on R foot compared to the left in order to advance foot forward, but no foot drop noted. Cues for proximity to RW and upright posture to maintain spinal precautions.    Stairs            Wheelchair Mobility    Modified Rankin (Stroke Patients Only)       Balance Overall balance assessment: Needs assistance Sitting-balance support: Feet supported;No upper extremity supported Sitting balance-Leahy Scale: Good     Standing balance support: Bilateral upper extremity supported;During functional activity Standing balance-Leahy Scale: Poor Standing balance comment: Pt reliant on Bil UE support for dynamic activites. Able to stand statically without UE support.  Pertinent Vitals/Pain Pain Assessment: 0-10 Pain Score: 7  Pain Location: Incision and RLE Pain Descriptors / Indicators: Aching;Grimacing;Guarding Pain Intervention(s): Limited activity within patient's tolerance;Monitored during session;Repositioned     Home Living Family/patient expects to be discharged to:: Private residence Living Arrangements: Spouse/significant other;Children Available Help at Discharge: Family;Available 24 hours/day Type of Home: House Home Access: Stairs to enter Entrance Stairs-Rails: None Entrance Stairs-Number of Steps: 3 Home Layout: Two level;Able to live on main level with bedroom/bathroom;Full bath on main level Home Equipment: None      Prior Function Level of Independence: Independent         Comments: Was working as a Education officer, museum prior to surgery.     Hand Dominance        Extremity/Trunk Assessment   Upper Extremity Assessment Upper Extremity Assessment: Defer to OT evaluation    Lower Extremity Assessment Lower Extremity Assessment: RLE deficits/detail RLE Deficits / Details: Pt reports her RLE pain has worsened since the surgery. She is unable to dorsiflex foot past neutral.  Denies numbness.     Cervical / Trunk Assessment Cervical / Trunk Assessment: Other exceptions Cervical / Trunk Exceptions: s/p lumbar surgery  Communication   Communication: No difficulties  Cognition Arousal/Alertness: Awake/alert Behavior During Therapy: WFL for tasks assessed/performed Overall Cognitive Status: Within Functional Limits for tasks assessed                                        General Comments      Exercises     Assessment/Plan    PT Assessment Patient needs continued PT services  PT Problem List Decreased strength;Decreased range of motion;Decreased activity tolerance;Decreased balance;Decreased mobility;Decreased coordination;Decreased knowledge of use of DME;Decreased knowledge of precautions;Pain       PT Treatment Interventions DME instruction;Gait training;Stair training;Functional mobility training;Therapeutic activities;Therapeutic exercise;Balance training;Neuromuscular re-education;Patient/family education;Manual techniques;Modalities    PT  Goals (Current goals can be found in the Care Plan section)  Acute Rehab PT Goals Patient Stated Goal: "improve R foot drop" PT Goal Formulation: With patient Time For Goal Achievement: 01/05/18 Potential to Achieve Goals: Fair    Frequency Min 5X/week   Barriers to discharge        Co-evaluation               AM-PAC PT "6 Clicks" Daily Activity  Outcome Measure Difficulty turning over in bed (including adjusting bedclothes, sheets and blankets)?: A Little Difficulty moving from lying on back to sitting on the side of the bed? : A Little Difficulty sitting down on and standing up from a chair with arms (e.g., wheelchair, bedside commode, etc,.)?: Unable Help needed moving to and from a bed to chair (including a wheelchair)?: A Little Help needed walking in hospital room?: A Little Help needed climbing 3-5 steps with a railing? : A Lot 6 Click Score: 15    End of Session Equipment Utilized During Treatment: Gait belt;Back brace Activity Tolerance: Patient limited by fatigue;Patient limited by pain Patient left: in chair;with call bell/phone within reach Nurse Communication: Mobility status PT Visit Diagnosis: Other abnormalities of gait and mobility (R26.89);Muscle weakness (generalized) (M62.81);Difficulty in walking, not elsewhere classified (R26.2);Pain;Unsteadiness on feet (R26.81) Pain - Right/Left: Right Pain - part of body: Leg(& incision on back)    Time: 4132-4401 PT Time Calculation (min) (ACUTE ONLY): 21 min   Charges:    1 Mod Eval  Einar Crow, SPT  Student Physical Therapist Acute Rehab [phone]   Einar Crow 12/29/2017, 9:55 AM

## 2017-12-29 NOTE — Anesthesia Postprocedure Evaluation (Signed)
Anesthesia Post Note  Patient: Ariel Morgan  Procedure(s) Performed: L4-S1 decompression and fusion (N/A Spine Lumbar)     Patient location during evaluation: PACU Anesthesia Type: General Level of consciousness: awake and alert Pain management: pain level controlled Vital Signs Assessment: post-procedure vital signs reviewed and stable Respiratory status: spontaneous breathing, nonlabored ventilation, respiratory function stable and patient connected to nasal cannula oxygen Cardiovascular status: blood pressure returned to baseline and stable Postop Assessment: no apparent nausea or vomiting Anesthetic complications: no    Last Vitals:  Vitals:   12/29/17 0408 12/29/17 0825  BP: 107/64 123/63  Pulse: 91 93  Resp: 18 16  Temp: 36.9 C 36.9 C  SpO2: 96% 97%    Last Pain:  Vitals:   12/29/17 0825  TempSrc: Oral  PainSc:                  Lidia Collum

## 2017-12-29 NOTE — Evaluation (Signed)
Occupational Therapy Evaluation Patient Details Name: Ariel Morgan MRN: [id number] DOB: 29-Oct-1975 Today's Date: 12/29/2017    History of Present Illness Pt is a 42 year old female s/p L4-S1 decompression and fusion. Pt has a PMH including Anemia, Anxiety, Arthritis, Frequent headaches, GERD, Hypercholesterolemia, and Placenta previa.   Clinical Impression   PTA Pt independent in ADL and mobility. Pt is currently mod A for LB ADL, min guard assist for transfers and in room mobility with RW. Back handout provided and reviewed adls in detail. Pt educated on: clothing between brace, never sleep in brace, set an alarm at night for medication, avoid sitting for long periods of time, correct bed positioning for sleeping, correct sequence for bed mobility, avoiding lifting more than 5 pounds and never wash directly over incision. Pt will require continued skilled OT in the acute setting to maximize safety and independence in ADL and transfers. Pt is concerned about new R foot drop- if this persists, please bring elastic next session. Next session also bring in Marissa.     Follow Up Recommendations  No OT follow up;Supervision - Intermittent(watch for progress - anticipate no OT follow up)    Equipment Recommendations  3 in 1 bedside commode;Other (comment)(AE kit)    Recommendations for Other Services       Precautions / Restrictions Precautions Precautions: Back Precaution Booklet Issued: Yes (comment) Precaution Comments: Reviewed precautions in full with pt Required Braces or Orthoses: Spinal Brace Spinal Brace: Lumbar corset;Applied in sitting position Restrictions Weight Bearing Restrictions: No      Mobility Bed Mobility Overal bed mobility: Needs Assistance Bed Mobility: Rolling;Sidelying to Sit;Sit to Sidelying Rolling: Supervision Sidelying to sit: Min guard     Sit to sidelying: Min assist;HOB elevated(to assist BLE back into bed) General bed mobility comments:  good recall of log roll technique from PT session earlier  Transfers Overall transfer level: Needs assistance Equipment used: Rolling walker (2 wheeled) Transfers: Sit to/from Stand Sit to Stand: Min assist;Min guard         General transfer comment: vc for safe hand placement, min guard assist for initial rise    Balance Overall balance assessment: Needs assistance Sitting-balance support: Feet supported;No upper extremity supported Sitting balance-Leahy Scale: Good     Standing balance support: Bilateral upper extremity supported;During functional activity Standing balance-Leahy Scale: Poor Standing balance comment: Pt reliant on Bil UE support for dynamic activites. Able to stand statically without UE support.                             ADL either performed or assessed with clinical judgement   ADL Overall ADL's : Needs assistance/impaired Eating/Feeding: Independent   Grooming: Min guard;Standing Grooming Details (indicate cue type and reason): edcuated on compensatory strategies to maintain back precautions (referenced handout) Upper Body Bathing: Sitting;Set up;With adaptive equipment   Lower Body Bathing: Moderate assistance;With caregiver independent assisting;Sitting/lateral leans   Upper Body Dressing : Min guard;Minimal assistance;Sitting Upper Body Dressing Details (indicate cue type and reason): ton don brace sitting EOB Lower Body Dressing: Moderate assistance;Sit to/from stand Lower Body Dressing Details (indicate cue type and reason): Pt unable to son socks without at least mod A at this time Toilet Transfer: Min guard;Minimal assistance;Ambulation;RW Toilet Transfer Details (indicate cue type and reason): BSC over toilet, min guard Assist for initial boost Toileting- Clothing Manipulation and Hygiene: Maximal assistance;Sit to/from stand Toileting - Clothing Manipulation Details (indicate cue type and reason): initiated  education for rear peri  care Tub/ Shower Transfer: Walk-in shower;Min guard;Ambulation;Rolling walker;Shower seat   Functional mobility during ADLs: Min guard;Rolling walker(R foot drop) General ADL Comments: verbally educated in AE kit - will require demo next session; husband is helpful but works a Building control surveyor?: No apparent visual deficits     Environmental education officer      Pertinent Vitals/Pain Pain Assessment: 0-10 Pain Score: 8  Pain Location: Incision and RLE Pain Descriptors / Indicators: Aching;Grimacing;Guarding;Radiating Pain Intervention(s): Limited activity within patient's tolerance;Monitored during session;Repositioned     Hand Dominance Right   Extremity/Trunk Assessment Upper Extremity Assessment Upper Extremity Assessment: Overall WFL for tasks assessed   Lower Extremity Assessment Lower Extremity Assessment: RLE deficits/detail RLE Deficits / Details: Pt reports her RLE pain has worsened since the surgery. She is unable to dorsiflex foot past neutral.  Denies numbness.  RLE Sensation: WNL RLE Coordination: decreased fine motor;decreased gross motor   Cervical / Trunk Assessment Cervical / Trunk Assessment: Other exceptions Cervical / Trunk Exceptions: s/p lumbar surgery   Communication Communication Communication: No difficulties   Cognition Arousal/Alertness: Awake/alert Behavior During Therapy: WFL for tasks assessed/performed Overall Cognitive Status: Within Functional Limits for tasks assessed                                     General Comments  Pt's husband present through session and edcuation    Exercises     Shoulder Instructions      Home Living Family/patient expects to be discharged to:: Private residence Living Arrangements: Spouse/significant other;Children Available Help at Discharge: Family;Available 24 hours/day Type of Home: House Home Access: Stairs to enter [employer] of Steps: 3 Entrance  Stairs-Rails: None Home Layout: Two level;Able to live on main level with bedroom/bathroom;Full bath on main level     Bathroom Shower/Tub: Occupational psychologist: Standard     Home Equipment: None          Prior Functioning/Environment Level of Independence: Independent        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        OT Problem List: Decreased range of motion;Decreased strength;Decreased activity tolerance;Impaired balance (sitting and/or standing);Decreased knowledge of use of DME or AE;Decreased knowledge of precautions;Pain      OT Treatment/Interventions: Self-care/ADL training;DME and/or AE instruction;Therapeutic activities;Patient/family education;Balance training    OT Goals(Current goals can be found in the care plan section) Acute Rehab OT Goals Patient Stated Goal: "improve R foot drop" OT Goal Formulation: With patient Time For Goal Achievement: 01/12/18 Potential to Achieve Goals: Good ADL Goals Pt Will Perform Grooming: with modified independence;standing Pt Will Perform Lower Body Bathing: with modified independence;sit to/from stand;with adaptive equipment Pt Will Perform Upper Body Dressing: with modified independence;sitting Pt Will Perform Lower Body Dressing: with modified independence;with adaptive equipment;sit to/from stand Pt Will Transfer to Toilet: with modified independence;ambulating Pt Will Perform Toileting - Clothing Manipulation and hygiene: with modified independence;with adaptive equipment;sit to/from stand Additional ADL Goal #1: Pt will perform bed mobility as precursor to participating in ADL activity at mod I level  OT Frequency: Min 3X/week   Barriers to D/C:            Co-evaluation              AM-PAC PT "6 Clicks" Daily Activity  Outcome Measure Help from another person eating meals?: None Help from another person taking care of personal grooming?: A Little Help from another  person toileting, which includes using toliet, bedpan, or urinal?: A Lot Help from another person bathing (including washing, rinsing, drying)?: A Lot Help from another person to put on and taking off regular upper body clothing?: A Little Help from another person to put on and taking off regular lower body clothing?: A Lot 6 Click Score: 16   End of Session Equipment Utilized During Treatment: Back brace;Rolling walker Nurse Communication: Mobility status  Activity Tolerance: Patient limited by pain Patient left: in bed;with call bell/phone within reach;with family/visitor present  OT Visit Diagnosis: Unsteadiness on feet (R26.81);Other abnormalities of gait and mobility (R26.89);Pain Pain - Right/Left: Right Pain - part of body: Leg(back)                Time: 5456-2563 OT Time Calculation (min): 23 min Charges:  OT General Charges $OT Visit: 1 Visit OT Evaluation $OT Eval Moderate Complexity: 1 Mod OT Treatments $Self Care/Home Management : 8-22 mins  Hulda Humphrey OTR/L Acute Rehabilitation Services Pager: [phone] Office: [place] 12/29/2017, 12:44 PM

## 2017-12-30 MED ORDER — MAGNESIUM CITRATE PO SOLN
1.0000 | Freq: Once | ORAL | Status: AC
  Administered 2017-12-30: 1 via ORAL
  Filled 2017-12-30: qty 296

## 2017-12-30 MED ORDER — ALUM & MAG HYDROXIDE-SIMETH 200-200-20 MG/5ML PO SUSP
30.0000 mL | ORAL | Status: DC | PRN
  Administered 2017-12-30 – 2017-12-31 (×2): 30 mL via ORAL
  Filled 2017-12-30: qty 30

## 2017-12-30 MED ORDER — PREGABALIN 100 MG PO CAPS
100.0000 mg | ORAL_CAPSULE | Freq: Two times a day (BID) | ORAL | Status: DC
  Administered 2017-12-30 – 2018-01-01 (×5): 100 mg via ORAL
  Filled 2017-12-30: qty 1
  Filled 2017-12-30 (×2): qty 2
  Filled 2017-12-30: qty 1
  Filled 2017-12-30: qty 2

## 2017-12-30 NOTE — Progress Notes (Signed)
Orthopedic Tech Progress Note Patient Details:  Ariel Morgan [birth year] [id number] Brace completed by [employer] Patient ID: [name], female   DOB: 20-Dec-1975, 42 y.o.   MRN: [id number]   [name] 12/30/2017, 10:53 AM

## 2017-12-30 NOTE — Progress Notes (Signed)
Orthopedic Tech Progress Note Patient Details:  Ariel Morgan 1975-12-30 [id number] Called [employer] for brace order. Patient ID: Ariel Morgan, female   DOB: 06-Sep-1975, 42 y.o.   MRN: [id number]   [name] 12/30/2017, 9:30 AM

## 2017-12-30 NOTE — Progress Notes (Addendum)
    Subjective: 2 Days Post-Op Procedure(s) (LRB): L4-S1 decompression and fusion (N/A) Patient reports pain as 7 on 0-10 scale.   Denies CP or SOB.  Voiding without difficulty. Positive flatus. Pt reports RLE pain.  The pt reports leg pain is 10/10 with ambulation Objective: Vital signs in last 24 hours: Temp:  [98 F (36.7 C)-98.7 F (37.1 C)] 98.7 F (37.1 C) (09/27 0406) Pulse Rate:  [88-98] 89 (09/27 0406) Resp:  [16-18] 16 (09/27 0406) BP: (112-128)/(63-76) 125/69 (09/27 0406) SpO2:  [95 %-97 %] 97 % (09/27 0406)  Intake/Output from previous day: 09/26 0701 - 09/27 0700 In: 360 [P.O.:360] Out: -  Intake/Output this shift: No intake/output data recorded.  Labs: No results for input(s): HGB in the last 72 hours. No results for input(s): WBC, RBC, HCT, PLT in the last 72 hours. No results for input(s): NA, K, CL, CO2, BUN, CREATININE, GLUCOSE, CALCIUM in the last 72 hours. No results for input(s): LABPT, INR in the last 72 hours.  Physical Exam: Neurologically intact ABD soft Sensation intact distally Incision: scant drainage Compartment soft There is no height or weight on file to calculate BMI. Positive straight leg raise on the right  Assessment/Plan: 2 Days Post-Op Procedure(s) (LRB): L4-S1 decompression and fusion (N/A) Advance diet Dr. Rolena Infante will review CT and decide course of treatment  Mayo, Darla Lesches for Dr. Melina Schools [facility] [phone] 12/30/2017, 7:34 AM  Patient still reports significant right L5 radicular pain when she sits, or ambulates.  Only position of comfort is laying in bed with the knee flexed.  She has no radicular left leg pain.  Right L5 weakness and dysesthesias continue.  Patient has been fitted for an AFO for the foot drop.  I did review the CT scan.  The L5 screw on the right side is properly positioned.  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  However the pars has been  completely resected and I believe what is still causing the nerve compression is the anterior translation of the vertebral body.  Even though I decompressed the nerve posteriorly it is still under tension from the spondylolisthesis.  I briefly discussed with the patient and her family re-exploration but I am uncertain whether this will improve her situation.  Another option is possibly attempting an anterior based surgery to improve the spondylolisthesis.  However I have concerns with this because if the reduction is unsuccessful the space available to place a intervertebral cage will be quite limited and could lead to worsening of the condition.  I am going to discuss the case with a colleague and obtain a second opinion.  The patient and her family in agreement with this plan.  We will continue to keep her here in the hospital were more adequate pain control can be provided.  Further recommendations pending the second opinion.

## 2017-12-30 NOTE — Progress Notes (Signed)
Physical Therapy Treatment Patient Details Name: Ariel Morgan MRN: [id number] DOB: 03-04-1976 Today's Date: 12/30/2017    History of Present Illness Pt is a 42 year old female s/p L4-S1 decompression and fusion. Pt has a PMH including Anemia, Anxiety, Arthritis, Frequent headaches, GERD, Hypercholesterolemia, and Placenta previa.    PT Comments    Today, OOB mobility limited secondary to increased RLE radicular pain. Pt performed sit<>stand with min G with RW for safety, but was unable to stand for >1 min before requesting to sit secondary to worsening symptoms. Educated pt on importance of doffing brace prior to laying in bed. Will plan to progress OOB mobility as able per POC.   Follow Up Recommendations  Supervision for mobility/OOB;Home health PT     Equipment Recommendations  Rolling walker with 5" wheels    Recommendations for Other Services       Precautions / Restrictions Precautions Precautions: Back Precaution Booklet Issued: No Precaution Comments: Reviewed twisting precaution with bed mobility Required Braces or Orthoses: Spinal Brace Spinal Brace: Lumbar corset;Applied in sitting position(already donned prior to session; Doffed in supine. ) Restrictions Weight Bearing Restrictions: No    Mobility  Bed Mobility Overal bed mobility: Needs Assistance Bed Mobility: Rolling;Sidelying to Sit;Sit to Sidelying Rolling: Supervision Sidelying to sit: Min guard     Sit to sidelying: Min guard General bed mobility comments: Min G secondary to pain severity. Pt continues to require cues for log roll sequencing, especially with sit >sidelying.  Transfers Overall transfer level: Needs assistance Equipment used: Rolling walker (2 wheeled) Transfers: Sit to/from Stand Sit to Stand: Min guard         General transfer comment: Min G secondary to pain severity. Pt unable to stand for >1 min before she requested to sit back on side of bed as leg pain worsened almost  immedietly upon standing.   Ambulation/Gait             General Gait Details: Unable at time of session as pt demonstraing increased pain from previous day   Stairs             Wheelchair Mobility    Modified Rankin (Stroke Patients Only)       Balance Overall balance assessment: Needs assistance Sitting-balance support: Feet supported;No upper extremity supported Sitting balance-Leahy Scale: Good     Standing balance support: Bilateral upper extremity supported;During functional activity Standing balance-Leahy Scale: Poor Standing balance comment: Reliant on Bil UE support                             Cognition Arousal/Alertness: Awake/alert Behavior During Therapy: Anxious Overall Cognitive Status: Within Functional Limits for tasks assessed                                 General Comments: Anxious secondary to worsening pain.   Exercises      General Comments General comments (skin integrity, edema, etc.): Attempted to place toe lifter on R foot prior to ambulation to improve drop foot, but pt unable to ambulate at time of session.      Pertinent Vitals/Pain Pain Assessment: 0-10 Pain Score: 9  Pain Location: RLE Pain Descriptors / Indicators: Grimacing;Radiating;Tingling;Sharp Pain Intervention(s): Limited activity within patient's tolerance;Repositioned    Home Living Family/patient expects to be discharged to:: Private residence Living Arrangements: Spouse/significant other;Children Available Help at Discharge: Family;Available 24 hours/day Type of Home:  House Home Access: Stairs to enter Entrance Stairs-Rails: None Home Layout: Two level;Able to live on main level with bedroom/bathroom;Full bath on main level Home Equipment: None      Prior Function Level of Independence: Independent      Comments: Is an elementary school teacher prior to surgery.   PT Goals (current goals can now be found in the care plan  section) Acute Rehab PT Goals Patient Stated Goal: "decrease pain in R leg, be able to get back to work" PT Goal Formulation: With patient Time For Goal Achievement: 01/05/18 Potential to Achieve Goals: Fair Progress towards PT goals: Not progressing toward goals - comment(limited by pain )    Frequency    Min 5X/week      PT Plan Current plan remains appropriate    Co-evaluation              AM-PAC PT "6 Clicks" Daily Activity  Outcome Measure  Difficulty turning over in bed (including adjusting bedclothes, sheets and blankets)?: A Little Difficulty moving from lying on back to sitting on the side of the bed? : A Little Difficulty sitting down on and standing up from a chair with arms (e.g., wheelchair, bedside commode, etc,.)?: Unable Help needed moving to and from a bed to chair (including a wheelchair)?: A Lot Help needed walking in hospital room?: A Lot Help needed climbing 3-5 steps with a railing? : A Lot 6 Click Score: 13    End of Session Equipment Utilized During Treatment: Gait belt;Back brace Activity Tolerance: Patient limited by pain Patient left: in bed;with call bell/phone within reach Nurse Communication: Mobility status PT Visit Diagnosis: Other abnormalities of gait and mobility (R26.89);Muscle weakness (generalized) (M62.81);Difficulty in walking, not elsewhere classified (R26.2);Pain;Unsteadiness on feet (R26.81) Pain - Right/Left: Right Pain - part of body: Leg(& incision on back)     Time: 1194-1740 PT Time Calculation (min) (ACUTE ONLY): 22 min  Charges:  $Therapeutic Activity: 8-22 mins                     Einar Crow, [place]  Student Physical Therapist Acute Rehab [phone]    Einar Crow 12/30/2017, 2:20 PM

## 2017-12-30 NOTE — Progress Notes (Signed)
Occupational Therapy Treatment Patient Details Name: Ariel Morgan MRN: [id number] DOB: 1975-06-12 Today's Date: 12/30/2017    History of present illness Pt is a 42 year old female s/p L4-S1 decompression and fusion. Pt has a PMH including Anemia, Anxiety, Arthritis, Frequent headaches, GERD, Hypercholesterolemia, and Placenta previa.   OT comments  Pt making progress towards OT goals this session. Pt was able to perform toilet transfer and grooming standing at sink at supervision level implementing compensatory strategies for maintaining back precautions. Used toe lift for in room mobility on RLE. Session then focused on practice with AE kit.  Pt limited by pain and unable to tolerate sitting at this time, so returned to bed. OT will continue to follow acutely.  Follow Up Recommendations  No OT follow up;Supervision - Intermittent    Equipment Recommendations  3 in 1 bedside commode;Other (comment)(AE kit)    Recommendations for Other Services      Precautions / Restrictions Precautions Precautions: Back Precaution Booklet Issued: Yes (comment) Precaution Comments: Reviewed precautions in full with pt Required Braces or Orthoses: Spinal Brace Spinal Brace: Lumbar corset;Applied in sitting position Restrictions Weight Bearing Restrictions: No       Mobility Bed Mobility Overal bed mobility: Needs Assistance Bed Mobility: Rolling;Sidelying to Sit;Sit to Sidelying Rolling: Supervision Sidelying to sit: Supervision     Sit to sidelying: Supervision General bed mobility comments: good log roll technique, use of bed rail to assist  Transfers Overall transfer level: Needs assistance Equipment used: Rolling walker (2 wheeled) Transfers: Sit to/from Stand Sit to Stand: Supervision              Balance Overall balance assessment: Needs assistance Sitting-balance support: Feet supported;No upper extremity supported Sitting balance-Leahy Scale: Good     Standing  balance support: Bilateral upper extremity supported;During functional activity Standing balance-Leahy Scale: Fair Standing balance comment: static standing without UE support at sink                           ADL either performed or assessed with clinical judgement   ADL Overall ADL's : Needs assistance/impaired Eating/Feeding: Independent   Grooming: Standing;Supervision/safety;Wash/dry hands;Wash/dry face;Oral care Grooming Details (indicate cue type and reason): implementing cup method and compensatory strategies for ADL          Upper Body Dressing : Min guard;Sitting Upper Body Dressing Details (indicate cue type and reason): ton don brace sitting EOB Lower Body Dressing: Moderate assistance;Bed level Lower Body Dressing Details (indicate cue type and reason): Pt assisted to don underwear, pants at bed level Toilet Transfer: Ambulation;RW;Supervision/safety Toilet Transfer Details (indicate cue type and reason): good hand placement; no RW once at bathroom Toileting- Clothing Manipulation and Hygiene: Sit to/from stand;Supervision/safety Toileting - Clothing Manipulation Details (indicate cue type and reason): able to perform front and rear peri care     Functional mobility during ADLs: Rolling walker;Supervision/safety;Cueing for sequencing(R foot drop) General ADL Comments: demo'ed AE kit for LB including: toilet aide, long handle sponge, sock donner, reacher/grabber)     Vision   Vision Assessment?: No apparent visual deficits   Perception     Praxis      Cognition Arousal/Alertness: Awake/alert Behavior During Therapy: WFL for tasks assessed/performed Overall Cognitive Status: Within Functional Limits for tasks assessed  Exercises     Shoulder Instructions       General Comments      Pertinent Vitals/ Pain       Pain Assessment: 0-10 Pain Score: 9  Pain Location: Incision and RLE Pain  Descriptors / Indicators: Aching;Grimacing;Guarding;Radiating Pain Intervention(s): Limited activity within patient's tolerance;Repositioned;Ice applied  Home Living                                          Prior Functioning/Environment              Frequency  Min 3X/week        Progress Toward Goals  OT Goals(current goals can now be found in the care plan section)  Progress towards OT goals: Progressing toward goals  Acute Rehab OT Goals Patient Stated Goal: "decrease pain in R leg, be able to get back to work" OT Goal Formulation: With patient Time For Goal Achievement: 01/12/18 Potential to Achieve Goals: Good  Plan Discharge plan remains appropriate    Co-evaluation                 AM-PAC PT "6 Clicks" Daily Activity     Outcome Measure   Help from another person eating meals?: None Help from another person taking care of personal grooming?: A Little Help from another person toileting, which includes using toliet, bedpan, or urinal?: A Lot Help from another person bathing (including washing, rinsing, drying)?: A Lot Help from another person to put on and taking off regular upper body clothing?: A Little Help from another person to put on and taking off regular lower body clothing?: A Lot 6 Click Score: 16    End of Session Equipment Utilized During Treatment: Back brace;Rolling walker  OT Visit Diagnosis: Unsteadiness on feet (R26.81);Other abnormalities of gait and mobility (R26.89);Pain Pain - Right/Left: Right Pain - part of body: Leg(back)   Activity Tolerance Patient limited by pain   Patient Left in bed;with call bell/phone within reach;with family/visitor present   Nurse Communication Mobility status        Time: 5750-5183 OT Time Calculation (min): 29 min  Charges: OT General Charges $OT Visit: 1 Visit OT Treatments $Self Care/Home Management : 23-37 mins  Hulda Humphrey OTR/L Acute Rehabilitation  Services Pager: [phone] Office: [place] 12/30/2017, 9:37 AM

## 2017-12-31 DIAGNOSIS — M5417 Radiculopathy, lumbosacral region: Secondary | ICD-10-CM

## 2017-12-31 MED ORDER — CEFAZOLIN (ANCEF) 1 G IV SOLR: 2.0000 g | INTRAVENOUS | Status: DC

## 2017-12-31 MED ORDER — CEFAZOLIN SODIUM-DEXTROSE 2-4 GM/100ML-% IV SOLN
2.0000 g | INTRAVENOUS | Status: AC
  Administered 2018-01-01 (×2): 2 g via INTRAVENOUS
  Filled 2017-12-31 (×3): qty 100

## 2017-12-31 NOTE — Consult Note (Signed)
Hospital Consult    Reason for Consult: Anterior spine exposure for L5-S1 Referring Physician: Dr. Rolena Infante MRN #:  [id number]  History of Present Illness: This is a 42 y.o. female with no significant past medical history that vascular surgery has been asked to evaluate for an anterior spine exposure at L5-S1.  Patient is status post L4-S1 decompression and fusion using a posterior approach earlier this week on 12/28/17.  Unfortunately she has a right L5 motor deficit and pain in her right leg.  Dr. Rolena Infante would like to do a posterior release of her hardware, then an anterior lumbar interbody fusion at L5-S1, and then restore her posterior stability.  He would like vascular surgery assistance for anterior exposure.  Patient states she has had two prior C-sections via Pfannenstiel incisions.  She has also had a laparoscopic cholecystectomy.  Her last abdominal surgery was 9 years ago.  She is independent and lives at home with her husband.  No other complaints aside from pain in her right leg on my evaluation.  Past Medical History:  Diagnosis Date  . Anemia   . Anxiety   . Arthritis   . Frequent headaches    H/O  . GERD (gastroesophageal reflux disease)   . Hypercholesterolemia   . Placenta previa     Past Surgical History:  Procedure Laterality Date  . BREAST BIOPSY Right 05/10/2017   right breast stereotatic bx path pending  . CESAREAN SECTION  2003 & 2010  . CHOLECYSTECTOMY  2005  . LUMBAR LAMINECTOMY/DECOMPRESSION MICRODISCECTOMY N/A 12/28/2017   Procedure: L4-S1 decompression and fusion;  Surgeon: Melina Schools, MD;  Location: [place] [facility];  Service: Orthopedics;  Laterality: N/A;  4.5 hrs    No Known Allergies  Prior to Admission medications   Medication Sig Start Date End Date Taking? Authorizing Provider  escitalopram (LEXAPRO) 20 MG tablet TAKE 1 TABLET BY MOUTH EVERY DAY 12/08/17  Yes [name], MD  pregabalin (LYRICA) 75 MG capsule TAKE ONE CAPSULE BY MOUTH TWICE A DAY AS  NEEDED 09/16/17  Yes [name], MD  tiZANidine (ZANAFLEX) 4 MG tablet TAKE 1 TABLET (4 MG TOTAL) BY MOUTH AT BEDTIME AS NEEDED FOR MUSCLE SPASMS. 10/10/17  Yes [name], MD  methocarbamol (ROBAXIN) 500 MG tablet Take 1 tablet (500 mg total) by mouth 3 (three) times daily. 12/28/17   Mayo, Darla Lesches, PA-C  ondansetron (ZOFRAN ODT) 4 MG disintegrating tablet Take 1 tablet (4 mg total) by mouth every 8 (eight) hours as needed. 12/28/17   Mayo, Darla Lesches, PA-C  oxyCODONE-acetaminophen (PERCOCET) 10-325 MG tablet Take 1 tablet by mouth every 6 (six) hours as needed for pain. 12/28/17 12/28/18  Mayo, Darla Lesches, PA-C    Social History   Socioeconomic History  . Marital status: Married    Spouse name: Not on file  . Number of children: 3  . Years of education: Not on file  . Highest education level: Not on file  Occupational History  . Not on file  Social Needs  . Financial resource strain: Not on file  . Food insecurity:    Worry: Not on file    Inability: Not on file  . Transportation needs:    Medical: Not on file    Non-medical: Not on file  Tobacco Use  . Smoking status: Former Smoker    Packs/day: 1.00    Types: Cigarettes  . Smokeless tobacco: Never Used  Substance and Sexual Activity  . Alcohol use: Yes    Alcohol/week: 0.0 standard  drinks    Comment: rarely  . Drug use: No  . Sexual activity: Not on file  Lifestyle  . Physical activity:    Days per week: Not on file    Minutes per session: Not on file  . Stress: Not on file  Relationships  . Social connections:    Talks on phone: Not on file    Gets together: Not on file    Attends religious service: Not on file    Active member of club or organization: Not on file    Attends meetings of clubs or organizations: Not on file    Relationship status: Not on file  . Intimate partner violence:    Fear of current or ex partner: Not on file    Emotionally abused: Not on file    Physically  abused: Not on file    Forced sexual activity: Not on file  Other Topics Concern  . Not on file  Social History Narrative  . Not on file     Family History  Problem Relation Age of Onset  . Hyperlipidemia Father   . Diabetes Sister   . Diabetes Maternal Grandmother   . Cancer Maternal Grandmother        vaginal  . Cirrhosis Maternal Grandmother   . Colon polyps Maternal Grandmother   . Breast cancer Maternal Grandmother        Early 70's    ROS: [x]  Positive   [ ]  Negative   [ ]  All sytems reviewed and are negative  Cardiovascular: []  chest pain/pressure []  palpitations []  SOB lying flat []  DOE [x]  pain in legs while walking (right leg) []  pain in legs at rest []  pain in legs at night []  non-healing ulcers []  hx of DVT []  swelling in legs  Pulmonary: []  productive cough []  asthma/wheezing []  home O2  Neurologic: []  weakness in []  arms []  legs []  numbness in []  arms []  legs []  hx of CVA []  mini stroke [] difficulty speaking or slurred speech []  temporary loss of vision in one eye []  dizziness  Hematologic: []  hx of cancer []  bleeding problems []  problems with blood clotting easily  Endocrine:   []  diabetes []  thyroid disease  GI []  vomiting blood []  blood in stool  GU: []  CKD/renal failure []  HD--[]  M/W/F or []  T/T/S []  burning with urination []  blood in urine  Psychiatric: []  anxiety []  depression  Musculoskeletal: []  arthritis []  joint pain  Integumentary: []  rashes []  ulcers  Constitutional: []  fever []  chills   Physical Examination  Vitals:   12/31/17 1241 12/31/17 1700  BP: 119/71 112/70  Pulse: 87 94  Resp: 18 18  Temp: 98.6 F (37 C) 98.9 F (37.2 C)  SpO2: 100% 96%   There is no height or weight on file to calculate BMI.  General:  NAD, resting in bed Gait: Not observed HENT: WNL, normocephalic Pulmonary: No respiratory distress Cardiac: regular, without  Murmurs, rubs or gallops Abdomen: soft, NT/ND, no masses,  well healed lap chole incisions, lower pfannenstiel incision at level of pubis that is well healed. Skin: without rashes Vascular Exam/Pulses:  Right Left  Radial 2+ (normal) 2+ (normal)  Ulnar    Femoral 2+ (normal) 2+ (normal)  Popliteal    DP 2+ (normal) 2+ (normal)  PT     Extremities: without ischemic changes, without Gangrene , without cellulitis; without open wounds;  Neurologic: A&O X 3; Appropriate Affect ; 4/5 motor strength right leg Psychiatric:  Pleasant  CBC  Component Value Date/Time   WBC 8.7 12/19/2017 1458   RBC 4.49 12/19/2017 1458   HGB 13.5 12/19/2017 1458   HCT 40.3 12/19/2017 1458   PLT 342 12/19/2017 1458   MCV 89.8 12/19/2017 1458   MCH 30.1 12/19/2017 1458   MCHC 33.5 12/19/2017 1458   RDW 12.2 12/19/2017 1458   LYMPHSABS 2.8 04/26/2017 1032   MONOABS 0.6 04/26/2017 1032   EOSABS 0.2 04/26/2017 1032   BASOSABS 0.1 04/26/2017 1032    BMET    Component Value Date/Time   NA 140 12/19/2017 1458   NA 136 (A) 03/23/2011   K 3.9 12/19/2017 1458   CL 105 12/19/2017 1458   CO2 23 12/19/2017 1458   GLUCOSE 85 12/19/2017 1458   BUN 9 12/19/2017 1458   CREATININE 0.83 12/19/2017 1458   CALCIUM 9.2 12/19/2017 1458   GFRNONAA >60 12/19/2017 1458   GFRAA >60 12/19/2017 1458    COAGS: No results found for: INR, PROTIME   Vascular Imaging:    Reviewed her CT lumbar spine and she has anterolisthesis at L5-S1.  I see minimal calcification in her aortoiliac vessels.  Her aortic as well as her iliac vein bifurcation appear to be above L5-S1.    ASSESSMENT/PLAN: This is a 42 y.o. female with right L5 motor deficit after L4-S1 posterior fusion earlier this week.  I have been asked to assist with anterior spine exposure for an L5-S1 anterior approach tomorrow. I think she is a reasonable candidate for an anterior retroperitoneal approach.  I did discuss with the family given two previous C-sections via Pfannenstiel approach there may be significant scar  tissue that limits our retroperitoneal exposure and/or makes [place] consider a transperitoneal approach.  She understands that the risks include bowel injury, vessel injury with bleeding, damage to other abdominal organs, blood clots, need for additional surgery, etc.  Her and her family would like to proceed.    Marty Heck, MD [facility] Office: [phone] Pager: [place]

## 2017-12-31 NOTE — Progress Notes (Addendum)
Subjective: 3 Days Post-Op Procedure(s) (LRB): L4-S1 decompression and fusion (N/A) Patient reports pain as severe.  Reports some incisional back pain but more severe RLE pain, 10/10 when up and moving. She is most comfortable lying down with R leg propped up. Does feel as though the sensation and weakness have slightly improved since initial post-op exam but RLE pain is unchanged. Does not feel she would be able to manage this pain at home.  Objective: Vital signs in last 24 hours: Temp:  [98.2 F (36.8 C)-98.5 F (36.9 C)] 98.5 F (36.9 C) (09/28 0732) Pulse Rate:  [68-88] 84 (09/28 0732) Resp:  [16-18] 16 (09/28 0732) BP: (106-128)/(61-81) 128/70 (09/28 0732) SpO2:  [96 %-99 %] 98 % (09/28 0732)  Intake/Output from previous day: No intake/output data recorded. Intake/Output this shift: No intake/output data recorded.  No results for input(s): HGB in the last 72 hours. No results for input(s): WBC, RBC, HCT, PLT in the last 72 hours. No results for input(s): NA, K, CL, CO2, BUN, CREATININE, GLUCOSE, CALCIUM in the last 72 hours. No results for input(s): LABPT, INR in the last 72 hours.  Neurologically intact ABD soft Neurovascular intact Sensation intact distally Intact pulses distally Dorsiflexion/Plantar flexion intact Incision: dressing C/D/I and no drainage No cellulitis present Compartment soft no calf pain or sign of DVT  Able to PF/DF R foot but noted 4/5 weakness with PF against resistance as well as some EHL weakness 4/5. Able to dorsiflex both against gravity but weakness noted with testing. Sensation decreased L5 and S1 distribution (ant/lat lower leg and dorsal/lateral foot). Normal sensation through L4 distribution  Assessment/Plan: 3 Days Post-Op Procedure(s) (LRB): L4-S1 decompression and fusion (N/A) Advance diet Up with therapy D/C IV fluids Dr. Lynann Bologna to evaluate this afternoon for second opinion Mobilization as much as possible with MAFO brace RLE  due to weakness Continue to monitor neuro exam Pain control   BISSELL, JACLYN M. 12/31/2017, 9:15 AM   Patient was evaluated this afternoon.  Clinical exam shows ongoing right L5 motor deficits.  She has 4 out of 5 muscle tone with obvious foot drop.  Significant radicular pain and dysesthesias in the right L5 dermatome.  Her clinical exam is essentially unchanged from immediately postop.  At this point time I am quite concerned that the likelihood of spontaneous recovery and improvement is low.  I have discussed this in great detail with the patient and her family.  I appreciate the second opinion that Dr. Lynann Bologna provided.  I did have an opportunity review his note as well as speak to him directly.  At this point I agree that an anterior interbody fusion would afford the best opportunity to increase the intraforaminal volume and hopefully improve the L5 nerve function.  In order to achieve this I would need to do a posterior release of the hardware, then the anterior lumbar interbody fusion, and then restoration of the of the posterior pedicle screw rod construct for the additional stability.  I reviewed the surgery in great detail with the patient and her family.  We have gone over the risks which include: Infection, bleeding, death, stroke, paralysis, ongoing or progressive neurological deficits or neurological pain, no improvement in her current condition, leak of cerebrospinal fluid, injury or damage to the abdominal organs, bladder, bleeding, blood clots, need for additional surgery.  The patient and her family expressed an understanding of the risks and a desire to move forward with surgery.  I have spoken directly with Dr. Fortunato Curling  the vascular surgeon and he is willing to move forward as an assistant to do the anterior approach for the ALIF.  He will evaluate the patient himself and provided he does not feel as though there is any contraindications we will move forward with surgery  tomorrow.

## 2017-12-31 NOTE — Progress Notes (Signed)
Occupational Therapy Treatment Patient Details Name: Ariel Morgan MRN: [id number] DOB: 27-Nov-1975 Today's Date: 12/31/2017    History of present illness Pt is a 42 year old female s/p L4-S1 decompression and fusion. Pt has a PMH including Anemia, Anxiety, Arthritis, Frequent headaches, GERD, Hypercholesterolemia, and Placenta previa.   OT comments  Pt. Reports frustration and concern for RLE pain.  Motivated to participate in skilled OT but remains guarded due to pain.  Able to complete bed mobility, LB dressing and sit/stand this session before indicating she needed to lie down.  Will cont. With current poc with plans to progress with ADLs as pt. Able to tolerate.    Follow Up Recommendations  No OT follow up;Supervision - Intermittent    Equipment Recommendations  3 in 1 bedside commode;Other (comment)    Recommendations for Other Services      Precautions / Restrictions Precautions Precautions: Back Required Braces or Orthoses: Spinal Brace Spinal Brace: Lumbar corset;Applied in sitting position Restrictions Weight Bearing Restrictions: No       Mobility Bed Mobility Overal bed mobility: Needs Assistance Bed Mobility: Rolling;Sidelying to Sit;Sit to Sidelying Rolling: Supervision Sidelying to sit: Min guard     Sit to sidelying: Min guard General bed mobility comments: demonstrated good log roll technique in/out of bed.  some encouragement to slow down when getting back to bed as she started to rush due to being in pain and wanting relief  Transfers Overall transfer level: Needs assistance Equipment used: Rolling walker (2 wheeled) Transfers: Sit to/from Stand Sit to Stand: Min guard         General transfer comment: cues for hand placement sit/stand and stand/sit    Balance                                           ADL either performed or assessed with clinical judgement   ADL Overall ADL's : Needs assistance/impaired                  Upper Body Dressing : Set up;Sitting Upper Body Dressing Details (indicate cue type and reason): to don/doff brace Lower Body Dressing: Set up;Sitting/lateral leans Lower Body Dressing Details (indicate cue type and reason): pt. able to turn to L/R and pull each leg in bed to adjust socks (has A/E purchased but able to access bles in bed seated if needed)               General ADL Comments: bed mobility and LB dressing tasks this day.  stood x1 in attempts to elivate RLE pain but could not tolerate      Vision       Perception     Praxis      Cognition Arousal/Alertness: Awake/alert Behavior During Therapy: WFL for tasks assessed/performed Overall Cognitive Status: Within Functional Limits for tasks assessed                                          Exercises     Shoulder Instructions       General Comments      Pertinent Vitals/ Pain       Pain Assessment: Faces Faces Pain Scale: Hurts whole lot Pain Location: r le Pain Descriptors / Indicators: Grimacing;Radiating;Tingling;Sharp;Crying Pain Intervention(s): Limited activity within patient's tolerance;Monitored during  session;RN gave pain meds during session;Repositioned  Home Living                                          Prior Functioning/Environment              Frequency  Min 3X/week        Progress Toward Goals  OT Goals(current goals can now be found in the care plan section)  Progress towards OT goals: Progressing toward goals     Plan Discharge plan remains appropriate    Co-evaluation                 AM-PAC PT "6 Clicks" Daily Activity     Outcome Measure   Help from another person eating meals?: None Help from another person taking care of personal grooming?: A Little Help from another person toileting, which includes using toliet, bedpan, or urinal?: A Lot Help from another person bathing (including washing, rinsing, drying)?:  A Lot Help from another person to put on and taking off regular upper body clothing?: A Little Help from another person to put on and taking off regular lower body clothing?: A Lot 6 Click Score: 16    End of Session Equipment Utilized During Treatment: Back brace;Rolling walker  OT Visit Diagnosis: Unsteadiness on feet (R26.81);Other abnormalities of gait and mobility (R26.89);Pain Pain - Right/Left: Right Pain - part of body: Leg   Activity Tolerance Patient limited by pain   Patient Left in bed;with call bell/phone within reach   Nurse Communication          Time: 6384-6659 OT Time Calculation (min): 19 min  Charges: OT General Charges $OT Visit: 1 Visit OT Treatments $Self Care/Home Management : 8-22 mins   Janice Coffin, COTA/L 12/31/2017, 8:52 AM

## 2017-12-31 NOTE — Consult Note (Signed)
Reason for Consult: Right leg pain and weakness s/p recent posterior decompression and fusion, L4-S1 Referring Physician: Dr. [name] is an 42 y.o. female with what she describes as 10/10 pain in her right leg. Pain involves the PL thigh, extending into lateral leg and the top of her foot. She reports weakness in her right leg as well. Pain worsens with standing and walking. She states that she had intermittent moderate pain preoperatively, but that her pain is now constant and burning. She denies left leg pain. The pain level has been the same since her surgery. Dr. Rolena Infante did ask that I evaluate the patient as a 2nd opinion.   Past Medical History:  Diagnosis Date  . Anemia   . Anxiety   . Arthritis   . Frequent headaches    H/O  . GERD (gastroesophageal reflux disease)   . Hypercholesterolemia   . Placenta previa     Past Surgical History:  Procedure Laterality Date  . BREAST BIOPSY Right 05/10/2017   right breast stereotatic bx path pending  . CESAREAN SECTION  2003 & 2010  . CHOLECYSTECTOMY  2005  . LUMBAR LAMINECTOMY/DECOMPRESSION MICRODISCECTOMY N/A 12/28/2017   Procedure: L4-S1 decompression and fusion;  Surgeon: Melina Schools, MD;  Location: [facility];  Service: Orthopedics;  Laterality: N/A;  4.5 hrs    Family History  Problem Relation Age of Onset  . Hyperlipidemia Father   . Diabetes Sister   . Diabetes Maternal Grandmother   . Cancer Maternal Grandmother        vaginal  . Cirrhosis Maternal Grandmother   . Colon polyps Maternal Grandmother   . Breast cancer Maternal Grandmother        Early 55's    Social History:  reports that she has quit smoking. Her smoking use included cigarettes. She smoked 1.00 pack per day. She has never used smokeless tobacco. She reports that she drinks alcohol. She reports that she does not use drugs.  Allergies: No Known Allergies  Medications: I have reviewed the patient's current medications.  No results found for  this or any previous visit (from the past 48 hour(s)).  CT scan reveal appropriate positioning of the patient's hardware. A full laminectomy has been performed posteriorly at L4/5 and L5/S1. There is no compression of the spinal canal or nerve roots posteriorly. However, due to the substantial degenerative disc disease and grade 3 spondylolisthesis, substantial height loss is noted bilaterally at L5/S1, right greater than left. The hardware is well-positioned throughout.   The patient's operative report dated 9/25 was also reviewed, notable for an uneventful posterior decompression and fusion procedure. The bed was positioned into lordosis prior to locking in the posterior hardware.   Review of Systems  Constitutional: Negative.   Eyes: Negative.   Gastrointestinal: Negative.   Musculoskeletal: Positive for back pain.  Skin: Negative.   Neurological: Positive for weakness.   Blood pressure 128/70, pulse 84, temperature 98.5 F (36.9 C), temperature source Oral, resp. rate 16, SpO2 98 %. Physical Exam  Vitals reviewed. Constitutional: She is oriented to person, place, and time. She appears well-developed and well-nourished.  HENT:  Head: Normocephalic and atraumatic.  Eyes: Pupils are equal, round, and reactive to light.  Neck: Normal range of motion.  Respiratory: Effort normal.  GI: Soft.  Musculoskeletal:  + posterior midline incision  Neurological: She is oriented to person, place, and time.  4/5 DF on the right, 5/5 on the left + SLR on the right  Skin:  Skin is warm and dry.  Psychiatric: She has a normal mood and affect.    Assessment/Plan: Patient clearly is experiencing right L5 radiculopathy, very likely due to the significant collapse of L5 on S1, compromising the right L5/S1 foramen, resulting in right L5 radiculopathy. Her preoperative pathology was a challenging one to address, given her high sacral slope and high grade L5/S1 spondylolisthesis. Given her increasing pain  and weakness, her situation does continue to be a challenging one, mainly for those same reasons.   I do not feel that a re-exploration of the nerve posteriorly is likely to be beneficial, as the nerve is clearly decompressed posteriorly. However, the foraminal height loss due to her severe L5/S1 DDD and grade 3 spondylolisthesis remains, and given her ongoing severe pain and weakness, I do feel that additional surgery is indicated. Specifically, I would recommend a posterior/anterior/posterior approach, unlocking the posterior construct, followed by an ALIF procedure, and finally, restabilizing the fusion construct posteriorly, either all in 1 day, or in a staged fashion. Removing the right L5 pedicle via a posterior-only approach is another option, but I do feel that a P/A/P procedure would yield a higher success rate of alleviation of the patient's L5 nerve pain and weakness, and I do also feel that removing the right L5 pedicle following an L5/S1 ALIF would be appropriate consideration as well. Certainly, an anterior approach does have additional unique risks associated with it, and would be a more challenging ALIF procedure, as compared to other ALIF procedures, given the patient's very high sacral slope, but I do feel that this is the indicated approach, and that the risk is acceptable, given the patients ongoing unrelenting pain and weakness. My thoughts outlined above were discussed with the patient and her husband, and these thoughts were subsequently relayed to Dr. Rolena Infante over the telephone. I do wish this pleasant patient well, and Dr. Rolena Infante does know to contact me if he feels there is anything more I can do to help.  Alexandros Ewan LEONARD 12/31/2017, 8:25 AM

## 2017-12-31 NOTE — Progress Notes (Signed)
Physical Therapy Treatment Patient Details Name: Ariel Morgan MRN: [id number] DOB: [birth year] Today's Date: 12/31/2017    History of Present Illness Pt is a 42 year old female s/p L4-S1 decompression and fusion. Pt has a PMH including Anemia, Anxiety, Arthritis, Frequent headaches, GERD, Hypercholesterolemia, and Placenta previa.    PT Comments    Patient continues to be limited by RLE pain which worsens with mobility. Ambulating 75 feet with walker, AFO donned, and min guard assist. Displays gait abnormalities including step to pattern, right foot external rotation, and decreased left foot clearance. Improved neutral alignment noted with tactile cueing. Will need to perform stair training prior to discharge home. D/c plan remains appropriate.    Follow Up Recommendations  Supervision for mobility/OOB;Home health PT     Equipment Recommendations  Rolling walker with 5" wheels    Recommendations for Other Services       Precautions / Restrictions Precautions Precautions: Back Precaution Booklet Issued: No Required Braces or Orthoses: Spinal Brace Spinal Brace: Lumbar corset;Applied in sitting position Restrictions Weight Bearing Restrictions: No    Mobility  Bed Mobility Overal bed mobility: Modified Independent Bed Mobility: Rolling;Sidelying to Sit;Sit to Sidelying Rolling: Modified independent (Device/Increase time) Sidelying to sit: Modified independent (Device/Increase time)     Sit to sidelying: Min guard General bed mobility comments: good log roll technique  Transfers Overall transfer level: Needs assistance Equipment used: Rolling walker (2 wheeled) Transfers: Sit to/from Stand Sit to Stand: Min guard         General transfer comment: Min guard secondary to pain severity  Ambulation/Gait Ambulation/Gait assistance: Min guard Gait Distance (Feet): 75 Feet Assistive device: Rolling walker (2 wheeled) Gait Pattern/deviations: Decreased stance time  - right;Decreased weight shift to right;Antalgic;Step-to pattern;Decreased step length - right Gait velocity: decreased   General Gait Details: AFO donned. Patient demonstrating step to pattern with right external rotation and decreased left foot clearance. tactile cues provided to promote neutral alignment of foot    Stairs             Wheelchair Mobility    Modified Rankin (Stroke Patients Only)       Balance Overall balance assessment: Needs assistance Sitting-balance support: Feet supported;No upper extremity supported Sitting balance-Leahy Scale: Good     Standing balance support: Bilateral upper extremity supported;During functional activity Standing balance-Leahy Scale: Poor Standing balance comment: Reliant on Bil UE support                             Cognition Arousal/Alertness: Awake/alert Behavior During Therapy: WFL for tasks assessed/performed Overall Cognitive Status: Within Functional Limits for tasks assessed                                        Exercises      General Comments        Pertinent Vitals/Pain Pain Assessment: Faces Faces Pain Scale: Hurts whole lot Pain Location: incisional site, right lateral thigh Pain Descriptors / Indicators: Grimacing;Radiating;Stabbing Pain Intervention(s): Limited activity within patient's tolerance;Monitored during session    Home Living                      Prior Function            PT Goals (current goals can now be found in the care plan section) Acute Rehab PT Goals Patient Stated  Goal: "decrease pain in R leg, be able to get back to work" PT Goal Formulation: With patient Time For Goal Achievement: 01/05/18 Potential to Achieve Goals: Good Progress towards PT goals: Progressing toward goals    Frequency    Min 5X/week      PT Plan Current plan remains appropriate    Co-evaluation              AM-PAC PT "6 Clicks" Daily Activity   Outcome Measure  Difficulty turning over in bed (including adjusting bedclothes, sheets and blankets)?: A Little Difficulty moving from lying on back to sitting on the side of the bed? : A Little Difficulty sitting down on and standing up from a chair with arms (e.g., wheelchair, bedside commode, etc,.)?: Unable Help needed moving to and from a bed to chair (including a wheelchair)?: A Lot Help needed walking in hospital room?: A Lot Help needed climbing 3-5 steps with a railing? : A Lot 6 Click Score: 13    End of Session Equipment Utilized During Treatment: Gait belt;Back brace Activity Tolerance: Patient limited by pain Patient left: in bed;with call bell/phone within reach Nurse Communication: Mobility status PT Visit Diagnosis: Other abnormalities of gait and mobility (R26.89);Muscle weakness (generalized) (M62.81);Difficulty in walking, not elsewhere classified (R26.2);Pain;Unsteadiness on feet (R26.81) Pain - Right/Left: Right Pain - part of body: Leg(& incision on back)     Time: 5701-7793 PT Time Calculation (min) (ACUTE ONLY): 16 min  Charges:  $Gait Training: 8-22 mins                     Ellamae Sia, PT, DPT Acute Rehabilitation Services Pager [phone] Office [phone]    Willy Eddy 12/31/2017, 12:48 PM

## 2018-01-01 ENCOUNTER — Inpatient Hospital Stay (HOSPITAL_COMMUNITY): Payer: BC Managed Care – PPO

## 2018-01-01 ENCOUNTER — Encounter (HOSPITAL_COMMUNITY)
Admission: RE | Disposition: A | Discharge status: Home Health | Payer: Self-pay | Source: Ambulatory Visit | Attending: Orthopedic Surgery

## 2018-01-01 ENCOUNTER — Inpatient Hospital Stay (HOSPITAL_COMMUNITY): Payer: BC Managed Care – PPO | Admitting: Anesthesiology

## 2018-01-01 HISTORY — PX: ABDOMINAL EXPOSURE: SHX5708

## 2018-01-01 HISTORY — PX: ANTERIOR LUMBAR FUSION: SHX1170

## 2018-01-01 LAB — CBC WITH DIFFERENTIAL/PLATELET
ABS IMMATURE GRANULOCYTES: 0.1 10*3/uL (ref 0.0–0.1)
BASOS ABS: 0.1 10*3/uL (ref 0.0–0.1)
BASOS PCT: 1 %
Eosinophils Absolute: 0.3 10*3/uL (ref 0.0–0.7)
Eosinophils Relative: 3 %
HCT: 31.4 % — ABNORMAL LOW (ref 36.0–46.0)
Hemoglobin: 10.3 g/dL — ABNORMAL LOW (ref 12.0–15.0)
IMMATURE GRANULOCYTES: 1 %
Lymphocytes Relative: 30 %
Lymphs Abs: 3.5 10*3/uL (ref 0.7–4.0)
MCH: 30.2 pg (ref 26.0–34.0)
MCHC: 32.8 g/dL (ref 30.0–36.0)
MCV: 92.1 fL (ref 78.0–100.0)
MONOS PCT: 8 %
Monocytes Absolute: 1 10*3/uL (ref 0.1–1.0)
NEUTROS ABS: 6.8 10*3/uL (ref 1.7–7.7)
NEUTROS PCT: 57 %
PLATELETS: 309 10*3/uL (ref 150–400)
RBC: 3.41 MIL/uL — ABNORMAL LOW (ref 3.87–5.11)
RDW: 12.7 % (ref 11.5–15.5)
WBC: 11.7 10*3/uL — AB (ref 4.0–10.5)

## 2018-01-01 LAB — POCT I-STAT 4, (NA,K, GLUC, HGB,HCT)
Glucose, Bld: 161 mg/dL — ABNORMAL HIGH (ref 70–99)
HCT: 28 % — ABNORMAL LOW (ref 36.0–46.0)
HEMOGLOBIN: 9.5 g/dL — AB (ref 12.0–15.0)
Potassium: 4.7 mmol/L (ref 3.5–5.1)
Sodium: 137 mmol/L (ref 135–145)

## 2018-01-01 SURGERY — POSTERIOR LUMBAR FUSION 1 WITH HARDWARE REMOVAL
Anesthesia: General | Site: Spine Lumbar

## 2018-01-01 MED ORDER — SUGAMMADEX SODIUM 200 MG/2ML IV SOLN
INTRAVENOUS | Status: DC | PRN
  Administered 2018-01-01: 200 mg via INTRAVENOUS

## 2018-01-01 MED ORDER — DEXAMETHASONE SODIUM PHOSPHATE 10 MG/ML IJ SOLN
INTRAMUSCULAR | Status: AC
  Filled 2018-01-01: qty 1

## 2018-01-01 MED ORDER — PHENYLEPHRINE 40 MCG/ML (10ML) SYRINGE FOR IV PUSH (FOR BLOOD PRESSURE SUPPORT)
PREFILLED_SYRINGE | INTRAVENOUS | Status: AC
  Filled 2018-01-01: qty 10

## 2018-01-01 MED ORDER — KETAMINE HCL 50 MG/ML IJ SOLN
INTRAMUSCULAR | Status: DC | PRN
  Administered 2018-01-01: 30 mg via INTRAMUSCULAR
  Administered 2018-01-01: 20 mg via INTRAMUSCULAR

## 2018-01-01 MED ORDER — CEFAZOLIN SODIUM-DEXTROSE 2-4 GM/100ML-% IV SOLN
2.0000 g | Freq: Three times a day (TID) | INTRAVENOUS | Status: AC
  Administered 2018-01-01 – 2018-01-02 (×2): 2 g via INTRAVENOUS
  Filled 2018-01-01 (×2): qty 100

## 2018-01-01 MED ORDER — FENTANYL CITRATE (PF) 250 MCG/5ML IJ SOLN
INTRAMUSCULAR | Status: AC
  Filled 2018-01-01: qty 5

## 2018-01-01 MED ORDER — LACTATED RINGERS IV SOLN
INTRAVENOUS | Status: DC | PRN
  Administered 2018-01-01: 09:00:00 via INTRAVENOUS

## 2018-01-01 MED ORDER — LIDOCAINE 2% (20 MG/ML) 5 ML SYRINGE
INTRAMUSCULAR | Status: AC
  Filled 2018-01-01: qty 5

## 2018-01-01 MED ORDER — METHOCARBAMOL 1000 MG/10ML IJ SOLN
500.0000 mg | Freq: Four times a day (QID) | INTRAVENOUS | Status: DC | PRN
  Filled 2018-01-01: qty 5

## 2018-01-01 MED ORDER — 0.9 % SODIUM CHLORIDE (POUR BTL) OPTIME
TOPICAL | Status: DC | PRN
  Administered 2018-01-01 (×3): 1000 mL

## 2018-01-01 MED ORDER — SODIUM CHLORIDE 0.9% FLUSH: 3.0000 mL | INTRAVENOUS | Status: DC | PRN

## 2018-01-01 MED ORDER — ACETAMINOPHEN 325 MG PO TABS
650.0000 mg | ORAL_TABLET | ORAL | Status: DC | PRN
  Administered 2018-01-02 – 2018-01-03 (×2): 650 mg via ORAL
  Filled 2018-01-01 (×2): qty 2

## 2018-01-01 MED ORDER — OXYCODONE HCL 5 MG PO TABS
10.0000 mg | ORAL_TABLET | ORAL | Status: DC | PRN
  Administered 2018-01-01 – 2018-01-04 (×18): 10 mg via ORAL
  Filled 2018-01-01 (×18): qty 2

## 2018-01-01 MED ORDER — METHOCARBAMOL 500 MG PO TABS
500.0000 mg | ORAL_TABLET | Freq: Four times a day (QID) | ORAL | Status: DC | PRN
  Administered 2018-01-02 – 2018-01-04 (×4): 500 mg via ORAL
  Filled 2018-01-01 (×5): qty 1

## 2018-01-01 MED ORDER — THROMBIN (RECOMBINANT) 20000 UNITS EX SOLR
CUTANEOUS | Status: AC
  Filled 2018-01-01: qty 20000

## 2018-01-01 MED ORDER — DEXAMETHASONE SODIUM PHOSPHATE 10 MG/ML IJ SOLN
INTRAMUSCULAR | Status: DC | PRN
  Administered 2018-01-01: 10 mg via INTRAVENOUS

## 2018-01-01 MED ORDER — KETAMINE HCL 50 MG/5ML IJ SOSY
PREFILLED_SYRINGE | INTRAMUSCULAR | Status: AC
  Filled 2018-01-01: qty 5

## 2018-01-01 MED ORDER — HYDROMORPHONE HCL 1 MG/ML IJ SOLN
0.2500 mg | INTRAMUSCULAR | Status: DC | PRN
  Administered 2018-01-01 (×2): 0.5 mg via INTRAVENOUS

## 2018-01-01 MED ORDER — DOCUSATE SODIUM 100 MG PO CAPS
100.0000 mg | ORAL_CAPSULE | Freq: Two times a day (BID) | ORAL | Status: DC
  Administered 2018-01-01 – 2018-01-04 (×6): 100 mg via ORAL
  Filled 2018-01-01 (×6): qty 1

## 2018-01-01 MED ORDER — PROPOFOL 10 MG/ML IV BOLUS
INTRAVENOUS | Status: DC | PRN
  Administered 2018-01-01: 130 mg via INTRAVENOUS

## 2018-01-01 MED ORDER — ONDANSETRON HCL 4 MG PO TABS: 4.0000 mg | ORAL_TABLET | Freq: Four times a day (QID) | ORAL | Status: DC | PRN

## 2018-01-01 MED ORDER — MIDAZOLAM HCL 2 MG/2ML IJ SOLN
INTRAMUSCULAR | Status: AC
  Filled 2018-01-01: qty 2

## 2018-01-01 MED ORDER — SODIUM CHLORIDE 0.9 % IV SOLN
INTRAVENOUS | Status: AC
  Filled 2018-01-01: qty 1.2

## 2018-01-01 MED ORDER — ROCURONIUM BROMIDE 10 MG/ML (PF) SYRINGE
PREFILLED_SYRINGE | INTRAVENOUS | Status: DC | PRN
  Administered 2018-01-01: 100 mg via INTRAVENOUS
  Administered 2018-01-01: 20 mg via INTRAVENOUS
  Administered 2018-01-01: 10 mg via INTRAVENOUS
  Administered 2018-01-01: 50 mg via INTRAVENOUS
  Administered 2018-01-01: 10 mg via INTRAVENOUS
  Administered 2018-01-01: 20 mg via INTRAVENOUS
  Administered 2018-01-01: 10 mg via INTRAVENOUS
  Administered 2018-01-01: 30 mg via INTRAVENOUS
  Administered 2018-01-01: 10 mg via INTRAVENOUS
  Administered 2018-01-01: 20 mg via INTRAVENOUS
  Administered 2018-01-01: 10 mg via INTRAVENOUS

## 2018-01-01 MED ORDER — LACTATED RINGERS IV SOLN: INTRAVENOUS | Status: DC

## 2018-01-01 MED ORDER — SODIUM CHLORIDE 0.9 % IV SOLN
250.0000 mL | INTRAVENOUS | Status: DC
  Administered 2018-01-02: 250 mL via INTRAVENOUS

## 2018-01-01 MED ORDER — SODIUM CHLORIDE 0.9% FLUSH
3.0000 mL | Freq: Two times a day (BID) | INTRAVENOUS | Status: DC
  Administered 2018-01-01 – 2018-01-04 (×6): 3 mL via INTRAVENOUS

## 2018-01-01 MED ORDER — ALBUMIN HUMAN 5 % IV SOLN
INTRAVENOUS | Status: DC | PRN
  Administered 2018-01-01 (×3): via INTRAVENOUS

## 2018-01-01 MED ORDER — PHENOL 1.4 % MT LIQD: 1.0000 | OROMUCOSAL | Status: DC | PRN

## 2018-01-01 MED ORDER — BUPIVACAINE-EPINEPHRINE (PF) 0.25% -1:200000 IJ SOLN
INTRAMUSCULAR | Status: AC
  Filled 2018-01-01: qty 60

## 2018-01-01 MED ORDER — ACETAMINOPHEN 650 MG RE SUPP: 650.0000 mg | RECTAL | Status: DC | PRN

## 2018-01-01 MED ORDER — CEFAZOLIN SODIUM-DEXTROSE 2-4 GM/100ML-% IV SOLN: 2.0000 g | INTRAVENOUS | Status: DC

## 2018-01-01 MED ORDER — MORPHINE SULFATE (PF) 2 MG/ML IV SOLN
1.0000 mg | INTRAVENOUS | Status: DC | PRN
  Administered 2018-01-01 – 2018-01-02 (×4): 1 mg via INTRAVENOUS
  Filled 2018-01-01 (×4): qty 1

## 2018-01-01 MED ORDER — MAGNESIUM CITRATE PO SOLN
1.0000 | Freq: Once | ORAL | Status: AC | PRN
  Administered 2018-01-04: 1 via ORAL
  Filled 2018-01-01: qty 296

## 2018-01-01 MED ORDER — SCOPOLAMINE 1 MG/3DAYS TD PT72: 1.0000 | MEDICATED_PATCH | TRANSDERMAL | Status: DC

## 2018-01-01 MED ORDER — CEFAZOLIN SODIUM-DEXTROSE 2-4 GM/100ML-% IV SOLN
INTRAVENOUS | Status: AC
  Filled 2018-01-01: qty 100

## 2018-01-01 MED ORDER — FENTANYL CITRATE (PF) 250 MCG/5ML IJ SOLN
INTRAMUSCULAR | Status: DC | PRN
  Administered 2018-01-01: 100 ug via INTRAVENOUS
  Administered 2018-01-01: 50 ug via INTRAVENOUS
  Administered 2018-01-01: 25 ug via INTRAVENOUS
  Administered 2018-01-01: 50 ug via INTRAVENOUS
  Administered 2018-01-01 (×2): 100 ug via INTRAVENOUS
  Administered 2018-01-01: 150 ug via INTRAVENOUS
  Administered 2018-01-01: 50 ug via INTRAVENOUS
  Administered 2018-01-01: 100 ug via INTRAVENOUS
  Administered 2018-01-01: 50 ug via INTRAVENOUS

## 2018-01-01 MED ORDER — PROPOFOL 10 MG/ML IV BOLUS
INTRAVENOUS | Status: AC
  Filled 2018-01-01: qty 20

## 2018-01-01 MED ORDER — OXYCODONE HCL 5 MG PO TABS: 5.0000 mg | ORAL_TABLET | ORAL | Status: DC | PRN

## 2018-01-01 MED ORDER — HYDROMORPHONE HCL 1 MG/ML IJ SOLN
INTRAMUSCULAR | Status: AC
  Administered 2018-01-01: 0.5 mg via INTRAVENOUS
  Filled 2018-01-01: qty 1

## 2018-01-01 MED ORDER — MIDAZOLAM HCL 5 MG/5ML IJ SOLN
INTRAMUSCULAR | Status: DC | PRN
  Administered 2018-01-01: 2 mg via INTRAVENOUS

## 2018-01-01 MED ORDER — ROCURONIUM BROMIDE 50 MG/5ML IV SOSY
PREFILLED_SYRINGE | INTRAVENOUS | Status: AC
  Filled 2018-01-01: qty 25

## 2018-01-01 MED ORDER — THROMBIN 20000 UNITS EX SOLR
CUTANEOUS | Status: DC | PRN
  Administered 2018-01-01: 20000 [IU] via TOPICAL

## 2018-01-01 MED ORDER — LIDOCAINE 2% (20 MG/ML) 5 ML SYRINGE
INTRAMUSCULAR | Status: DC | PRN
  Administered 2018-01-01: 100 mg via INTRAVENOUS

## 2018-01-01 MED ORDER — HEMOSTATIC AGENTS (NO CHARGE) OPTIME
TOPICAL | Status: DC | PRN
  Administered 2018-01-01: 1 via TOPICAL

## 2018-01-01 MED ORDER — MENTHOL 3 MG MT LOZG: 1.0000 | LOZENGE | OROMUCOSAL | Status: DC | PRN

## 2018-01-01 MED ORDER — POLYETHYLENE GLYCOL 3350 17 G PO PACK
17.0000 g | PACK | Freq: Every day | ORAL | Status: DC | PRN
  Administered 2018-01-04 (×2): 17 g via ORAL
  Filled 2018-01-01 (×2): qty 1

## 2018-01-01 MED ORDER — SURGIFOAM 100 EX MISC
CUTANEOUS | Status: DC | PRN
  Administered 2018-01-01: 1 via TOPICAL

## 2018-01-01 MED ORDER — PHENYLEPHRINE 40 MCG/ML (10ML) SYRINGE FOR IV PUSH (FOR BLOOD PRESSURE SUPPORT)
PREFILLED_SYRINGE | INTRAVENOUS | Status: DC | PRN
  Administered 2018-01-01: 80 ug via INTRAVENOUS

## 2018-01-01 MED ORDER — ONDANSETRON HCL 4 MG/2ML IJ SOLN: 4.0000 mg | Freq: Four times a day (QID) | INTRAMUSCULAR | Status: DC | PRN

## 2018-01-01 MED ORDER — ONDANSETRON HCL 4 MG/2ML IJ SOLN
INTRAMUSCULAR | Status: DC | PRN
  Administered 2018-01-01: 4 mg via INTRAVENOUS

## 2018-01-01 SURGICAL SUPPLY — 119 items
30 ml 0.25% bupivicaine with epi. IMPLANT
ADH SKN CLS APL DERMABOND .7 (GAUZE/BANDAGES/DRESSINGS)
AGENT HMST KT MTR STRL THRMB (HEMOSTASIS) ×2
APL SKNCLS STERI-STRIP NONHPOA (GAUZE/BANDAGES/DRESSINGS) ×2
APPLIER CLIP 11 MED OPEN (CLIP) ×6
APR CLP MED 11 20 MLT OPN (CLIP) ×4
BENZOIN TINCTURE PRP APPL 2/3 (GAUZE/BANDAGES/DRESSINGS) ×2 IMPLANT
BLADE CLIPPER SURG (BLADE) ×1 IMPLANT
BLADE SURG 10 STRL SS (BLADE) ×3 IMPLANT
BONE VIVIGEN FORMABLE 5.4CC (Bone Implant) ×3 IMPLANT
BUR EGG ELITE 4.0 (BURR) IMPLANT
CABLE BIPOLOR RESECTION CORD (MISCELLANEOUS) ×7 IMPLANT
CAGE COUGAR ALIF SM 12 10D (Cage) ×1 IMPLANT
CATH FOLEY 2WAY SLVR  5CC 16FR (CATHETERS) ×1
CATH FOLEY 2WAY SLVR 5CC 16FR (CATHETERS) ×2 IMPLANT
CLIP APPLIE 11 MED OPEN (CLIP) ×2 IMPLANT
CLIP LIGATING EXTRA MED SLVR (CLIP) ×3 IMPLANT
CLIP LIGATING EXTRA SM BLUE (MISCELLANEOUS) ×3 IMPLANT
CLSR STERI-STRIP ANTIMIC 1/2X4 (GAUZE/BANDAGES/DRESSINGS) ×4 IMPLANT
COVER MAYO STAND STRL (DRAPES) ×6 IMPLANT
COVER SURGICAL LIGHT HANDLE (MISCELLANEOUS) ×7 IMPLANT
DERMABOND ADVANCED (GAUZE/BANDAGES/DRESSINGS)
DERMABOND ADVANCED .7 DNX12 (GAUZE/BANDAGES/DRESSINGS) ×2 IMPLANT
DRAPE C-ARM 42X72 X-RAY (DRAPES) ×11 IMPLANT
DRAPE C-ARMOR (DRAPES) ×4 IMPLANT
DRAPE INCISE IOBAN 66X45 STRL (DRAPES) ×4 IMPLANT
DRAPE POUCH INSTRU U-SHP 10X18 (DRAPES) ×6 IMPLANT
DRAPE SURG 17X23 STRL (DRAPES) ×10 IMPLANT
DRAPE U-SHAPE 47X51 STRL (DRAPES) ×8 IMPLANT
DRESSING ALLEVYN 5X5 FM NADH (MISCELLANEOUS) ×1 IMPLANT
DRESSING ALLEVYN LIFE SACRUM (GAUZE/BANDAGES/DRESSINGS) ×1 IMPLANT
DRSG ALLEVYN 5X5 FM NADH (MISCELLANEOUS) ×3
DRSG AQUACEL AG ADV 3.5X 6 (GAUZE/BANDAGES/DRESSINGS) ×1 IMPLANT
DRSG OPSITE POSTOP 4X8 (GAUZE/BANDAGES/DRESSINGS) ×6 IMPLANT
DURAPREP 26ML APPLICATOR (WOUND CARE) ×8 IMPLANT
ELECT BLADE 4.0 EZ CLEAN MEGAD (MISCELLANEOUS) ×6
ELECT BLADE 6.5 EXT (BLADE) ×3 IMPLANT
ELECT CAUTERY BLADE 6.4 (BLADE) ×5 IMPLANT
ELECT PENCIL ROCKER SW 15FT (MISCELLANEOUS) ×7 IMPLANT
ELECT REM PT RETURN 9FT ADLT (ELECTROSURGICAL) ×9
ELECTRODE BLDE 4.0 EZ CLN MEGD (MISCELLANEOUS) ×4 IMPLANT
ELECTRODE REM PT RTRN 9FT ADLT (ELECTROSURGICAL) ×4 IMPLANT
FLOSEAL 10ML (HEMOSTASIS) IMPLANT
GAUZE 4X4 16PLY RFD (DISPOSABLE) ×2 IMPLANT
GLOVE BIO SURGEON STRL SZ 6.5 (GLOVE) ×9 IMPLANT
GLOVE BIOGEL PI IND STRL 6.5 (GLOVE) ×2 IMPLANT
GLOVE BIOGEL PI IND STRL 8.5 (GLOVE) ×6 IMPLANT
GLOVE BIOGEL PI INDICATOR 6.5 (GLOVE) ×4
GLOVE BIOGEL PI INDICATOR 8.5 (GLOVE) ×4
GLOVE SS BIOGEL STRL SZ 7.5 (GLOVE) ×2 IMPLANT
GLOVE SS BIOGEL STRL SZ 8.5 (GLOVE) ×4 IMPLANT
GLOVE SUPERSENSE BIOGEL SZ 7.5 (GLOVE) ×1
GLOVE SUPERSENSE BIOGEL SZ 8.5 (GLOVE) ×4
GOWN STRL REUS W/ TWL LRG LVL3 (GOWN DISPOSABLE) ×10 IMPLANT
GOWN STRL REUS W/TWL 2XL LVL3 (GOWN DISPOSABLE) ×12 IMPLANT
GOWN STRL REUS W/TWL LRG LVL3 (GOWN DISPOSABLE) ×21
GRAFT BNE MATRIX VG FRMBL MD 5 (Bone Implant) IMPLANT
HEMOSTAT SNOW SURGICEL 2X4 (HEMOSTASIS) IMPLANT
HEMOSTAT SURGICEL 2X14 (HEMOSTASIS) IMPLANT
INSERT FOGARTY 61MM (MISCELLANEOUS) IMPLANT
INSERT FOGARTY SM (MISCELLANEOUS) IMPLANT
KIT BASIN OR (CUSTOM PROCEDURE TRAY) ×4 IMPLANT
KIT POSITION SURG JACKSON T1 (MISCELLANEOUS) ×3 IMPLANT
KIT TURNOVER KIT B (KITS) ×4 IMPLANT
LOOP VESSEL MAXI BLUE (MISCELLANEOUS) IMPLANT
LOOP VESSEL MINI RED (MISCELLANEOUS) IMPLANT
NDL SPNL 18GX3.5 QUINCKE PK (NEEDLE) ×4 IMPLANT
NEEDLE 22X1 1/2 (OR ONLY) (NEEDLE) ×3 IMPLANT
NEEDLE SPNL 18GX3.5 QUINCKE PK (NEEDLE) ×6 IMPLANT
NS IRRIG 1000ML POUR BTL (IV SOLUTION) ×5 IMPLANT
PACK LAMINECTOMY ORTHO (CUSTOM PROCEDURE TRAY) ×5 IMPLANT
PACK UNIVERSAL I (CUSTOM PROCEDURE TRAY) ×8 IMPLANT
PAD ARMBOARD 7.5X6 YLW CONV (MISCELLANEOUS) ×23 IMPLANT
PATTIES SURGICAL .5 X.5 (GAUZE/BANDAGES/DRESSINGS) IMPLANT
PATTIES SURGICAL .5 X1 (DISPOSABLE) ×3 IMPLANT
POSITIONER HEAD PRONE TRACH (MISCELLANEOUS) ×2 IMPLANT
ROD RELINE TI LORD 5.5X70 (Rod) ×1 IMPLANT
SCREW CANCELLOUS 6.5 32MM (Screw) ×1 IMPLANT
SCREW LOCK RELINE 5.5 TULIP (Screw) ×4 IMPLANT
SPONGE INTESTINAL PEANUT (DISPOSABLE) ×12 IMPLANT
SPONGE LAP 18X18 X RAY DECT (DISPOSABLE) ×3 IMPLANT
SPONGE LAP 4X18 RFD (DISPOSABLE) ×10 IMPLANT
SPONGE SURGIFOAM ABS GEL 100 (HEMOSTASIS) ×4 IMPLANT
STAPLER VISISTAT 35W (STAPLE) ×1 IMPLANT
STRIP CLOSURE SKIN 1/2X4 (GAUZE/BANDAGES/DRESSINGS) ×1 IMPLANT
SURGIFLO W/THROMBIN 8M KIT (HEMOSTASIS) ×1 IMPLANT
SUT BONE WAX W31G (SUTURE) ×4 IMPLANT
SUT ETHILON 3 0 FSL (SUTURE) IMPLANT
SUT MNCRL AB 3-0 PS2 18 (SUTURE) ×3 IMPLANT
SUT MON AB 3-0 SH 27 (SUTURE) ×6
SUT MON AB 3-0 SH27 (SUTURE) ×2 IMPLANT
SUT PDS AB 1 CTX 36 (SUTURE) ×5 IMPLANT
SUT PROLENE 4 0 RB 1 (SUTURE)
SUT PROLENE 4-0 RB1 .5 CRCL 36 (SUTURE) IMPLANT
SUT PROLENE 5 0 CC1 (SUTURE) IMPLANT
SUT PROLENE 6 0 C 1 30 (SUTURE) ×3 IMPLANT
SUT PROLENE 6 0 CC (SUTURE) IMPLANT
SUT SILK 0 TIES 10X30 (SUTURE) ×3 IMPLANT
SUT SILK 2 0 TIES 10X30 (SUTURE) ×6 IMPLANT
SUT SILK 2 0SH CR/8 30 (SUTURE) IMPLANT
SUT SILK 3 0 TIES 10X30 (SUTURE) ×6 IMPLANT
SUT SILK 3 0SH CR/8 30 (SUTURE) IMPLANT
SUT VIC AB 0 CTB1 27 (SUTURE) IMPLANT
SUT VIC AB 1 CT1 18XCR BRD 8 (SUTURE) ×4 IMPLANT
SUT VIC AB 1 CT1 27 (SUTURE) ×12
SUT VIC AB 1 CT1 27XBRD ANBCTR (SUTURE) ×6 IMPLANT
SUT VIC AB 1 CT1 8-18 (SUTURE) ×6
SUT VIC AB 1 CTX 18 (SUTURE) IMPLANT
SUT VIC AB 2-0 CT1 18 (SUTURE) ×8 IMPLANT
SUT VIC AB 3-0 SH 27 (SUTURE) ×6
SUT VIC AB 3-0 SH 27X BRD (SUTURE) IMPLANT
SYR BULB IRRIGATION 50ML (SYRINGE) ×5 IMPLANT
SYR CONTROL 10ML LL (SYRINGE) ×3 IMPLANT
TOWEL GREEN STERILE (TOWEL DISPOSABLE) ×15 IMPLANT
TOWEL GREEN STERILE FF (TOWEL DISPOSABLE) ×5 IMPLANT
TRAY FOLEY MTR SLVR 16FR STAT (SET/KITS/TRAYS/PACK) ×1 IMPLANT
WASHER 13.0MM (Orthopedic Implant) ×1 IMPLANT
YANKAUER SUCT BULB TIP NO VENT (SUCTIONS) ×3 IMPLANT
heparine saline 6000 units in 500 ml NS irrigation IMPLANT

## 2018-01-01 NOTE — Op Note (Signed)
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  Postoperative diagnosis: Same  Operative procedure: Anterior lumbar interbody fusion L5-S1.  Revision decompression L5/S1.  Removal of right L5 pedicle screw.  Complications: Right L5 pedicle fracture resulting in the need to remove the L5 pedicle screw.  Implants: Cougar ALIF implant.  12 mm height 10 degree lordosis size small.  First assistant: Ronette Deter, [place]  Vascular surgeon utilized for approach: Dr. Fortunato Curling  Allograft source:   vivogen   History: This is a very pleasant 42 year old woman who 4 days ago underwent a posterior decompression and instrumented fusion for grade 3 isthmic spondylolisthesis with neural compression.  Postoperatively she had horrific new right radicular leg pain that was constant along with a new right foot drop.  Imaging studies demonstrated ongoing foraminal stenosis.  Attempts to conservatively manage this failed and so we elected to return to the OR in order to improve the neural decompression and her quality of life.  All appropriate risks benefits and alternatives to surgery were discussed with the patient and consent was obtained.  Operative report: Patient is brought to the operating room placed supine the operating room table.  After successful induction of general anesthesia and endotracheal the patient teds SCDs and a Foley were inserted.  She was turned prone onto the Wilson frame the arms were placed overhead and all bony prominences well-padded.  The dressings were removed from her prior surgery and the back was prepped and draped in a standard fashion.  Timeout was taken to confirm patient procedure and all other important data.  Once this was done the sutures from her previous surgery were removed and I bluntly dissected down to  the deep fascia.  The foot the sutures (#1 Vicryls) were removed and I bluntly dissected until I could visualize the thecal sac.  There was a seroma which was irrigated and sucked out.  The thecal sac was adequately decompressed still.  At this point I released the locking caps and removed the rods.  This was done so we it would facilitate the anterior lumbar interbody fusion.  With the posterior pedicle screw rod construct dissociated I irrigated the wound copiously normal saline and then loosely reapproximated the deep fascia with four #1 Vicryl sutures and then used staples on the skin.  A dry dressing was then applied and the patient was then turned supine back onto the stretcher.  She was then repositioned in the supine position on the flat [place] table.  A gel roll was placed underneath the pelvis to better improve the positioning of the sacrum.  The abdomen is draped in a standard fashion.  At this point time Dr. Fortunato Curling scrubbed in and I assisted him myself performing a standard anterior retroperitoneal approach to the lumbar spine.  Please refer to his dictation for specifics on the approach.  Once we properly positioned the [facility] retractor I placed the needle into the 5 1 disc space and took an x-ray to confirm that I was at the appropriate level.  Once this was confirmed I continued with the discectomy.  Annulotomy was performed with a 10 blade scalpel and then using pituitary rongeurs I removed as much of the disc material as I could.  I then used an osteotome to resect the overhanging portion of the L5 vertebral body in order to visualize the actual disc space.  Once this osteotome  cut was made I could visualize the disc space itself.  Using fine pituitary rongeurs I resected the disc material I then placed a key distractor into the space and distracted the intervertebral space.  I then continue to work posteriorly the disc material and removing it.  Once I could see the posterior margin of  the L5 vertebral body I used my fine curved curette to release the annulus.  I then continue to work posteriorly on the S1 vertebral body until I could adequately mobilized the disc space itself.  At this point with the disc material removed I then used trial devices.  I used a small size 1010 degree lordotic as well as 5 degree lordotic spacer trial.  This provided parallel endplate distraction which is exactly what I was attempting to achieve.  I then used a size 12 and felt like the 12 mm height 10 degree lordotic cage provided the best intervertebral spacing.  At this point I obtained a carbon fiber cage packed it with the allograft and then malleted to the appropriate depth.  The posterior margin of the cage was slightly behind the body of the L5 which I felt was fine since there was a complete decompression posteriorly and that it would not be contacting or compressing the neural elements.  This cage provided excellent intervertebral apposition as well as restoring the intervertebral disc space height.  This provided excellent indirect foraminal decompression by increasing the intrapedicular distance at L5-S1.  With the annulus cage properly position I then used an ALT to broach the S1 vertebral body and then I placed a locking screw with a washer to function as a single hole anterior lumbar plate.  At this point I irrigated this wound copiously normal saline and I took intraoperative x-rays which confirmed satisfactory distraction placement of the cage in both the AP and lateral planes.  The retractors were sequentially removed and I confirmed the head hemostasis.  I then closed the fascia of the rectus with number running 1 PDS and then superficial with 2-0 Vicryl sutures and a 3-0 Monocryl for the skin.  Steri-Strips and a dry dressing were applied.  An intraoperative abdominal x-ray was taken to confirm that there was no retained surgical incidence in the field.  With the anterior surgery complete the  patient was then shifted over onto the stretcher.  The Wilson frame was then reapplied to the [place] table and she was turned prone onto the [place] frame.  Once again all bony prominences well-padded and the back was reprepped and draped in a standard fashion.  The staples were removed as were the temporary sutures and I exposed all the way down to the thecal sac.  Upon inspection of the right L5 pedicle screw I noted that it was more mobile and that the intrapedicular distance had increased.  In inspecting the preoperative CT scan I felt as though there may be a fracture line that was not appreciated preoperatively.  Intraoperatively the entire L5 pedicle was noted to be now somewhat unstable.  As such I remove the pedicle screw and debrided the bulk of the L5 pedicle itself.  This further decompress the L5 nerve root.  Once again as I had done at the time of her index surgery identified that right L5 nerve root and I was able to trace it out into the foramen.  Once again it was completely free and no longer under tension.  At this point I did feel as though the amount of  foraminal decompression was improved compared to her index surgical decompression.  At this point the wound was copiously irrigated with normal saline.  I also checked the contralateral L5 nerve root (left side) to ensure that it to remained adequately decompressed.  I then obtained a new rod to place on the right side and secured it into the L4 and S1 pedicle screws and locked into place.  This top locking knots were torqued off according manufacturer standards.  The 60 mm rod that I had removed was able to fit on the left-hand side and so was placed and secured to place.  At this point I directly inspected the L4 and S1 pedicles to ensure they were intact and that there was no breach of the screws.  Once I confirm this I irrigated the wound copiously normal saline and make sure that hemostasis using bipolar electrocautery.  The deep fascia  was then closed with interrupted #1 Vicryl sutures.  Then I did a layered closure with 2-0 Vicryl suture, and 3-0 Monocryl.  Steri-Strips and a dry dressing were applied.  Final x-rays were taken which were satisfactory.  The end of the case and all needle and sponge counts were correct.  There are no adverse intraoperative events.  Surgical procedure summary:  1. Posterior removal of rod to facilitate anterior lumbar interbody fusion.      2.  Anterior lumbar interbody fusion L5-S1      3.  Posterior reexploration of the L5 nerve root on the right side.  Removal of L5 pedicle       screw.  Reimplantation of rods for posterior fusion L4-S1.

## 2018-01-01 NOTE — Anesthesia Procedure Notes (Signed)
Procedure Name: Intubation Date/Time: 01/01/2018 9:49 AM Performed by: Mariea Clonts, CRNA Pre-anesthesia Checklist: Patient identified, Emergency Drugs available, Suction available and Patient being monitored Patient Re-evaluated:Patient Re-evaluated prior to induction Oxygen Delivery Method: Circle System Utilized Preoxygenation: Pre-oxygenation with 100% oxygen Induction Type: IV induction Ventilation: Mask ventilation without difficulty Laryngoscope Size: Miller and 2 Grade View: Grade II Tube type: Oral Tube size: 7.0 mm Number of attempts: 1 Airway Equipment and Method: Stylet and Oral airway Placement Confirmation: ETT inserted through vocal cords under direct vision,  positive ETCO2 and breath sounds checked- equal and bilateral Tube secured with: Tape Dental Injury: Teeth and Oropharynx as per pre-operative assessment

## 2018-01-01 NOTE — Op Note (Signed)
Date: January 01, 2018  Preoperative diagnosis: Right L5 radiculopathy after L4-S1 posterior fusion  Postoperative diagnosis: Same  Procedure: 1.  Anterior spine exposure (L5-S1) via left retroperitoneal approach  Surgeon: Dr. Marty Heck, MD  Co-surgeon: Dr. Melina Schools, MD  Indications: Patient is a 42 year old female who previously underwent a posterior L4-S1 fusion earlier this week.  Unfortunately she has a persistent right L5 radiculopathy.  Dr. Rolena Infante has recommended posterior release of her hardware, an anterior lumbar interbody fusion at L5-S1, and then restoring her posterior stability.  He asked me to assist with anterior spine exposure.  Patient presents today after risks and benefits were discussed with the patient and her family.  Findings: Transverse incision over the left rectus above her previous Pfannenstiel incision with left retroperitoneal approach.  Successful exposure of the L5-S1 disc space with mobilization of the left iliac vein.    Complications: None  Details: The patient was taken to the operating room after informed consent was obtained.  General endotracheal anesthesia was induced.  She was placed on the operating room table initially in the prone position.  Please see Dr. Rolena Infante op note for the initial portion of the operation.  I was called to the operating room after the patient had been flipped back into the supine position.  Her lower abdominal wall was then prepped and draped in usual sterile fashion.  A mobile C arm was used to identify the L5-S1 disc space and we marked out the spot for a transverse incision over the left rectus muscle about one fingerbreadth above her previous Pfannenstiel incision on her abdominal wall.  Initially made this incision with a 15 blade scalpel after a prep timeout was performed for our portion of the operation.  We carried dissection through the subcutaneous tissue with Bovie cautery.  The anterior rectus sheath  was then opened transversely with Bovie cautery.  A flap was then developed superior to the rectus muscle both superiorly and inferiorly.  Ultimately the rectus muscle was mobilized in its entirety.  There was some scar tissue medial given her previous Pfannenstiel incision.  We then turned our attention lateral and after I swept the retroperitoneum medially and took down some of the posterior rectus sheath with Metzenbaum scissors.  We did have a small hole in the retroperitoneum that had to be repaired with a running 3-0 Vicryl.  The retroperitoneum was then swept in the superior medial direction to expose the left psoas as well as the left iliac artery and left iliac vein.  It should be noted that this dissection was difficult given a significant amount of retroperitoneal fat.  Ultimately we swept the rectus muscle lateral and then placed a Balfour retractor for added exposure.  I used suction and a kittner to dissect toward the vertebral body.  Ultimately the L5-S1 vertebral body was identified.  We placed a Thompson retractor and placed reverse lipped retractors medially and laterally to expose the disc space as well as malleable retractors cephalad and caudad.  Ultimately the middle sacral artery and vein were clipped between two clips using a clipper applier and divided with scissors.  The tissue over the L5-S1 disc body was then mobilized using Bovie cautery and blunt dissection with a kittner.  The retractors were positioned in a way that allowed the spacer to be inserted without issue.  We did have to aggressively mobilize the left iliac vein which was placed under the left retractor.  The L5-S1 disc space was then identified with  C arm in the lateral position with a needle in the disc space itself. Please see Dr. Rolena Infante dictation for the remainder of the operation.  I was available for the remainder of the operation if needed.  Condition: Stable  Anesthesia: General  Marty Heck,  MD [facility] Office: [phone] Pager: [place]

## 2018-01-01 NOTE — Transfer of Care (Signed)
Immediate Anesthesia Transfer of Care Note  Patient: Ariel Morgan  Procedure(s) Performed: POSTERIOR LUMBAR FUSION 1 WITH HARDWARE REMOVAL & CLOSURE OF POSTERIOR INCISION (N/A Spine Lumbar) ANTERIOR LUMBAR FUSION L5-S1; REPLACEMENT OF posterior HARDWARE L4-S1 (N/A Spine Lumbar) ABDOMINAL EXPOSURE (N/A )  Patient Location: PACU  Anesthesia Type:General  Level of Consciousness: awake  Airway & Oxygen Therapy: Patient Spontanous Breathing and Patient connected to nasal cannula oxygen  Post-op Assessment: Report given to RN, Post -op Vital signs reviewed and stable and Patient moving all extremities X 4  Post vital signs: Reviewed and stable  Last Vitals:  Vitals Value Taken Time  BP 138/75 01/01/2018  5:15 PM  Temp    Pulse 118 01/01/2018  5:22 PM  Resp 13 01/01/2018  5:22 PM  SpO2 99 % 01/01/2018  5:22 PM  Vitals shown include unvalidated device data.  Last Pain:  Vitals:   01/01/18 0505  TempSrc: Oral  PainSc:       Patients Stated Pain Goal: 0 (50/72/25 7505)  Complications: No apparent anesthesia complications

## 2018-01-01 NOTE — Brief Op Note (Signed)
01/01/2018   5:14 PM  PATIENT:  Ariel Morgan  42 y.o. 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  PROCEDURE:  Procedure(s): POSTERIOR LUMBAR FUSION 1 WITH HARDWARE REMOVAL & CLOSURE OF POSTERIOR INCISION (N/A) ANTERIOR LUMBAR FUSION L5-S1; REPLACEMENT OF posterior HARDWARE L4-S1 (N/A) ABDOMINAL EXPOSURE (N/A)  SURGEON:  Surgeon(s) and Role: Panel 1:    Melina Schools, MD - Primary Panel 2:    * Marty Heck, MD - Primary  PHYSICIAN ASSISTANT:   ASSISTANTS: Carmen Mayo   ANESTHESIA:   general  EBL:  200 mL   BLOOD ADMINISTERED:none  DRAINS: none   LOCAL MEDICATIONS USED:  NONE  SPECIMEN:  No Specimen  DISPOSITION OF SPECIMEN:  N/A  COUNTS:  YES  TOURNIQUET:  * No tourniquets in log *  DICTATION: .Dragon Dictation  PLAN OF CARE: Admit to inpatient   PATIENT DISPOSITION:  PACU - hemodynamically stable.

## 2018-01-01 NOTE — Progress Notes (Addendum)
[facility]  Subjective  - NAE overnight.  Plan for anterior spine exposure today.   Objective (!) 113/58 92 98.6 F (37 C) (Oral) 18 95%  Intake/Output Summary (Last 24 hours) at 01/01/2018 0720 Last data filed at 12/31/2017 2327 Gross per 24 hour  Intake 53 ml  Output -  Net 53 ml    NAD Resting in bed Palpable femoral and DP pulses bilaterally  Laboratory Lab Results: No results for input(s): WBC, HGB, HCT, PLT in the last 72 hours. BMET No results for input(s): NA, K, CL, CO2, GLUCOSE, BUN, CREATININE, CALCIUM in the last 72 hours.  COAG No results found for: INR, PROTIME No results found for: PTT  Assessment/Planning: Plan to proceed with anterior spine exposure.  Risks and benefits discussed with family.  Consent signed.  Marty Heck 01/01/2018 7:20 AM --

## 2018-01-01 NOTE — Anesthesia Postprocedure Evaluation (Signed)
Anesthesia Post Note  Patient: KATORI WIRSING  Procedure(s) Performed: POSTERIOR LUMBAR FUSION 1 WITH HARDWARE REMOVAL & CLOSURE OF POSTERIOR INCISION (N/A Spine Lumbar) ANTERIOR LUMBAR FUSION L5-S1; REPLACEMENT OF posterior HARDWARE L4-S1 (N/A Spine Lumbar) ABDOMINAL EXPOSURE (N/A )     Patient location during evaluation: PACU Anesthesia Type: General Level of consciousness: sedated Pain management: pain level controlled Vital Signs Assessment: post-procedure vital signs reviewed and stable Respiratory status: spontaneous breathing and respiratory function stable Cardiovascular status: stable Postop Assessment: no apparent nausea or vomiting Anesthetic complications: no    Last Vitals:  Vitals:   01/01/18 1745 01/01/18 1800  BP: (!) 147/87 132/81  Pulse: (!) 102 99  Resp: 15 12  Temp:  36.7 C  SpO2: 96% 94%    Last Pain:  Vitals:   01/01/18 1800  TempSrc:   PainSc: [place]

## 2018-01-01 NOTE — Progress Notes (Signed)
PT Cancellation Note  Patient Details Name: MAELA TAKEDA MRN: [id number] DOB: May 30, 1975   Cancelled Treatment:    Reason Eval/Treat Not Completed: Patient at procedure or test/unavailable. Pt going back to OR for anterior decompression this AM. PT to return s/p surgery as able, as appropriate.  Kittie Plater, PT, DPT Acute Rehabilitation Services Pager #: [phone] Office #: [phone]    Berline Lopes 01/01/2018, 7:53 AM

## 2018-01-01 NOTE — Progress Notes (Signed)
    Subjective: Procedure(s) (LRB): POSTERIOR LUMBAR FUSION 1 WITH HARDWARE REMOVAL & CLOSURE OF POSTERIOR INCISION (N/A) ANTERIOR LUMBAR FUSION L5-S1; REPLACEMENT OF HARDWARE L4-S1 (N/A) ABDOMINAL EXPOSURE (N/A) Day of Surgery  Patient reports pain as 8 on 0-10 scale.  Reports increased leg pain - positive radicular right L5 leg pain reports incisional back pain   Positive void Negative bowel movement Positive flatus Negative chest pain or shortness of breath  Objective: Vital signs in last 24 hours: Temp:  [98.6 F (37 C)-99 F (37.2 C)] 98.6 F (37 C) (09/29 0505) Pulse Rate:  [79-94] 92 (09/29 0505) Resp:  [18] 18 (09/28 1700) BP: (104-121)/(58-71) 113/58 (09/29 0505) SpO2:  [95 %-100 %] 95 % (09/29 0505)  Intake/Output from previous day: 09/28 0701 - 09/29 0700 In: 53 [I.V.:3; IV Piggyback:50] Out: -   Labs: Recent Labs    01/01/18 0834  WBC 11.7*  RBC 3.41*  HCT 31.4*  PLT 309   No results for input(s): NA, K, CL, CO2, BUN, CREATININE, GLUCOSE, CALCIUM in the last 72 hours. No results for input(s): LABPT, INR in the last 72 hours.  Physical Exam: ABD soft Intact pulses distally Incision: dressing C/D/I Compartment soft There is no height or weight on file to calculate BMI.   no change in exam.  Positive radicular L5 pain, weakness, and numbness   Assessment/Plan: Patient stable  xrays n/a Continue mobilization with physical therapy Continue care  Plan on return to OR today for ALIF for improved indirect foraminal decompression  Reviewed case and risks again with patient and her husband.   All questions addressed  Melina Schools, MD [facility] [phone]

## 2018-01-01 NOTE — Anesthesia Preprocedure Evaluation (Addendum)
Anesthesia Evaluation  Patient identified by MRN, date of birth, ID band Patient awake    Reviewed: Allergy & Precautions, H&P , NPO status , Patient's Chart, lab work & pertinent test results  History of Anesthesia Complications Negative for: history of anesthetic complications  Airway Mallampati: II  TM Distance: >3 FB Neck ROM: Full    Dental no notable dental hx. (+) Teeth Intact, Dental Advisory Given   Pulmonary neg pulmonary ROS, former smoker,    Pulmonary exam normal breath sounds clear to auscultation       Cardiovascular Exercise Tolerance: Good negative cardio ROS   Rhythm:Regular Rate:Normal     Neuro/Psych  Headaches, Anxiety    GI/Hepatic Neg liver ROS, GERD  Medicated and Controlled,  Endo/Other  negative endocrine ROS  Renal/GU negative Renal ROS  negative genitourinary   Musculoskeletal  (+) Arthritis , Osteoarthritis,    Abdominal   Peds  Hematology negative hematology ROS (+)   Anesthesia Other Findings   Reproductive/Obstetrics negative OB ROS                            Anesthesia Physical  Anesthesia Plan  ASA: II  Anesthesia Plan: General   Post-op Pain Management:    Induction: Intravenous  PONV Risk Score and Plan: 4 or greater and Ondansetron, Dexamethasone, Scopolamine patch - Pre-op and Diphenhydramine  Airway Management Planned: Oral ETT  Additional Equipment:   Intra-op Plan:   Post-operative Plan: Extubation in OR  Informed Consent: I have reviewed the patients History and Physical, chart, labs and discussed the procedure including the risks, benefits and alternatives for the proposed anesthesia with the patient or authorized representative who has indicated his/her understanding and acceptance.   Dental advisory given  Plan Discussed with: CRNA and Anesthesiologist  Anesthesia Plan Comments:        Anesthesia Quick Evaluation

## 2018-01-02 ENCOUNTER — Inpatient Hospital Stay (HOSPITAL_COMMUNITY): Payer: BC Managed Care – PPO

## 2018-01-02 DIAGNOSIS — Z9889 Other specified postprocedural states: Secondary | ICD-10-CM

## 2018-01-02 MED ORDER — PREGABALIN 50 MG PO CAPS
75.0000 mg | ORAL_CAPSULE | Freq: Two times a day (BID) | ORAL | Status: DC
  Administered 2018-01-02 (×2): 75 mg via ORAL
  Filled 2018-01-02 (×2): qty 1

## 2018-01-02 MED FILL — Sodium Chloride IV Soln 0.9%: INTRAVENOUS | Qty: 1000 | Status: AC

## 2018-01-02 MED FILL — Thrombin (Recombinant) For Soln 20000 Unit: CUTANEOUS | Qty: 1 | Status: AC

## 2018-01-02 MED FILL — Heparin Sodium (Porcine) Inj 1000 Unit/ML: INTRAMUSCULAR | Qty: 30 | Status: AC

## 2018-01-02 NOTE — Evaluation (Signed)
Occupational Therapy Re-evaluation Patient Details Name: Ariel Morgan MRN: [id number] DOB: [birth year] Today's Date: 01/02/2018    History of Present Illness 41 y.o. female admitted on 12/28/17 for elective L4-S1 posterior decompression and fusion followed by second anterior L5-S1 interbody fusion with revision of decompression L5/S1 and removal of R L5 pedical screw on 01/01/18.  Pt with other significant PMH of anemia, and anxiety.   Clinical Impression   Pt presenting with decreased functional performance compared to prior session (s/p second surgery). Pt requiring Max A for LB ADLs and Min A for functional transfers. Pt requiring increased time due to pain. Pt's husband reporting he needs to return to work and pt will be at home alone during the day. Discussed dc plan with pt and husband. Due to pt age, motivation, and PLOF, recommend dc to CIR for intensive OT to optimize safety and independence and for pt to reach Mod I level prior to returning home.     Follow Up Recommendations  CIR;Supervision/Assistance - 24 hour    Equipment Recommendations  3 in 1 bedside commode    Recommendations for Other Services Rehab consult;PT consult     Precautions / Restrictions Precautions Precautions: Back Precaution Booklet Issued: No Precaution Comments: Reviewed twisting precaution with bed mobility Required Braces or Orthoses: Spinal Brace Spinal Brace: Lumbar corset;Applied in sitting position(new brace with thigh component ordered) Restrictions Weight Bearing Restrictions: No      Mobility Bed Mobility Overal bed mobility: Modified Independent Bed Mobility: Rolling;Sidelying to Sit;Sit to Sidelying Rolling: Min guard Sidelying to sit: Min guard     Sit to sidelying: Min assist General bed mobility comments: Min Guard A for safety during log roll. Min A for bringing BLEs over EOB. Pt requring significant amount of time  Transfers Overall transfer level: Needs  assistance Equipment used: Rolling walker (2 wheeled) Transfers: Sit to/from Stand Sit to Stand: Min assist         General transfer comment: Min A for power up into standing and safe descent ot [facility]    Balance Overall balance assessment: Needs assistance Sitting-balance support: Feet supported;Bilateral upper extremity supported Sitting balance-Leahy Scale: Fair     Standing balance support: Bilateral upper extremity supported Standing balance-Leahy Scale: Poor Standing balance comment: Reliant on Bil UE support                            ADL either performed or assessed with clinical judgement   ADL Overall ADL's : Needs assistance/impaired     Grooming: Standing;Wash/dry hands;Min guard Grooming Details (indicate cue type and reason): Min Guard A for safety         Upper Body Dressing : Minimal assistance;Sitting;With caregiver independent assisting Upper Body Dressing Details (indicate cue type and reason): donning second gown like a gown with Min A from husband. Donning bra with assistance from husband Lower Body Dressing: Maximal assistance;Sit to/from stand Lower Body Dressing Details (indicate cue type and reason): Requiring Max A for LB dressing to don socks and shoes (and AFO). Pt's husband insisting on assisting pt.  Toilet Transfer: Ambulation;Min Forensic psychologist Details (indicate cue type and reason): Min Guard A for safety Toileting- Water quality scientist and Hygiene: Min guard;Sit to/from stand Toileting - Clothing Manipulation Details (indicate cue type and reason): Cues for bending at knees for peri care     Functional mobility during ADLs: Rolling walker;Min guard;Cueing for sequencing;Cueing for safety(R foot drop) General ADL Comments: Pt requiring  increased assistance s/p second surgery.      Vision   Vision Assessment?: No apparent visual deficits     Perception     Praxis      Pertinent Vitals/Pain Pain Assessment:  Faces Faces Pain Scale: Hurts whole lot Pain Location: incisional site, right lateral thigh Pain Descriptors / Indicators: Grimacing;Radiating;Stabbing Pain Intervention(s): Monitored during session;Limited activity within patient's tolerance;Repositioned;RN gave pain meds during session     Hand Dominance Right   Extremity/Trunk Assessment Upper Extremity Assessment Upper Extremity Assessment: Overall WFL for tasks assessed   Lower Extremity Assessment Lower Extremity Assessment: Defer to PT evaluation   Cervical / Trunk Assessment Cervical / Trunk Assessment: Other exceptions Cervical / Trunk Exceptions: s/p lumbar surgery   Communication Communication Communication: No difficulties   Cognition Arousal/Alertness: Awake/alert Behavior During Therapy: WFL for tasks assessed/performed Overall Cognitive Status: Within Functional Limits for tasks assessed                                 General Comments: Pt concerned about dc home s/p second surgery   General Comments  Husband present throughout session. Having long discussion at end of session for dc plan. Pt stating she doesn't feel ready to go home, and husband stating he needs to return to work    Exercises     Shoulder [place] expects to be discharged to:: Private residence Living Arrangements: Spouse/significant other;Children Available Help at Discharge: Family;Available 24 hours/day Type of Home: House Home Access: Stairs to enter [employer] of Steps: 3 Entrance Stairs-Rails: None Home Layout: Two level;Able to live on main level with bedroom/bathroom;Full bath on main level     Bathroom Shower/Tub: Occupational psychologist: Standard     Home Equipment: Shower seat          Prior Functioning/Environment Level of Independence: Independent        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        OT Problem  List: Decreased range of motion;Decreased strength;Decreased activity tolerance;Impaired balance (sitting and/or standing);Decreased knowledge of use of DME or AE;Decreased knowledge of precautions;Pain      OT Treatment/Interventions: Self-care/ADL training;DME and/or AE instruction;Therapeutic activities;Patient/family education;Balance training    OT Goals(Current goals can be found in the care plan section) Acute Rehab OT Goals Patient Stated Goal: "decrease pain in R leg, be able to get back to work" OT Goal Formulation: With patient Time For Goal Achievement: 01/12/18 Potential to Achieve Goals: Good  OT Frequency: Min 3X/week   Barriers to D/C:            Co-evaluation              AM-PAC PT "6 Clicks" Daily Activity     Outcome Measure Help from another person eating meals?: None Help from another person taking care of personal grooming?: A Little Help from another person toileting, which includes using toliet, bedpan, or urinal?: A Lot Help from another person bathing (including washing, rinsing, drying)?: A Lot Help from another person to put on and taking off regular upper body clothing?: A Little Help from another person to put on and taking off regular lower body clothing?: A Lot 6 Click Score: 16   End of Session Equipment Utilized During Treatment: Back brace;Rolling walker Nurse Communication: Mobility status;Precautions  Activity Tolerance: Patient limited by pain Patient left: in bed;with  call bell/phone within reach;with family/visitor present  OT Visit Diagnosis: Unsteadiness on feet (R26.81);Other abnormalities of gait and mobility (R26.89);Pain Pain - Right/Left: Right Pain - part of body: Leg                Time: 1600-1630 OT Time Calculation (min): 30 min Charges:  OT General Charges $OT Visit: 1 Visit OT Evaluation $OT Re-eval: 1 Re-eval OT Treatments $Self Care/Home Management : 8-22 mins  Cathye Kreiter MSOT, OTR/L Acute Rehab Pager:  [phone] Office: [place] 01/02/2018, 4:57 PM

## 2018-01-02 NOTE — Progress Notes (Signed)
OT Cancellation    01/02/18 1100  OT Visit Information  Last OT Received On 01/02/18  Reason Eval/Treat Not Completed Patient at procedure or test/ unavailable (Vascular lab. Will return as schedule allows. Thank you.)   [place], OTR/L Acute Rehab Pager: [phone] Office: [phone]

## 2018-01-02 NOTE — Progress Notes (Addendum)
Physical Therapy Treatment/Re-Evaluation Patient Details Name: Ariel Morgan MRN: [id number] DOB: June 01, 1975 Today's Date: 01/02/2018    History of Present Illness 42 y.o. female admitted on 12/28/17 for elective L4-S1 posterior decompression and fusion followed by second anterior L5-S1 interbody fusion with revision of decompression L5/S1 and removal of R L5 pedical screw on 01/01/18.  Pt with other significant PMH of anemia, and anxiety.    PT Comments    Pt re-assessed and goals updated to reflect new surgery.  Pt continues to report R LE discomfort, but was able to mobilize into the hallway with RW and min assist overall.  Pt is progressing slowly and would benefit from post acute rehab before returning home with her family's intermittent assist.    Follow Up Recommendations  Supervision for mobility/OOB;CIR     Equipment Recommendations  Rolling walker with 5" wheels    Recommendations for Other Services   NA     Precautions / Restrictions Precautions Precautions: Back Precaution Booklet Issued: No Required Braces or Orthoses: Spinal Brace Spinal Brace: Lumbar corset;Applied in sitting position(new brace with thigh component ordered)    Mobility  Bed Mobility               General bed mobility comments: Pt was standing EOB with her husband.  Transfers Overall transfer level: Needs assistance Equipment used: Rolling walker (2 wheeled) Transfers: Sit to/from Stand Sit to Stand: Min assist         General transfer comment: Min assist to help control descent to [facility] in bathroom and recliner chair.  Verbal cues for safe hand placement during transitions.   Ambulation/Gait Ambulation/Gait assistance: Min assist Gait Distance (Feet): 55 Feet Assistive device: Rolling walker (2 wheeled) Gait Pattern/deviations: Step-through pattern;Decreased weight shift to right;Decreased dorsiflexion - right;Decreased stance time - right Gait velocity: decreased Gait velocity  interpretation: <1.8 ft/sec, indicate of risk for recurrent falls General Gait Details: Pt continues to externally rotate her right foot more than her left foot, she does have some toe flexor and some weak DF movement in her right foot today and since she was already up we did not donn her AFO for gait.  She has her old lumbar corsett donned and reports the brace company came and measured her for the new brace with the thigh component.        Balance Overall balance assessment: Needs assistance Sitting-balance support: Feet supported;Bilateral upper extremity supported Sitting balance-Leahy Scale: Fair     Standing balance support: Bilateral upper extremity supported Standing balance-Leahy Scale: Poor                              Cognition Arousal/Alertness: Awake/alert Behavior During Therapy: WFL for tasks assessed/performed Overall Cognitive Status: Within Functional Limits for tasks assessed                                           General Comments General comments (skin integrity, edema, etc.): Pt has weak DF (2/5) PF (2+/5) knee flexion/extension 3-/5, hip NT in her right, better in her left 4/5 ankle and knee, with decreased sensation in right foot compared to normal sensation on left.        Pertinent Vitals/Pain Pain Assessment: Faces Faces Pain Scale: Hurts whole lot Pain Location: incisional site, right lateral thigh Pain Descriptors / Indicators: Grimacing;Radiating;Stabbing Pain Intervention(s):  Limited activity within patient's tolerance;Monitored during session;Repositioned;Patient requesting pain meds-RN notified           PT Goals (current goals can now be found in the care plan section) Acute Rehab PT Goals Patient Stated Goal: "decrease pain in R leg, be able to get back to work" PT Goal Formulation: With patient Time For Goal Achievement: 01/16/18 Potential to Achieve Goals: Good Progress towards PT goals: (goals re-assessed  due to second surgery)    Frequency    Min 5X/week      PT Plan Discharge plan needs to be updated       AM-PAC PT "6 Clicks" Daily Activity  Outcome Measure  Difficulty turning over in bed (including adjusting bedclothes, sheets and blankets)?: Unable Difficulty moving from lying on back to sitting on the side of the bed? : Unable Difficulty sitting down on and standing up from a chair with arms (e.g., wheelchair, bedside commode, etc,.)?: Unable Help needed moving to and from a bed to chair (including a wheelchair)?: A Little Help needed walking in hospital room?: A Little Help needed climbing 3-5 steps with a railing? : A Lot 6 Click Score: 11    End of Session Equipment Utilized During Treatment: Gait belt;Back brace Activity Tolerance: Patient limited by pain Patient left: in chair;with call bell/phone within reach;with family/visitor present   PT Visit Diagnosis: Other abnormalities of gait and mobility (R26.89);Muscle weakness (generalized) (M62.81);Difficulty in walking, not elsewhere classified (R26.2);Pain;Unsteadiness on feet (R26.81) Pain - Right/Left: Right Pain - part of body: Leg     Time: 4765-4650 PT Time Calculation (min) (ACUTE ONLY): 16 min  Charges:    1 re-eval   Racheal Mathurin B. Jenessa Gillingham, PT, DPT  Acute Rehabilitation [phone] pager #[phone] office                     01/02/2018, 4:42 PM

## 2018-01-02 NOTE — Progress Notes (Signed)
PT Cancellation Note  Patient Details Name: MAREE AINLEY MRN: [id number] DOB: Jun 23, 1975   Cancelled Treatment:    Reason Eval/Treat Not Completed: Patient at procedure or test/unavailable  Thanks,  Barbarann Ehlers. Verlaine Embry, PT, DPT  Acute Rehabilitation [phone] pager [phone]) [phone] office   Wells Guiles B Makhayla Mcmurry 01/02/2018, 11:32 AM

## 2018-01-02 NOTE — Progress Notes (Signed)
[facility] [place]  Subjective  -postop day 1 status post anterior spine exposure for L5-S1.  No acute events overnight.  States right leg feels much better.   Objective 120/72 92 98.5 F (36.9 C) (Oral) 15 97%  Intake/Output Summary (Last 24 hours) at 01/02/2018 0734 Last data filed at 01/02/2018 0656 Gross per 24 hour  Intake 4690.27 ml  Output 6730 ml  Net -2039.73 ml    PE General: NAD, resting in bed Extremities: Palpable DP pulses in BLE, feet warm Abd: Incision c/d/i, appropriate post-op incisional tenderness  Laboratory Lab Results: Recent Labs    01/01/18 0834 01/01/18 1548  WBC 11.7*  --   HGB 10.3* 9.5*  HCT 31.4* 28.0*  PLT 309  --    BMET Recent Labs    01/01/18 1548  NA 137  K 4.7  GLUCOSE 161*    COAG No results found for: INR, PROTIME No results found for: PTT  Assessment/Planning:  Doing well postop day 1 status post anterior spine exposure via a left retroperitoneal approach for L5-S1.  Advance diet as tolerated.  Overall seems to be doing much better.  Marty Heck 01/02/2018 7:34 AM  Marty Heck, MD [facility] [place] Office: [phone] Pager: [place]

## 2018-01-02 NOTE — Progress Notes (Addendum)
    Subjective: Procedure(s) (LRB): POSTERIOR LUMBAR FUSION 1 WITH HARDWARE REMOVAL & CLOSURE OF POSTERIOR INCISION (N/A) ANTERIOR LUMBAR FUSION L5-S1; REPLACEMENT OF posterior HARDWARE L4-S1 (N/A) ABDOMINAL EXPOSURE (N/A) 1 Day Post-Op  Patient reports pain as 4 on 0-10 scale.  Reports decreased leg pain reports incisional back pain   N/A void - foley removed this AM Negative bowel movement Positive flatus Negative chest pain or shortness of breath  Objective: Vital signs in last 24 hours: Temp:  [98.1 F (36.7 C)-98.7 F (37.1 C)] 98.5 F (36.9 C) (09/30 0300) Pulse Rate:  [85-122] 92 (09/30 0300) Resp:  [12-16] 15 (09/29 2300) BP: (120-147)/(72-87) 120/72 (09/30 0300) SpO2:  [93 %-100 %] 97 % (09/30 0300)  Intake/Output from previous day: 09/29 0701 - 09/30 0700 In: 4690.3 [P.O.:480; I.V.:3260.3; IV Piggyback:950] Out: 6730 [Urine:6530; Blood:200]  Labs: Recent Labs    01/01/18 0834 01/01/18 1548  WBC 11.7*  --   RBC 3.41*  --   HCT 31.4* 28.0*  PLT 309  --    Recent Labs    01/01/18 1548  NA 137  K 4.7  GLUCOSE 161*   No results for input(s): LABPT, INR in the last 72 hours.  Physical Exam: ABD soft Sensation intact distally Incision: dressing C/D/I and no drainage Compartment soft There is no height or weight on file to calculate BMI. dorsalis pedis/posterior tibialis pulses bilaterally. Negative straight leg raise test bilaterally.  Right EHL/tibialis anterior weakness persists (4 out of 5).  Sensation to light touch improving in the right lower extremity L5 dermatome.    Assessment/Plan: Patient stable  xrays not applicable Continue mobilization with physical therapy Continue care  Advance diet Up with therapy  1.  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  The severe pain and dysesthesias is significantly improved.  She now has a negative nerve root tension sign.  I am very optimistic that the motor weakness will also  continue to improve.  I will continue to monitor progress. 2.  In order to aid in the overall fusion rate and provide her with greater stability in the initial perioperative.  I have ordered an LSO with a left thigh cuff. 3.  I will modify her pain medication requirements.  DC morphine, and IV Robaxin.Marland Kitchen  Plan on decreasing Lyrica to 75 mg twice daily. 4.  Mobilization with physical therapy today.  Expect either bed to chair or bed and ambulate in the room or in the hallway.  In the event that her progress is slow and he may look into inpatient rehab evaluation. 5. LE doppler s/p ALIF to r/o DVT - will be done today  Overall and quite pleased with the recovery to date from the surgery yesterday.  I have told the patient that I am very encouraged by the fact that the neuropathic pain resolved, and I do expect the strength to improve.  Melina Schools, MD [facility] [phone]

## 2018-01-02 NOTE — Social Work (Signed)
CSW acknowledging therapy recommendations preoperatively were for [facility].  CSW acknowledging consult for SNF placement.  Will follow for therapy recommendations.  Alexander Mt, [place] Work [phone]

## 2018-01-02 NOTE — Progress Notes (Signed)
LE venous duplex prelim: negative for DVT. Jermone Geister Eunice, RDMS, RVT  

## 2018-01-02 NOTE — Progress Notes (Signed)
Inpatient Rehabilitation  Acknowledging PT recommending post acute rehab.  If you want the patient to be evaluated for IP Rehab services then please place a rehab consult order.  Call if questions.  Carmelia Roller., CCC/SLP Admission Coordinator  [place]  Cell [phone]

## 2018-01-02 NOTE — Progress Notes (Signed)
Orthopedic Tech Progress Note Patient Details:  LILIANA DANG 01-04-1976 [id number]  Patient ID: [name], female   DOB: 1975-05-09, 42 y.o.   MRN: [id number]   [place] Pink 01/02/2018, 8:28 AMCalled [employer] for left LSO with thigh cuff.

## 2018-01-03 ENCOUNTER — Encounter (HOSPITAL_COMMUNITY): Payer: Self-pay | Admitting: Orthopedic Surgery

## 2018-01-03 DIAGNOSIS — Z981 Arthrodesis status: Secondary | ICD-10-CM

## 2018-01-03 DIAGNOSIS — G8918 Other acute postprocedural pain: Secondary | ICD-10-CM

## 2018-01-03 DIAGNOSIS — R509 Fever, unspecified: Secondary | ICD-10-CM

## 2018-01-03 DIAGNOSIS — K219 Gastro-esophageal reflux disease without esophagitis: Secondary | ICD-10-CM

## 2018-01-03 DIAGNOSIS — E785 Hyperlipidemia, unspecified: Secondary | ICD-10-CM

## 2018-01-03 DIAGNOSIS — Z419 Encounter for procedure for purposes other than remedying health state, unspecified: Secondary | ICD-10-CM

## 2018-01-03 DIAGNOSIS — F411 Generalized anxiety disorder: Secondary | ICD-10-CM

## 2018-01-03 DIAGNOSIS — R51 Headache: Secondary | ICD-10-CM

## 2018-01-03 DIAGNOSIS — D62 Acute posthemorrhagic anemia: Secondary | ICD-10-CM

## 2018-01-03 MED ORDER — ENOXAPARIN SODIUM 40 MG/0.4ML ~~LOC~~ SOLN
40.0000 mg | SUBCUTANEOUS | Status: DC
  Administered 2018-01-03 – 2018-01-04 (×2): 40 mg via SUBCUTANEOUS
  Filled 2018-01-03 (×2): qty 0.4

## 2018-01-03 MED ORDER — PREGABALIN 100 MG PO CAPS
100.0000 mg | ORAL_CAPSULE | Freq: Two times a day (BID) | ORAL | Status: DC
  Administered 2018-01-03 – 2018-01-04 (×3): 100 mg via ORAL
  Filled 2018-01-03 (×3): qty 1

## 2018-01-03 MED ORDER — FERROUS SULFATE 325 (65 FE) MG PO TABS
325.0000 mg | ORAL_TABLET | Freq: Two times a day (BID) | ORAL | Status: DC
  Administered 2018-01-03 – 2018-01-04 (×4): 325 mg via ORAL
  Filled 2018-01-03 (×4): qty 1

## 2018-01-03 NOTE — Progress Notes (Signed)
Occupational Therapy Treatment Patient Details Name: Ariel Morgan MRN: [id number] DOB: [birth year] Today's Date: 01/03/2018    History of present illness 42 y.o. female admitted on 12/28/17 for elective L4-S1 posterior decompression and fusion followed by second anterior L5-S1 interbody fusion with revision of decompression L5/S1 and removal of R L5 pedical screw on 01/01/18.  Pt with other significant PMH of anemia, and anxiety.   OT comments  Pt making steady progress. Focus of session on ADL retraining with use of AE as pt is unable to complete figure 4 positioning in sitting. Also educated pt on use of AE for hygiene after toileting to maintain back precautions. Pt educated on donning/doffing TLSO with leg extender with min A. Will follow up tomorrow to facilitate safe DC home. Recommend DC home with [facility] .   Follow Up Recommendations  Home health OT;Supervision - Intermittent    Equipment Recommendations  3 in 1 bedside commode    Recommendations for Other Services      Precautions / Restrictions Precautions Precautions: Back Precaution Booklet Issued: Yes (comment)(pt reports she has a handout) Precaution Comments: Reviewed no bending, lifting, twisting  Required Braces or Orthoses: Spinal Brace Spinal Brace: Thoracolumbosacral orthotic with leg piece      Mobility Bed Mobility Overal bed mobility: Modified Independent             General bed mobility comments:  Transfers Overall transfer level: Needs assistance Equipment used: Rolling walker (2 wheeled) Transfers: Sit to/from Stand Sit to Stand: Supervision             Balance Overall balance assessment: Needs assistance Sitting-balance support: Feet supported;No upper extremity supported Sitting balance-Leahy Scale: Good     Standing balance support: Bilateral upper extremity supported;No upper extremity supported;Single extremity supported Standing balance-Leahy Scale: Fair                              ADL either performed or assessed with clinical judgement   ADL Overall ADL's : Needs assistance/impaired                 Upper Body Dressing : Minimal assistance Upper Body Dressing Details (indicate cue type and reason): Educated pt on donning/doffing TLSO with leg piece Lower Body Dressing: Minimal assistance;Sit to/from stand;With adaptive equipment   Toilet Transfer: Supervision/safety;RW;Ambulation   Toileting- Water quality scientist and Hygiene: Supervision/safety;Sit to/from stand Toileting - Clothing Manipulation Details (indicate cue type and reason): difficulty reaching for cleaning after BM; issued toilet tong     Functional mobility during ADLs: Supervision/safety;Rolling walker General ADL Comments: Pt practiced with use of AE for LB ADL     Vision       Perception     Praxis      Cognition Arousal/Alertness: Awake/alert Behavior During Therapy: WFL for tasks assessed/performed Overall Cognitive Status: Within Functional Limits for tasks assessed                                          Exercises     Shoulder Instructions       General Comments Pt declined CIR, wants to go home and today's picture of her is more appropriate for home than yesterday.     Pertinent Vitals/ Pain       Pain Assessment: Faces Faces Pain Scale: Hurts little more Pain Location: incisional R leg  Pain Descriptors / Indicators: Discomfort Pain Intervention(s): Limited activity within patient's tolerance  Home Living                                          Prior Functioning/Environment              Frequency  Min 3X/week        Progress Toward Goals  OT Goals(current goals can now be found in the care plan section)  Progress towards OT goals: Progressing toward goals  Acute Rehab OT Goals Patient Stated Goal: return home OT Goal Formulation: With patient Time For Goal Achievement: 01/12/18 Potential  to Achieve Goals: Good ADL Goals Pt Will Perform Grooming: with modified independence;standing Pt Will Perform Lower Body Bathing: with modified independence;sit to/from stand;with adaptive equipment Pt Will Perform Upper Body Dressing: with modified independence;sitting Pt Will Perform Lower Body Dressing: with modified independence;with adaptive equipment;sit to/from stand Pt Will Transfer to Toilet: with modified independence;ambulating Pt Will Perform Toileting - Clothing Manipulation and hygiene: with modified independence;with adaptive equipment;sit to/from stand Additional ADL Goal #1: Pt will perform bed mobility as precursor to participating in ADL activity at mod I level  Plan Discharge plan needs to be updated    Co-evaluation                 AM-PAC PT "6 Clicks" Daily Activity     Outcome Measure   Help from another person eating meals?: None Help from another person taking care of personal grooming?: None Help from another person toileting, which includes using toliet, bedpan, or urinal?: A Little Help from another person bathing (including washing, rinsing, drying)?: A Little Help from another person to put on and taking off regular upper body clothing?: A Little Help from another person to put on and taking off regular lower body clothing?: A Little 6 Click Score: 20    End of Session Equipment Utilized During Treatment: Rolling walker;Back brace  OT Visit Diagnosis: Unsteadiness on feet (R26.81);Other abnormalities of gait and mobility (R26.89);Pain Pain - Right/Left: Right Pain - part of body: Leg   Activity Tolerance Patient tolerated treatment well   Patient Left in bed;with call bell/phone within reach   Nurse Communication Mobility status        Time: 9509-3267 OT Time Calculation (min): 23 min  Charges: OT General Charges $OT Visit: 1 Visit OT Treatments $Self Care/Home Management : 23-37 mins  Maurie Boettcher, OT/L   Acute OT Clinical  Specialist [place] Pager [phone] Office [phone]    [facility] 01/03/2018, 4:24 PM

## 2018-01-03 NOTE — Progress Notes (Signed)
Physical Therapy Treatment Patient Details Name: Ariel Morgan MRN: [id number] DOB: 08/15/1975 Today's Date: 01/03/2018    History of Present Illness 42 y.o. female admitted on 12/28/17 for elective L4-S1 posterior decompression and fusion followed by second anterior L5-S1 interbody fusion with revision of decompression L5/S1 and removal of R L5 pedical screw on 01/01/18.  Pt with other significant PMH of anemia, and anxiety.    PT Comments    Pt moving and looking much stronger today.  She was able to mobilize a good distance into the hallway with min guard assist and RW.  She continues to have R LE weakness/foot drag, but paresthesias are better today.  Pt is declining CIR wanting to go home.  We will start stair training in the AM to ensure safe home entry.  PT will continue to follow acutely for safe mobility progression   Follow Up Recommendations  Home health PT;Supervision - Intermittent     Equipment Recommendations  Rolling walker with 5" wheels    Recommendations for Other Services   NA     Precautions / Restrictions Precautions Precautions: Back Precaution Booklet Issued: Yes (comment)(pt reports she has a handout) Precaution Comments: Reviewed no bending, lifting, twisting, posture, and putting pillows between her knees (she is a side lying sleeper) when she sleeps.  Required Braces or Orthoses: Spinal Brace Spinal Brace: Lumbar corset;Applied in sitting position(new brace with thigh component still being fitted)    Mobility  Bed Mobility               General bed mobility comments: Pt seated EOB when PT entered the room.   Transfers Overall transfer level: Needs assistance Equipment used: Rolling walker (2 wheeled) Transfers: Sit to/from Stand Sit to Stand: Min guard         General transfer comment: Min guard assist for safety during transitions.   Ambulation/Gait Ambulation/Gait assistance: Min guard Gait Distance (Feet): 120 Feet Assistive  device: Rolling walker (2 wheeled) Gait Pattern/deviations: Step-through pattern;Decreased dorsiflexion - right;Decreased stance time - right;Steppage;Decreased weight shift to right Gait velocity: decreased(improved over yesterday) Gait velocity interpretation: 1.31 - 2.62 ft/sec, indicative of limited community ambulator General Gait Details: Pt is keeping her right foot more neutral matching her left LE ER more similarly today, at least until she fatigues.  With fatigue R foot drop and R leg ER increases.  Knee stability seems to be ok with no hyperextension or buckling moment noted, but pt is also relying quite heavily on RW support during gait.  She declined wanting to put her AFO on during gait today.           Balance Overall balance assessment: Needs assistance Sitting-balance support: Feet supported;No upper extremity supported Sitting balance-Leahy Scale: Fair     Standing balance support: Bilateral upper extremity supported;No upper extremity supported;Single extremity supported Standing balance-Leahy Scale: Fair                              Cognition Arousal/Alertness: Awake/alert Behavior During Therapy: WFL for tasks assessed/performed Overall Cognitive Status: Within Functional Limits for tasks assessed                                           General Comments General comments (skin integrity, edema, etc.): Pt declined CIR, wants to go home and today's picture of her  is more appropriate for home than yesterday.       Pertinent Vitals/Pain Pain Assessment: Faces Faces Pain Scale: Hurts even more Pain Location: incisional R leg Pain Descriptors / Indicators: Grimacing;Guarding Pain Intervention(s): Limited activity within patient's tolerance;Monitored during session;Repositioned           PT Goals (current goals can now be found in the care plan section) Acute Rehab PT Goals Patient Stated Goal: "decrease pain in R leg, be able to  get back to work" Progress towards PT goals: Progressing toward goals    Frequency    Min 5X/week      PT Plan Discharge plan needs to be updated       AM-PAC PT "6 Clicks" Daily Activity  Outcome Measure  Difficulty turning over in bed (including adjusting bedclothes, sheets and blankets)?: Unable Difficulty moving from lying on back to sitting on the side of the bed? : Unable Difficulty sitting down on and standing up from a chair with arms (e.g., wheelchair, bedside commode, etc,.)?: Unable Help needed moving to and from a bed to chair (including a wheelchair)?: A Little Help needed walking in hospital room?: A Little Help needed climbing 3-5 steps with a railing? : A Little 6 Click Score: 12    End of Session Equipment Utilized During Treatment: Back brace Activity Tolerance: Patient limited by pain Patient left: in bed;Other (comment)(seated EOB to eat)   PT Visit Diagnosis: Other abnormalities of gait and mobility (R26.89);Muscle weakness (generalized) (M62.81);Difficulty in walking, not elsewhere classified (R26.2);Pain;Unsteadiness on feet (R26.81) Pain - Right/Left: Right Pain - part of body: Leg     Time: 7169-6789 PT Time Calculation (min) (ACUTE ONLY): 11 min  Charges:  $Gait Training: 8-22 mins                    Jobina Maita B. Pollyann Roa, PT, DPT  Acute Rehabilitation [phone] pager #[phone] office   01/03/2018, 2:30 PM

## 2018-01-03 NOTE — Progress Notes (Addendum)
    Subjective: Procedure(s) (LRB): POSTERIOR LUMBAR FUSION 1 WITH HARDWARE REMOVAL & CLOSURE OF POSTERIOR INCISION (N/A) ANTERIOR LUMBAR FUSION L5-S1; REPLACEMENT OF posterior HARDWARE L4-S1 (N/A) ABDOMINAL EXPOSURE (N/A) 2 Days Post-Op  Patient reports pain as 4 on 0-10 scale.  Reports decreased leg pain - improving slowly  reports incisional back pain   Positive void Negative bowel movement Positive flatus Negative chest pain or shortness of breath  Objective: Vital signs in last 24 hours: Temp:  [98.1 F (36.7 C)-102.9 F (39.4 C)] 99.1 F (37.3 C) (09/30 2356) Pulse Rate:  [80-104] 104 (09/30 2155) Resp:  [18] 18 (09/30 2155) BP: (119-125)/(67-73) 119/67 (09/30 2155) SpO2:  [97 %-98 %] 97 % (09/30 2155)  Intake/Output from previous day: 09/30 0701 - 10/01 0700 In: 1.1 [I.V.:1.1] Out: -   Labs: Recent Labs    01/01/18 0834 01/01/18 1548  WBC 11.7*  --   RBC 3.41*  --   HCT 31.4* 28.0*  PLT 309  --    Recent Labs    01/01/18 1548  NA 137  K 4.7  GLUCOSE 161*   No results for input(s): LABPT, INR in the last 72 hours.  Physical Exam: ABD soft Intact pulses distally Incision: dressing C/D/I Compartment soft Neuro exam stable.  Negative nerve root tension.  wekaness persists  There is no height or weight on file to calculate BMI.  Ambulating with less radicular leg pain   Assessment/Plan: Patient stable  xrays n/a Continue mobilization with physical therapy Continue care  Advance diet Up with therapy  1. CIR consult today 2. Continue mobilization 3. Increase lyrica back to 100 BID as this provided better neuropathic pain relief 4. Doppler is negative for DVT.  Will start lovenox qd for DVT prevention (10 day course) 5. Post-operative anemia - will start FeSO4 and monitor clinical exam  Melina Schools, MD [facility] [phone]

## 2018-01-03 NOTE — Plan of Care (Signed)
Patient progressing with plan of care. Patient able to place and remove back brace, and use walker with ambulation. Patients pain is controlled with ordered medication. Will continue to monitor and treat per MD orders.

## 2018-01-03 NOTE — Care Management Note (Signed)
Case Management Note  Patient Details  Name: Ariel Morgan MRN: [id number] Date of Birth: [birth year]  Subjective/Objective:     42 y.o. female admitted on 12/28/17 for elective L4-S1 posterior decompression and fusion followed by second anterior L5-S1 interbody fusion with revision of decompression L5/S1 and removal of R L5 pedical screw on 01/01/18.  PTA, pt independent, lives with husband and children.              Action/Plan: PT/OT Recommending CIR, and consult in process.  Will follow as pt progresses.   Expected Discharge Date:                  Expected Discharge Plan:  [place]  In-House Referral:     Discharge planning Services  CM Consult  Post Acute Care Choice:    Choice offered to:     DME Arranged:    DME Agency:     HH Arranged:    [place] Agency:     Status of Service:  In process, will continue to follow  If discussed at Long Length of Stay Meetings, dates discussed:    Additional Comments:  Ella Bodo, RN 01/03/2018, 2:30 PM

## 2018-01-03 NOTE — Progress Notes (Signed)
Inpatient Rehabilitation Admissions Coordinator  I met with patient at bedside to discuss her preference for rehab. She feels in another day or 2 that she would be doing well enough to d/c home. She does not feel she needs another week in inpt rehab. We plan to follow up tomorrow, and if she continues to progress to go home, we will sign off. I will follow up tomorrow.  Danne Baxter, RN, MSN Rehab Admissions Coordinator [phone] 01/03/2018 1:04 PM

## 2018-01-03 NOTE — Consult Note (Signed)
Physical Medicine and Rehabilitation Consult Reason for Consult: Decreased functional mobility Referring Physician: Dr. Rolena Infante   HPI: Ariel Morgan is a 42 y.o. right-handed female with pmh of GERD, headaches, anxiety, HLD admitted 12/28/2017 with progressive low back pain and limited mobility.  Per chart review and patient, patient lives with spouse and 3 children ages 38-13 and 79.  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  2 level home with bed and bath on main level.  3 steps to entry.  X-rays and imaging revealed L5-S1 spondylolisthesis with significant foraminal stenosis and L5 nerve compression.  Underwent L4-S1 decompression and fusion 12/28/2017 per Dr. Rolena Infante.  Postoperatively patient with new right radicular leg pain and right foot drop.  Imaging studies demonstrate ongoing foraminal stenosis.  Underwent anterior lumbar interbody fusion L5-S1.  Revision decompression L5/S1.  Removal of right L5 pedicle screw 01/01/2018.  Lumbar corset applied in sitting position.  Vascular study lower extremities negative.  Placed on subcutaneous Lovenox for DVT prophylaxis.  Acute blood loss anemia 10.3.  Therapy evaluations completed with recommendations of physical medicine rehab consult.   Review of Systems  Constitutional: Negative for chills and fever.  HENT: Negative for hearing loss.   Eyes: Negative for blurred vision and double vision.  Respiratory: Negative for cough and shortness of breath.   Cardiovascular: Negative for chest pain, palpitations and leg swelling.  Gastrointestinal: Positive for constipation. Negative for nausea and vomiting.       GERD  Genitourinary: Negative for dysuria, flank pain and hematuria.  Musculoskeletal: Positive for back pain and myalgias.  Skin: Negative for rash.  Neurological: Positive for headaches.  Psychiatric/Behavioral:       Anxiety  All other systems reviewed and are negative.  Past Medical History:    Diagnosis Date  . Anemia   . Anxiety   . Arthritis   . Frequent headaches    H/O  . GERD (gastroesophageal reflux disease)   . Hypercholesterolemia   . Placenta previa    Past Surgical History:  Procedure Laterality Date  . BREAST BIOPSY Right 05/10/2017   right breast stereotatic bx path pending  . CESAREAN SECTION  2003 & 2010  . CHOLECYSTECTOMY  2005  . LUMBAR LAMINECTOMY/DECOMPRESSION MICRODISCECTOMY N/A 12/28/2017   Procedure: L4-S1 decompression and fusion;  Surgeon: Melina Schools, MD;  Location: [place];  Service: Orthopedics;  Laterality: N/A;  4.5 hrs   Family History  Problem Relation Age of Onset  . Hyperlipidemia Father   . Diabetes Sister   . Diabetes Maternal Grandmother   . Cancer Maternal Grandmother        vaginal  . Cirrhosis Maternal Grandmother   . Colon polyps Maternal Grandmother   . Breast cancer Maternal Grandmother        Early 60's   Social History:  reports that she has quit smoking. Her smoking use included cigarettes. She smoked 1.00 pack per day. She has never used smokeless tobacco. She reports that she drinks alcohol. She reports that she does not use drugs. Allergies: No Known Allergies Medications Prior to Admission  Medication Sig Dispense Refill  . escitalopram (LEXAPRO) 20 MG tablet TAKE 1 TABLET BY MOUTH EVERY DAY 90 tablet 1  . pregabalin (LYRICA) 75 MG capsule TAKE ONE CAPSULE BY MOUTH TWICE A DAY AS NEEDED 60 capsule 1  . tiZANidine (ZANAFLEX) 4 MG tablet TAKE 1 TABLET (4 MG TOTAL) BY MOUTH AT BEDTIME AS NEEDED FOR MUSCLE SPASMS. 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 Functional Status:  Mobility: Bed Mobility Overal bed mobility: Modified Independent Bed Mobility: Rolling, Sidelying to Sit, Sit to Sidelying Rolling: Min guard Sidelying to sit: Min guard Sit to sidelying: Min assist General bed mobility comments: Min Guard A for safety during log roll. Min A for bringing BLEs over EOB. Pt requring significant amount of time Transfers Overall transfer level: Needs assistance Equipment used: Rolling walker (2 wheeled) Transfers: Sit to/from Stand Sit to Stand: Min assist General transfer comment: Min A for power up into standing and safe descent ot [facility] Ambulation/Gait Ambulation/Gait assistance: Min assist Gait Distance (Feet): 55 Feet Assistive device: Rolling walker (2 wheeled) Gait Pattern/deviations: Step-through pattern, Decreased weight shift to right, Decreased dorsiflexion - right, Decreased stance time - right General Gait Details: Pt continues to externally rotate her right foot more than her left foot, she does have some toe flexor and some weak DF movement in her right foot today and since she was already up we did not donn her AFO for gait.  She has her old lumbar corsett donned and reports the brace company came and measured her for the new brace with the thigh component.  Gait velocity: decreased Gait velocity interpretation: <1.8 ft/sec, indicate of risk for recurrent falls    ADL: ADL Overall ADL's : Needs assistance/impaired Eating/Feeding: Independent Grooming: Standing, Wash/dry hands, Min guard Grooming Details (indicate cue type and reason): Min Guard A for safety Upper Body Bathing: Sitting, Set up, With adaptive equipment Lower Body Bathing: Moderate assistance, With caregiver  independent assisting, Sitting/lateral leans Upper Body Dressing : Minimal assistance, Sitting, With caregiver independent assisting Upper Body Dressing Details (indicate cue type and reason): donning second gown like a gown with Min A from husband. Donning bra with assistance from husband Lower Body Dressing: Maximal assistance, Sit to/from stand Lower Body Dressing Details (indicate cue type and reason): Requiring Max A for LB dressing to don socks and shoes (and AFO). Pt's husband insisting on assisting pt.  Toilet Transfer: Ambulation, Min guard, BSC, RW Toilet Transfer Details (indicate cue type and reason): Min Guard A for safety Toileting- Water quality scientist and Hygiene: Min guard, Sit to/from stand Toileting - Clothing Manipulation Details (indicate cue type and reason): Cues for bending at knees for peri care Tub/ Banker: [facility], Min guard, Ambulation, Rolling walker, Shower seat Functional mobility during ADLs: Rolling walker, Min guard, Cueing for sequencing, Cueing for safety(R foot drop) General ADL Comments: Pt requiring increased assistance s/p second surgery.   Cognition: Cognition Overall Cognitive Status: Within Functional Limits for tasks assessed Orientation Level: Oriented X4 Cognition Arousal/Alertness: Awake/alert Behavior During Therapy: WFL for tasks assessed/performed Overall Cognitive Status: Within Functional Limits for tasks assessed General Comments: Pt concerned about dc home s/p second surgery  Blood pressure 119/67, pulse (!) 104, temperature 99.1 F (37.3 C), temperature source Oral, resp. rate 18, SpO2 97 %. Physical Exam  Vitals reviewed. Constitutional: She is oriented to person, place, and time. She appears well-developed and well-nourished.  HENT:  Head: Normocephalic and atraumatic.  Eyes: EOM are normal. Right eye exhibits no discharge. Left eye exhibits no discharge.  Neck: Normal range of motion. Neck supple. No thyromegaly  present.  Cardiovascular: Regular rhythm and normal heart sounds.  +Tachycardia  Respiratory: Effort normal and breath sounds normal. No respiratory distress.  GI: Soft. Bowel sounds are normal. She exhibits no distension.  Musculoskeletal:  No edema or tenderness in extremities  Neurological: She is alert and oriented to person, place, and time.  Motor: B/l UE 4+/5 proximal to distal RLE: HF, KE 3+/5, ADF 4/5 LLE: HF, KE 4/5, ADF 4+/5 Sensation subjectively diminished to light touch RLE  Skin:  Back incision is dressed  Psychiatric: She has a normal mood and affect. Her behavior is normal. Thought content normal.    No results found for this or any previous visit (from the past 24 hour(s)). Dg Lumbar Spine 2-3 Views  Result Date: 01/01/2018 CLINICAL DATA:  Lumbar fusion EXAM: DG C-ARM 61-120 MIN; LUMBAR SPINE - 2-3 VIEW COMPARISON:  Intraoperative radiographs dated 12/28/2017 FLUOROSCOPY TIME:  2 minutes 27 seconds FINDINGS: Removal of pedicle screws at L5. Placement of L5-S1 interbody spacer. Anterior spinal fusion hardware at L5-S1. L4-S1 posterior spinal fusion hardware, in satisfactory position. IMPRESSION: Intraoperative radiograph during L4-S1 fusion, as above. Electronically Signed   By: Julian Hy M.D.   On: 01/01/2018 21:44   Dg C-arm 1-60 Min  Result Date: 01/01/2018 CLINICAL DATA:  Lumbar fusion EXAM: DG C-ARM 61-120 MIN; LUMBAR SPINE - 2-3 VIEW COMPARISON:  Intraoperative radiographs dated 12/28/2017 FLUOROSCOPY TIME:  2 minutes 27 seconds FINDINGS: Removal of pedicle screws at L5. Placement of L5-S1 interbody spacer. Anterior spinal fusion hardware at L5-S1. L4-S1 posterior spinal fusion hardware, in satisfactory position. IMPRESSION: Intraoperative radiograph during L4-S1 fusion, as above. Electronically Signed   By: Julian Hy M.D.   On: 01/01/2018 21:44   Dg C-arm 1-60 Min  Result Date: 01/01/2018 CLINICAL DATA:  Lumbar fusion EXAM: DG C-ARM 61-120 MIN;  LUMBAR SPINE - 2-3 VIEW COMPARISON:  Intraoperative radiographs dated 12/28/2017 FLUOROSCOPY TIME:  2 minutes 27 seconds FINDINGS: Removal of pedicle screws at L5. Placement of L5-S1 interbody spacer. Anterior spinal fusion hardware at L5-S1. L4-S1 posterior spinal fusion hardware, in satisfactory position. IMPRESSION: Intraoperative radiograph during L4-S1 fusion, as above. Electronically Signed   By: Julian Hy M.D.   On: 01/01/2018 21:44   Dg C-arm 1-60 Min  Result Date: 01/01/2018 CLINICAL DATA:  Lumbar fusion EXAM: DG C-ARM 61-120 MIN; LUMBAR SPINE - 2-3 VIEW COMPARISON:  Intraoperative radiographs dated 12/28/2017 FLUOROSCOPY TIME:  2 minutes 27 seconds FINDINGS: Removal of pedicle screws at L5. Placement of L5-S1 interbody spacer. Anterior spinal fusion hardware at L5-S1. L4-S1 posterior spinal fusion hardware, in satisfactory position. IMPRESSION: Intraoperative radiograph during L4-S1 fusion, as above. Electronically Signed   By: Julian Hy M.D.   On: 01/01/2018 21:44   Dg Or Local Abdomen  Result Date: 01/01/2018 CLINICAL DATA:  OR for EXAM: OR LOCAL ABDOMEN COMPARISON:  CT scan of December 29, 2017. FINDINGS: The bowel gas pattern is normal. Status post bilateral intrapedicular screw placement of L4 and L5. Midline surgical staples are noted. Surgical clip is noted in the left side of the pelvis. Surgical clips are seen projected over the sacrum. No other foreign body is noted. IMPRESSION: Postsurgical changes are seen involving the lower lumbar spine. Surgical clips are noted in this area. No other foreign body is noted. These results were called by telephone  at the time of interpretation on 01/01/2018 at 3:18 pm to [facility] in OR 4, who verbally acknowledged these results. Electronically Signed   By: Marijo Conception, M.D.   On: 01/01/2018 15:19    Assessment/Plan: Diagnosis: Foraminal stenosis s/p decompression Labs independently reviewed.  Records reviewed and summated  above.  1. Does the need for close, 24 hr/day medical supervision in concert with the patient's rehab needs make it unreasonable for this patient to be served in a less intensive setting? Potentially  2. Co-Morbidities requiring supervision/potential complications: GERD (cont meds), headaches (ensure pain does not limit functional progress), anxiety (ensure anxiety and resulting apprehension do not limit functional progress; consider prn medications if warranted), HLD, Acute blood loss anemia (transfuse if necessary to ensure appropriate perfusion for increased activity tolerance), post-op pain (Biofeedback training with therapies to help reduce reliance on opiate pain medications, monitor pain control during therapies, and sedation at rest and titrate to maximum efficacy to ensure participation and gains in therapies), FUO (cont to monitor for signs and symptoms of infection, further workup if indicated) 3. Due to safety, skin/wound care, disease management, pain management, patient education and fevers, does the patient require 24 hr/day rehab nursing? Yes 4. Does the patient require coordinated care of a physician, rehab nurse, PT (1-2 hrs/day, 5 days/week) and OT (1-2 hrs/day, 5 days/week) to address physical and functional deficits in the context of the above medical diagnosis(es)? Potentially Addressing deficits in the following areas: balance, endurance, locomotion, strength, transferring, bathing, dressing, toileting and psychosocial support 5. Can the patient actively participate in an intensive therapy program of at least 3 hrs of therapy per day at least 5 days per week? Yes 6. The potential for patient to make measurable gains while on inpatient rehab is excellent 7. Anticipated functional outcomes upon discharge from inpatient rehab are modified independent  with PT, modified independent with OT, n/a with SLP. 8. Estimated rehab length of stay to reach the above functional goals is: 6-9  days. 9. Anticipated D/C setting: Home 10. Anticipated post D/C treatments: Outpatient therapy and Home excercise program 11. Overall Rehab/Functional Prognosis: excellent  RECOMMENDATIONS: This patient's condition is appropriate for continued rehabilitative care in the following setting: Short CIR stay to optimize independence prior to discharge once medically stable. Patient has agreed to participate in recommended program. Yes Note that insurance prior authorization may be required for reimbursement for recommended care.  Comment: Rehab Admissions Coordinator to follow up.   I have personally performed a face to face diagnostic evaluation, including, but not limited to relevant history and physical exam findings, of this patient and developed relevant assessment and plan.  Additionally, I have reviewed and concur with the physician assistant's documentation above.   Delice Lesch, MD, ABPMR Lavon Paganini Angiulli, PA-C 01/03/2018

## 2018-01-04 MED ORDER — MAGNESIUM CITRATE PO SOLN
1.0000 | Freq: Once | ORAL | Status: AC
  Administered 2018-01-04: 1 via ORAL
  Filled 2018-01-04: qty 296

## 2018-01-04 MED ORDER — PREGABALIN 100 MG PO CAPS: 100.0000 mg | ORAL_CAPSULE | Freq: Two times a day (BID) | ORAL | 0 refills | Status: DC

## 2018-01-04 NOTE — Progress Notes (Signed)
Occupational Therapy Treatment Patient Details Name: Ariel Morgan MRN: [id number] DOB: [birth year] Today's Date: 01/04/2018    History of present illness 42 y.o. female admitted on 12/28/17 for elective L4-S1 posterior decompression and fusion followed by second anterior L5-S1 interbody fusion with revision of decompression L5/S1 and removal of R L5 pedical screw on 01/01/18.  Pt with other significant PMH of anemia, and anxiety.   OT comments  Making excellent progress. Pt able to complete ADL at level safe to DC home when medically stable. conitnue to recommend [place] for follow up. Pt states her husband is planning on taking off a couple of days and her in-laws will be able to help as well.   Follow Up Recommendations  Home health OT;Supervision - Intermittent    Equipment Recommendations  3 in 1 bedside commode    Recommendations for Other Services      Precautions / Restrictions Precautions Precautions: Back Required Braces or Orthoses: Spinal Brace Spinal Brace: Thoracolumbosacral orthotic(with leg extender)       Mobility Bed Mobility Overal bed mobility: Modified Independent                Transfers Overall transfer level: Needs assistance Equipment used: Rolling walker (2 wheeled)             General transfer comment: cuing to unlock brace    Balance Overall balance assessment: Needs assistance   Sitting balance-Leahy Scale: Good       Standing balance-Leahy Scale: Fair                             ADL either performed or assessed with clinical judgement   ADL Overall ADL's : Needs assistance/impaired     Grooming: Set up;Standing;Supervision/safety   Upper Body Bathing: Set up;Supervision/ safety;Sitting   Lower Body Bathing: Supervison/ safety;With adaptive equipment;Minimal assistance;Sit to/from stand   Upper Body Dressing : Set up;Supervision/safety;Sitting   Lower Body Dressing: Minimal assistance Lower Body Dressing  Details (indicate cue type and reason): issued elastic shoesrrings and extra long shoe horn Toilet Transfer: Supervision/safety;RW;BSC(over toilet)   Toileting- Clothing Manipulation and Hygiene: Supervision/safety;Sit to/from stand;With adaptive equipment       Functional mobility during ADLs: Supervision/safety;Rolling walker General ADL Comments: Focus of session on using compensatory techqnieus adn AE/DME to increase indepednence with ADL. Educated pt on managemetn of brace during toileting/hygiene. Pt able to return demosntrte; discussed home safety and set up wtih increase independence at home.      Vision       Perception     Praxis      Cognition Arousal/Alertness: Awake/alert Behavior During Therapy: WFL for tasks assessed/performed Overall Cognitive Status: Within Functional Limits for tasks assessed                                          Exercises     Shoulder Instructions       General Comments      Pertinent Vitals/ Pain       Pain Assessment: Faces Faces Pain Scale: Hurts little more Pain Location: incisional R leg Pain Descriptors / Indicators: Discomfort Pain Intervention(s): Limited activity within patient's tolerance;Patient requesting pain meds-RN notified  Home Living  Prior Functioning/Environment              Frequency  Min 3X/week        Progress Toward Goals  OT Goals(current goals can now be found in the care plan section)  Progress towards OT goals: Progressing toward goals  Acute Rehab OT Goals Patient Stated Goal: return home OT Goal Formulation: With patient Time For Goal Achievement: 01/12/18 Potential to Achieve Goals: Good ADL Goals Pt Will Perform Grooming: with modified independence;standing Pt Will Perform Lower Body Bathing: with modified independence;sit to/from stand;with adaptive equipment Pt Will Perform Upper Body Dressing: with  modified independence;sitting Pt Will Perform Lower Body Dressing: with modified independence;with adaptive equipment;sit to/from stand Pt Will Transfer to Toilet: with modified independence;ambulating Pt Will Perform Toileting - Clothing Manipulation and hygiene: with modified independence;with adaptive equipment;sit to/from stand Additional ADL Goal #1: Pt will perform bed mobility as precursor to participating in ADL activity at mod I level  Plan Discharge plan needs to be updated    Co-evaluation                 AM-PAC PT "6 Clicks" Daily Activity     Outcome Measure   Help from another person eating meals?: None Help from another person taking care of personal grooming?: None Help from another person toileting, which includes using toliet, bedpan, or urinal?: A Little Help from another person bathing (including washing, rinsing, drying)?: A Little Help from another person to put on and taking off regular upper body clothing?: None Help from another person to put on and taking off regular lower body clothing?: A Little 6 Click Score: 21    End of Session Equipment Utilized During Treatment: Rolling walker;Gait belt;Back brace  OT Visit Diagnosis: Unsteadiness on feet (R26.81);Other abnormalities of gait and mobility (R26.89);Pain Pain - Right/Left: Right Pain - part of body: Leg   Activity Tolerance Patient tolerated treatment well   Patient Left in chair;with call bell/phone within reach   Nurse Communication Mobility status        Time: 0910-0950 OT Time Calculation (min): 40 min  Charges: OT General Charges $OT Visit: 1 Visit OT Treatments $Self Care/Home Management : 38-52 mins  Maurie Boettcher, OT/L   Acute OT Clinical Specialist [place] Pager [phone] Office [phone]    [facility] 01/04/2018, 10:47 AM

## 2018-01-04 NOTE — Progress Notes (Signed)
Physical Therapy Treatment Patient Details Name: Ariel Morgan MRN: [id number] DOB: 12/31/1975 Today's Date: 01/04/2018    History of Present Illness 42 y.o. female admitted on 12/28/17 for elective L4-S1 posterior decompression and fusion followed by second anterior L5-S1 interbody fusion with revision of decompression L5/S1 and removal of R L5 pedical screw on 01/01/18.  Pt with other significant PMH of anemia, and anxiety.    PT Comments    Pt is moving well, able to preform stair training simulating home entry, but otherwise supervision for all of her mobility.  Some minimal assist given with brace and shoes, but otherwise, pt is progressing nicely and remains appropriate for d/c home when MD deems appropriate.    Follow Up Recommendations  Home health PT;Supervision - Intermittent     Equipment Recommendations  Rolling walker with 5" wheels    Recommendations for Other Services   NA     Precautions / Restrictions Precautions Precautions: Back Precaution Comments: Reviewed no bending, lifting, twisting, posture, and putting pillows between her knees (she is a side lying sleeper) when she sleeps.  Required Braces or Orthoses: Spinal Brace Spinal Brace: Thoracolumbosacral orthotic(with thigh component)    Mobility  Bed Mobility Overal bed mobility: Modified Independent             General bed mobility comments: Pt able to roll and get to sitting from side lying unassisted  Transfers Overall transfer level: Needs assistance Equipment used: Rolling walker (2 wheeled) Transfers: Sit to/from Stand Sit to Stand: Supervision         General transfer comment: supervision for safety  Ambulation/Gait Ambulation/Gait assistance: Supervision Gait Distance (Feet): 140 Feet Assistive device: Rolling walker (2 wheeled) Gait Pattern/deviations: Step-through pattern;Decreased dorsiflexion - right;Decreased stance time - right;Steppage;Decreased weight shift to right      General Gait Details: Pt's right foot is clearing better and better, left leg is locked into extension in brace during gait.    Stairs Stairs: Yes Stairs assistance: Min assist Stair Management: One rail Left;Step to pattern;Forwards Number of Stairs: 3(x2) General stair comments: Pt able to ascend and descend the stairs with min hand held assist, husband to be present to assist as therapist did today.  Practiced steps x 2 as her thigh componenet was not fully locked the first time.  Reinfoced she has to lead with her unlocked leg and descend with her locked leg.           Balance Overall balance assessment: Needs assistance Sitting-balance support: Feet supported;No upper extremity supported Sitting balance-Leahy Scale: Good     Standing balance support: Bilateral upper extremity supported;No upper extremity supported;Single extremity supported Standing balance-Leahy Scale: Fair                              Cognition Arousal/Alertness: Awake/alert Behavior During Therapy: WFL for tasks assessed/performed Overall Cognitive Status: Within Functional Limits for tasks assessed                                               Pertinent Vitals/Pain Pain Assessment: Faces Faces Pain Scale: Hurts little more Pain Location: incisional R leg Pain Descriptors / Indicators: Discomfort Pain Intervention(s): Limited activity within patient's tolerance;Monitored during session;Repositioned           PT Goals (current goals can now be found  in the care plan section) Acute Rehab PT Goals Patient Stated Goal: return home Progress towards PT goals: Progressing toward goals    Frequency    Min 5X/week      PT Plan Current plan remains appropriate       AM-PAC PT "6 Clicks" Daily Activity  Outcome Measure  Difficulty turning over in bed (including adjusting bedclothes, sheets and blankets)?: A Little Difficulty moving from lying on back to  sitting on the side of the bed? : A Little Difficulty sitting down on and standing up from a chair with arms (e.g., wheelchair, bedside commode, etc,.)?: None Help needed moving to and from a bed to chair (including a wheelchair)?: None Help needed walking in hospital room?: None Help needed climbing 3-5 steps with a railing? : A Little 6 Click Score: 21    End of Session Equipment Utilized During Treatment: Back brace Activity Tolerance: Patient limited by pain Patient left: in bed;with call bell/phone within reach   PT Visit Diagnosis: Other abnormalities of gait and mobility (R26.89);Muscle weakness (generalized) (M62.81);Difficulty in walking, not elsewhere classified (R26.2);Pain;Unsteadiness on feet (R26.81) Pain - Right/Left: Right Pain - part of body: Leg     Time: 3817-7116 PT Time Calculation (min) (ACUTE ONLY): 20 min  Charges:  $Gait Training: 8-22 mins          Garin Mata B. Angus Amini, PT, DPT  Acute Rehabilitation [phone] pager #[phone] office            01/04/2018, 4:48 PM

## 2018-01-04 NOTE — Progress Notes (Signed)
Patient continues ambulation in room/hallway, doing well, able to don/doff brace on own.  Therapy recommending home health.  Patient feels ready for discharge.  Continue to monitor pain.

## 2018-01-04 NOTE — Progress Notes (Signed)
Patient given d/c instructions including printed prescriptions, questions answered.  IV removed.  Equipment delivered.  Patient taken to car via wheelchair with all belongings.

## 2018-01-04 NOTE — Progress Notes (Signed)
Inpatient Rehabilitation Admissions Coordinator  Noted progress with therapy and now is appropriate for [facility]. I have notified RN cm and will sign off at this time.  Danne Baxter, RN, MSN Rehab Admissions Coordinator [phone] 01/04/2018 1:29 PM

## 2018-01-04 NOTE — Progress Notes (Signed)
    Subjective: 3 Days Post-Op Procedure(s) (LRB): POSTERIOR LUMBAR FUSION 1 WITH HARDWARE REMOVAL & CLOSURE OF POSTERIOR INCISION (N/A) ANTERIOR LUMBAR FUSION L5-S1; REPLACEMENT OF posterior HARDWARE L4-S1 (N/A) ABDOMINAL EXPOSURE (N/A) Patient reports pain as 3 on 0-10 scale.   Denies CP or SOB.  Voiding without difficulty. Positive flatus. Pt ambulating utilizing a rolling walker.  Pt has not had BM Objective: Vital signs in last 24 hours: Temp:  [97.9 F (36.6 C)-99 F (37.2 C)] 98.1 F (36.7 C) (10/02 1144) Pulse Rate:  [88-99] 88 (10/02 1144) Resp:  [16] 16 (10/02 0745) BP: (104-117)/(53-66) 114/66 (10/02 1144) SpO2:  [93 %-99 %] 96 % (10/02 1144)  Intake/Output from previous day: 10/01 0701 - 10/02 0700 In: 2.5 [I.V.:2.5] Out: -  Intake/Output this shift: Total I/O In: 240 [P.O.:240] Out: -   Labs: Recent Labs    01/01/18 1548  HGB 9.5*   Recent Labs    01/01/18 1548  HCT 28.0*   Recent Labs    01/01/18 1548  NA 137  K 4.7  GLUCOSE 161*   No results for input(s): LABPT, INR in the last 72 hours.  Physical Exam: Neurologically intact ABD soft Sensation intact distally Dorsiflexion/Plantar flexion intact Incision: scant drainage Compartment soft There is no height or weight on file to calculate BMI.   Assessment/Plan: 3 Days Post-Op Procedure(s) (LRB): POSTERIOR LUMBAR FUSION 1 WITH HARDWARE REMOVAL & CLOSURE OF POSTERIOR INCISION (N/A) ANTERIOR LUMBAR FUSION L5-S1; REPLACEMENT OF posterior HARDWARE L4-S1 (N/A) ABDOMINAL EXPOSURE (N/A) Home health ordered per PT/OT 3-1 and rolling walker ordered per case manager Meds on chart Hopeful pt can DC home after she is cleared by Dr. Jalene Mullet, Darla Lesches for Dr. Melina Schools [facility] [phone] 01/04/2018, 2:06 PM

## 2018-01-05 ENCOUNTER — Telehealth: Payer: Self-pay | Admitting: Internal Medicine

## 2018-01-05 NOTE — Telephone Encounter (Signed)
Copied from [place] [phone]. Topic: General - Other >> Jan 05, 2018  3:30 PM Margot Ables wrote: Reason for CRM: Pt seen for [facility] admission today - requesting VO for PT visits 2x week for 4 weeks starting next week. Medication interaction review shows ondansetron reacting with escitalopram which may cause cardiac erythema. Please advise. Secure VM if no answer.

## 2018-01-05 NOTE — Telephone Encounter (Signed)
See medication interaction concern Zofran and Lexapro. PT wanting to know benefit out way risk?

## 2018-01-05 NOTE — Telephone Encounter (Signed)
If has to take something for nausea, would try to limit the amount of zofran.  Would try to avoid if possible.

## 2018-01-06 NOTE — Telephone Encounter (Signed)
Called PT Hoyle Sauer and advised of MD advice and gave verbal for PT

## 2018-01-10 NOTE — Discharge Summary (Signed)
Physician Discharge Summary  Patient ID: Ariel Morgan MRN: [id number] DOB/AGE: [birth year] 42 y.o.  Admit date: 12/28/2017 Discharge date: 01/04/18 Admission Diagnoses:  Lumbar spinal stenosis  Discharge Diagnoses:  Active Problems:   S/P lumbar fusion   Surgery, elective   Hyperlipidemia   Anxiety state   Acute blood loss anemia   Post-operative pain   FUO (fever of unknown origin)   Past Medical History:  Diagnosis Date  . Anemia   . Anxiety   . Arthritis   . Frequent headaches    H/O  . GERD (gastroesophageal reflux disease)   . Hypercholesterolemia   . Placenta previa     Surgeries: Procedure(s): POSTERIOR LUMBAR FUSION 1 WITH HARDWARE REMOVAL & CLOSURE OF POSTERIOR INCISION ANTERIOR LUMBAR FUSION L5-S1; REPLACEMENT OF posterior HARDWARE L4-S1 ABDOMINAL EXPOSURE on 01/01/2018   Consultants (if any):   Discharged Condition: Improved  Hospital Course: Ariel Morgan is an 42 y.o. female who was admitted 12/28/2017 with a diagnosis of Lumbar spinal stenosis and went to the operating room on 01/01/2018 and underwent the above named procedures.  9/25 pt had L40S1 decompression and posterior fusion.  Post op day one pt reported Right radicular leg pain and incisional pain. RLE weakness was also noted.   Pt was voiding w/o difficulty. IV steroid was given.  CT scan ordered.  Deficits were noted by PT in RLE. Post op day 2 pt still reported RLE pain that increases with ambulation.  AFO ordered.  Dr Rolena Infante reviewed the [place] with pt.  9/28 Consult done by Dr. Phylliss Bob.  Decision is made by Dr. Rolena Infante to proceed with ALIF. Dr. Carlis Abbott Consults and agrees to assist with anterior approach of the ALIF. 9/29 ALIF procedure performed. Post op day one pt reports decreased leg pain and moderate incisional pain. Pt is voiding w/o difficulty. Doppler negative for DVT.  Started Iron as post op anemia noted.  Lovenox started.  Increased lyrica.  Inpatient rehab considered but pt was doing  well and cleared to go home.  Cleared by PT/OT  She was given perioperative antibiotics:  Anti-infectives (From admission, onward)   Start     Dose/Rate Route Frequency Ordered Stop   01/01/18 2200  ceFAZolin (ANCEF) IVPB 2g/100 mL premix     2 g 200 mL/hr over 30 Minutes Intravenous Every 8 hours 01/01/18 1831 01/02/18 0603   01/01/18 0845  ceFAZolin (ANCEF) IVPB 2g/100 mL premix  Status:  Discontinued    Note to Pharmacy:  On call to O.R.   2 g 200 mL/hr over 30 Minutes Intravenous To Surgery 01/01/18 0836 01/01/18 0851   01/01/18 0500  ceFAZolin (ANCEF) IVPB 2g/100 mL premix     2 g 200 mL/hr over 30 Minutes Intravenous To Surgery 12/31/17 1735 01/01/18 1358   01/01/18 0000  ceFAZolin (ANCEF) powder 2 g  Status:  Discontinued     2 g Other To Surgery 12/31/17 1733 12/31/17 1735   12/28/17 2000  ceFAZolin (ANCEF) IVPB 2g/100 mL premix     2 g 200 mL/hr over 30 Minutes Intravenous Every 8 hours 12/28/17 1859 12/29/17 0342   12/28/17 0550  ceFAZolin (ANCEF) IVPB 2g/100 mL premix     2 g 200 mL/hr over 30 Minutes Intravenous 30 min pre-op 12/28/17 0550 12/28/17 1200    .  She was given sequential compression devices, early ambulation, and TED for DVT prophylaxis.  She benefited maximally from the hospital stay and there were no complications.    Recent vital  signs:  Vitals:   01/04/18 1144 01/04/18 1617  BP: 114/66 (!) 121/47  Pulse: 88 93  Resp:    Temp: 98.1 F (36.7 C) 98.1 F (36.7 C)  SpO2: 96% 100%    Recent laboratory studies:  Lab Results  Component Value Date   HGB 9.5 (L) 01/01/2018   HGB 10.3 (L) 01/01/2018   HGB 13.5 12/19/2017   Lab Results  Component Value Date   WBC 11.7 (H) 01/01/2018   PLT 309 01/01/2018   No results found for: INR Lab Results  Component Value Date   NA 137 01/01/2018   K 4.7 01/01/2018   CL 105 12/19/2017   CO2 23 12/19/2017   BUN 9 12/19/2017   CREATININE 0.83 12/19/2017   GLUCOSE 161 (H) 01/01/2018    Discharge  Medications:   Allergies as of 01/04/2018   No Known Allergies     Medication List    STOP taking these medications   tiZANidine 4 MG tablet Commonly known as:  ZANAFLEX     TAKE these medications   escitalopram 20 MG tablet Commonly known as:  LEXAPRO TAKE 1 TABLET BY MOUTH EVERY DAY   methocarbamol 500 MG tablet Commonly known as:  ROBAXIN Take 1 tablet (500 mg total) by mouth 3 (three) times daily.   ondansetron 4 MG disintegrating tablet Commonly known as:  ZOFRAN-ODT Take 1 tablet (4 mg total) by mouth every 8 (eight) hours as needed.   oxyCODONE-acetaminophen 10-325 MG tablet Commonly known as:  PERCOCET Take 1 tablet by mouth every 6 (six) hours as needed for pain.   pregabalin 100 MG capsule Commonly known as:  LYRICA Take 1 capsule (100 mg total) by mouth 2 (two) times daily. What changed:    medication strength  how much to take  how to take this  when to take this  additional instructions       Diagnostic Studies: Dg Lumbar Spine 2-3 Views  Result Date: 01/01/2018 CLINICAL DATA:  Lumbar fusion EXAM: DG C-ARM 61-120 MIN; LUMBAR SPINE - 2-3 VIEW COMPARISON:  Intraoperative radiographs dated 12/28/2017 FLUOROSCOPY TIME:  2 minutes 27 seconds FINDINGS: Removal of pedicle screws at L5. Placement of L5-S1 interbody spacer. Anterior spinal fusion hardware at L5-S1. L4-S1 posterior spinal fusion hardware, in satisfactory position. IMPRESSION: Intraoperative radiograph during L4-S1 fusion, as above. Electronically Signed   By: Julian Hy M.D.   On: 01/01/2018 21:44   Dg Lumbar Spine 2-3 Views  Result Date: 12/28/2017 CLINICAL DATA:  L4-5, L5-S1 fusion EXAM: LUMBAR SPINE - 2-3 VIEW; DG C-ARM 61-120 MIN COMPARISON:  None. FLUOROSCOPY TIME:  Radiation Exposure Index (as provided by the fluoroscopic device): Not available If the device does not provide the exposure index: Fluoroscopy Time:  6 minutes 36 seconds Number of Acquired Images:  3 FINDINGS: Pedicle  screws are noted at L4, L5 and S1 with posterior fixation rods. Wide laminectomy is noted as well. IMPRESSION: L4-5, L5-S1 fusion. Electronically Signed   By: Inez Catalina M.D.   On: 12/28/2017 15:46   Ct Lumbar Spine Wo Contrast  Result Date: 12/29/2017 CLINICAL DATA:  Right lower extremity pain and weakness. Prior lumbar fusion. EXAM: CT LUMBAR SPINE WITHOUT CONTRAST TECHNIQUE: Multidetector CT imaging of the lumbar spine was performed without intravenous contrast administration. Multiplanar CT image reconstructions were also generated. COMPARISON:  Lumbar spine radiograph 12/28/2017 FINDINGS: Segmentation: Normal Alignment: Grade 3 anterolisthesis at L5-S1 Vertebrae: There is posterior spinal fusion hardware at L4-S1 with right transpedicular screws at all levels  and left transpedicular screws at L4 and S1. There is no abnormal perihardware lucency. There is not solid osseous fusion. No compression fracture. Paraspinal and other soft tissues: Negative. Disc levels: The levels above L3-4 normal. There is a small amount of gas in the epidural space at the L3-4 level, which is otherwise normal. L4-L5: Posterior decompression and postfusion changes with widely patent spinal canal. There is postoperative gas extending into both neural foramina, larger on the left than on the right. L5-S1: There is bilateral severe neural foraminal stenosis at the fused L5-S1 levels with greater impingement on the right relative to the left. There is grade 3 anterolisthesis. The spinal canal has been decompressed posteriorly. IMPRESSION: 1. Postoperative changes a recent L4-S1 posterior spinal fusion. 2. Unchanged grade 3 L5-S1 anterolisthesis, resulting in severe bilateral neural foraminal stenosis with right-greater-than-left neural impingement. Electronically Signed   By: Ulyses Jarred M.D.   On: 12/29/2017 20:39   Dg C-arm 1-60 Min  Result Date: 01/01/2018 CLINICAL DATA:  Lumbar fusion EXAM: DG C-ARM 61-120 MIN; LUMBAR  SPINE - 2-3 VIEW COMPARISON:  Intraoperative radiographs dated 12/28/2017 FLUOROSCOPY TIME:  2 minutes 27 seconds FINDINGS: Removal of pedicle screws at L5. Placement of L5-S1 interbody spacer. Anterior spinal fusion hardware at L5-S1. L4-S1 posterior spinal fusion hardware, in satisfactory position. IMPRESSION: Intraoperative radiograph during L4-S1 fusion, as above. Electronically Signed   By: Julian Hy M.D.   On: 01/01/2018 21:44   Dg C-arm 1-60 Min  Result Date: 01/01/2018 CLINICAL DATA:  Lumbar fusion EXAM: DG C-ARM 61-120 MIN; LUMBAR SPINE - 2-3 VIEW COMPARISON:  Intraoperative radiographs dated 12/28/2017 FLUOROSCOPY TIME:  2 minutes 27 seconds FINDINGS: Removal of pedicle screws at L5. Placement of L5-S1 interbody spacer. Anterior spinal fusion hardware at L5-S1. L4-S1 posterior spinal fusion hardware, in satisfactory position. IMPRESSION: Intraoperative radiograph during L4-S1 fusion, as above. Electronically Signed   By: Julian Hy M.D.   On: 01/01/2018 21:44   Dg C-arm 1-60 Min  Result Date: 01/01/2018 CLINICAL DATA:  Lumbar fusion EXAM: DG C-ARM 61-120 MIN; LUMBAR SPINE - 2-3 VIEW COMPARISON:  Intraoperative radiographs dated 12/28/2017 FLUOROSCOPY TIME:  2 minutes 27 seconds FINDINGS: Removal of pedicle screws at L5. Placement of L5-S1 interbody spacer. Anterior spinal fusion hardware at L5-S1. L4-S1 posterior spinal fusion hardware, in satisfactory position. IMPRESSION: Intraoperative radiograph during L4-S1 fusion, as above. Electronically Signed   By: Julian Hy M.D.   On: 01/01/2018 21:44   Dg C-arm 1-60 Min  Result Date: 12/28/2017 CLINICAL DATA:  L4-5, L5-S1 fusion EXAM: LUMBAR SPINE - 2-3 VIEW; DG C-ARM 61-120 MIN COMPARISON:  None. FLUOROSCOPY TIME:  Radiation Exposure Index (as provided by the fluoroscopic device): Not available If the device does not provide the exposure index: Fluoroscopy Time:  6 minutes 36 seconds Number of Acquired Images:  3 FINDINGS:  Pedicle screws are noted at L4, L5 and S1 with posterior fixation rods. Wide laminectomy is noted as well. IMPRESSION: L4-5, L5-S1 fusion. Electronically Signed   By: Inez Catalina M.D.   On: 12/28/2017 15:46   Dg C-arm 1-60 Min  Result Date: 12/28/2017 CLINICAL DATA:  L4-5, L5-S1 fusion EXAM: LUMBAR SPINE - 2-3 VIEW; DG C-ARM 61-120 MIN COMPARISON:  None. FLUOROSCOPY TIME:  Radiation Exposure Index (as provided by the fluoroscopic device): Not available If the device does not provide the exposure index: Fluoroscopy Time:  6 minutes 36 seconds Number of Acquired Images:  3 FINDINGS: Pedicle screws are noted at L4, L5 and S1 with posterior fixation  rods. Wide laminectomy is noted as well. IMPRESSION: L4-5, L5-S1 fusion. Electronically Signed   By: Inez Catalina M.D.   On: 12/28/2017 15:46   Dg C-arm 1-60 Min  Result Date: 12/28/2017 CLINICAL DATA:  L4-5, L5-S1 fusion EXAM: LUMBAR SPINE - 2-3 VIEW; DG C-ARM 61-120 MIN COMPARISON:  None. FLUOROSCOPY TIME:  Radiation Exposure Index (as provided by the fluoroscopic device): Not available If the device does not provide the exposure index: Fluoroscopy Time:  6 minutes 36 seconds Number of Acquired Images:  3 FINDINGS: Pedicle screws are noted at L4, L5 and S1 with posterior fixation rods. Wide laminectomy is noted as well. IMPRESSION: L4-5, L5-S1 fusion. Electronically Signed   By: Inez Catalina M.D.   On: 12/28/2017 15:46   Dg Or Local Abdomen  Result Date: 01/01/2018 CLINICAL DATA:  OR for EXAM: OR LOCAL ABDOMEN COMPARISON:  CT scan of December 29, 2017. FINDINGS: The bowel gas pattern is normal. Status post bilateral intrapedicular screw placement of L4 and L5. Midline surgical staples are noted. Surgical clip is noted in the left side of the pelvis. Surgical clips are seen projected over the sacrum. No other foreign body is noted. IMPRESSION: Postsurgical changes are seen involving the lower lumbar spine. Surgical clips are noted in this area. No other  foreign body is noted. These results were called by telephone at the time of interpretation on 01/01/2018 at 3:18 pm to [facility] in OR 4, who verbally acknowledged these results. Electronically Signed   By: Marijo Conception, M.D.   On: 01/01/2018 15:19    Disposition:  Post op meds provided Pt will report to clinic in 2weeks Discharge Instructions    Incentive spirometry RT   Complete by:  As directed    Incentive spirometry RT   Complete by:  As directed       Follow-up Information    Melina Schools, MD Follow up in 2 week(s).   Specialty:  Orthopedic Surgery Contact information: [address] [place] [address] [phone]        [facility] [place] Follow up.   Why:  Physical and occupational therapy to follow up with you at home. Contact information: [place] [address] [phone]            Signed: Valinda Hoar 01/10/2018, 8:25 AM

## 2018-02-23 ENCOUNTER — Encounter: Payer: Self-pay | Admitting: Internal Medicine

## 2018-02-24 NOTE — Telephone Encounter (Signed)
Need to clarify what medication she is talking about.  Is she talking about lexapro?  If so, need to know when last took medication and confirm with pharmacy when she had this last refilled.

## 2018-02-24 NOTE — Telephone Encounter (Signed)
Left message for patient to return call. Need to clarify medication

## 2018-03-01 NOTE — Telephone Encounter (Signed)
LMTCB

## 2018-03-07 NOTE — Telephone Encounter (Signed)
Left message for patient to call back  

## 2018-06-18 ENCOUNTER — Other Ambulatory Visit: Payer: Self-pay | Admitting: Internal Medicine

## 2018-06-18 DIAGNOSIS — Z1239 Encounter for other screening for malignant neoplasm of breast: Secondary | ICD-10-CM

## 2018-06-18 DIAGNOSIS — R928 Other abnormal and inconclusive findings on diagnostic imaging of breast: Secondary | ICD-10-CM

## 2018-06-18 NOTE — Progress Notes (Signed)
Orders placed for f/u diagnostic mammogram and possible ultrasounds.  Due screening as well.

## 2018-06-23 ENCOUNTER — Telehealth: Payer: Self-pay | Admitting: Internal Medicine

## 2018-06-23 NOTE — Telephone Encounter (Signed)
-----   Message from Lars Masson, LPN sent at 7/92/1783  9:10 AM EDT ----- Regarding: RE: overdue mammogram I scheduled her mammogram for 3/31 and sent her a my chart with the appt date/time as she requested. ----- Message ----- From: [name], MD Sent: 06/18/2018   9:49 AM EDT To: Lars Masson, LPN Subject: overdue mammogram                              Per our records, pt overdue mammogram.  Needs to schedule.  I have placed order.  Send message back to me once scheduled.    Thanks.  Dr Nicki Reaper

## 2018-07-04 ENCOUNTER — Other Ambulatory Visit: Payer: BC Managed Care – PPO

## 2018-07-10 ENCOUNTER — Encounter: Payer: Self-pay | Admitting: Internal Medicine

## 2018-08-07 ENCOUNTER — Encounter: Payer: Self-pay | Admitting: Internal Medicine

## 2018-08-07 ENCOUNTER — Ambulatory Visit: Payer: Self-pay

## 2018-08-07 NOTE — Telephone Encounter (Signed)
Incoming call from patient with complaint of legs and feet swelling.  Onset of leges and feet swelling started today. Rates it mild.  Denies redness.  Pain is rated mild.   Occurred last summer traveling. Hx  Of Tubal.  Reviewed protocol.  Provided care advice.  Patient voiced understanding.  Transferred call to Patient to [facility]) office for scheduling.     Reason for Disposition . [1] MILD swelling of both ankles (i.e., pedal edema) AND [2] new onset or worsening  Answer Assessment - Initial Assessment Questions 1. ONSET: "When did the swelling start?" (e.g., minutes, hours, days)    today2. LOCATION: "What part of the leg is swollen?"  "Are both legs swollen or just one leg?"     *No Answer* 3. SEVERITY: "How bad is the swelling?" (e.g., localized; mild, moderate, severe)  - Localized - small area of swelling localized to one leg  - MILD pedal edema - swelling limited to foot and ankle, pitting edema < 1/4 inch (6 mm) deep, rest and elevation eliminate most or all swelling  - MODERATE edema - swelling of lower leg to knee, pitting edema > 1/4 inch (6 mm) deep, rest and elevation only partially reduce swelling  - SEVERE edema - swelling extends above knee, facial or hand swelling present      mild 4. REDNESS: "Does the swelling look red or infected?"     denies 5. PAIN: "Is the swelling painful to touch?" If so, ask: "How painful is it?"   (Scale 1-10; mild, moderate or severe)    mild 6. FEVER: "Do you have a fever?" If so, ask: "What is it, how was it measured, and when did it start?"      denies 7. CAUSE: "What do you think is causing the leg swelling?"   Not really 8. MEDICAL HISTORY: "Do you have a history of heart failure, kidney disease, liver failure, or cancer?"   denies 9. RECURRENT SYMPTOM: "Have you had leg swelling before?" If so, ask: "When was the last time?" "What happened that time?"     Past summer  10. OTHER SYMPTOMS: "Do you have any other  symptoms?" (e.g., chest pain, difficulty breathing)      denies 11. PREGNANCY: "Is there any chance you are pregnant?" "When was your last menstrual period?"       2011/ tubal  Protocols used: LEG SWELLING AND EDEMA-A-AH

## 2018-08-07 NOTE — Telephone Encounter (Signed)
Appointment scheduled with lauren this week. PCP not available

## 2018-08-08 ENCOUNTER — Ambulatory Visit (INDEPENDENT_AMBULATORY_CARE_PROVIDER_SITE_OTHER): Payer: BC Managed Care – PPO | Admitting: Family Medicine

## 2018-08-08 ENCOUNTER — Other Ambulatory Visit: Payer: Self-pay

## 2018-08-08 DIAGNOSIS — M7989 Other specified soft tissue disorders: Secondary | ICD-10-CM | POA: Diagnosis not present

## 2018-08-08 DIAGNOSIS — Z713 Dietary counseling and surveillance: Secondary | ICD-10-CM

## 2018-08-08 NOTE — Progress Notes (Signed)
Patient ID: Ariel Morgan, female   DOB: Nov 03, 1975, 43 y.o.   MRN: [id number]  Virtual Visit via video Note  This visit type was conducted due to national recommendations for restrictions regarding the COVID-19 pandemic (e.g. social distancing).  This format is felt to be most appropriate for this patient at this time.  All issues noted in this document were discussed and addressed.  No physical exam was performed (except for noted visual exam findings with Video Visits).   I connected with Ariel Morgan on 08/08/18 at  8:00 AM EDT by a video enabled telemedicine application and verified that I am speaking with the correct person using two identifiers. Location patient: home Location provider: [facility] Persons participating in the virtual visit: patient, provider  I discussed the limitations, risks, security and privacy concerns of performing an evaluation and management service by video and the availability of in person appointments. I also discussed with the patient that there may be a patient responsible charge related to this service. The patient expressed understanding and agreed to proceed.   HPI:  Patient and I connected via video today to discuss bilateral lower extremity swelling and also concerns over her weight gain.  Patient states she noticed both of her legs being a little swollen yesterday and became concerned this is why she called originally.  Would describe the swelling is mild, but has a history of back surgery involving nerves in her right leg so anytime she has swelling in the legs she gets a little nervous.  States she sat with her legs elevated for a few hours and drink some water, states after keeping her legs elevated for that period of time the swelling did go down.  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  Denies shortness of breath or wheezing.  Denies fever or chills.  Denies cough.  Denies chest pain or palpitations.  Denies GI or GU issues.  Patient also reports some weight gain and is concerned over her weight in general.  States she has gained approximately 10 pounds over the past 3 to 4 weeks.  Patient states she does have a Physiological scientist that she does a session with at least once or twice per week and also tries to do other weight training exercises.  States ever since her surgery and ever since the COVID-19 pandemic, she has not been as active.  Has been trying to make slight changes in her diet to eat healthier.  States instead of using bread for sandwiches she has been using wraps.  She usually will have coffee in various things for breakfast, a wrap and fruit for lunch and some sort of meat and vegetable for dinner.  States she is not counting her calories, but believes that she probably eats around 2000 cal/day.   ROS: See pertinent positives and negatives per HPI.  Past Medical History:  Diagnosis Date  . Anemia   . Anxiety   . Arthritis   . Frequent headaches    H/O  . GERD (gastroesophageal reflux disease)   . Hypercholesterolemia   . Placenta previa     Past Surgical History:  Procedure Laterality Date  . ABDOMINAL EXPOSURE N/A 01/01/2018   Procedure: ABDOMINAL EXPOSURE;  Surgeon: Marty Heck, MD;  Location: [facility];  Service: Vascular;  Laterality: N/A;  . ANTERIOR LUMBAR FUSION N/A 01/01/2018   Procedure: ANTERIOR LUMBAR FUSION L5-S1; REPLACEMENT OF posterior HARDWARE L4-S1;  Surgeon: Melina Schools, MD;  Location: [place];  Service: Orthopedics;  Laterality: N/A;  . BREAST BIOPSY Right 05/10/2017   right breast stereotatic bx path pending  . CESAREAN SECTION  2003 & 2010  . CHOLECYSTECTOMY  2005  . LUMBAR LAMINECTOMY/DECOMPRESSION MICRODISCECTOMY N/A 12/28/2017   Procedure: L4-S1 decompression and  fusion;  Surgeon: Melina Schools, MD;  Location: [place];  Service: Orthopedics;  Laterality: N/A;  4.5 hrs    Family History  Problem Relation Age of Onset  . Hyperlipidemia Father   . Diabetes Sister   . Diabetes Maternal Grandmother   . Cancer Maternal Grandmother        vaginal  . Cirrhosis Maternal Grandmother   . Colon polyps Maternal Grandmother   . Breast cancer Maternal Grandmother        Early 50's   Social History   Tobacco Use  . Smoking status: Former Smoker    Packs/day: 1.00    Types: Cigarettes  . Smokeless tobacco: Never Used  Substance Use Topics  . Alcohol use: Yes    Alcohol/week: 0.0 standard drinks    Comment: rarely    Current Outpatient Medications:  .  escitalopram (LEXAPRO) 20 MG tablet, TAKE 1 TABLET BY MOUTH EVERY DAY, Disp: 90 tablet, Rfl: 1 .  pregabalin (LYRICA) 100 MG capsule, Take 1 capsule (100 mg total) by mouth 2 (two) times daily., Disp: 60 capsule, Rfl: 0  EXAM:  GENERAL: alert, oriented, appears well and in no acute distress  HEENT: atraumatic, conjunttiva clear, no obvious abnormalities on inspection of external nose and ears  NECK: normal movements of the head and neck  LUNGS: on inspection no signs of respiratory distress, breathing rate appears normal, no obvious gross SOB, gasping or wheezing  CV: no obvious cyanosis  MS: moves all visible extremities without noticeable abnormality  PSYCH/NEURO: pleasant and cooperative, no obvious depression or anxiety, speech and thought processing grossly intact  ASSESSMENT AND PLAN:  Discussed the following assessment and plan:  Leg swelling  Weight loss counseling, encounter for  Suspect leg swelling could be from legs being in a dependent position for extended period of time.  Patient advised to keep legs elevated whenever possible and to look into wearing tall socks like a construction style hose to help avoid swelling.  Also encouraged to keep up good water intake and get up and  walk around various times throughout the day to keep blood flow going in legs.  Long discussion with patient in regards to weight loss and different ways to achieve weight loss.  Patient advised that oftentimes weight loss requires [place] to eat less and move more.  Patient states she has done Weight Watchers in the past and has had success with weight watchers program.  Patient will either do weight watchers again or begin counting her calories and eating around 1500-1600 cal or less per day to achieve weight loss.  Patient also given diet outlined plan that was created by Dr. Derrel Nip as a guide of food choices to eat/another option of a diet plan to follow to help promote weight loss.   I discussed the assessment and treatment plan with the patient. The patient was provided an opportunity to ask questions and all were answered. The patient agreed with the plan and demonstrated an understanding of the instructions.  The patient was advised to call back or seek an in-person evaluation if the symptoms worsen or if the condition fails to improve as anticipated.  15 minutes spent with patient discussing plan of care.   Jodelle Green, FNP

## 2018-09-10 ENCOUNTER — Other Ambulatory Visit: Payer: Self-pay | Admitting: Internal Medicine

## 2018-09-26 ENCOUNTER — Telehealth: Payer: Self-pay | Admitting: Internal Medicine

## 2018-09-26 DIAGNOSIS — E7800 Pure hypercholesterolemia, unspecified: Secondary | ICD-10-CM

## 2018-09-26 NOTE — Telephone Encounter (Signed)
Pt would like to have fasting labs done before Physical appt.. Order is needed please and Thank you!

## 2018-09-28 NOTE — Telephone Encounter (Signed)
I have ordered fasting labs. Let me know if anything else is needed

## 2018-09-28 NOTE — Telephone Encounter (Signed)
Labs ordered/signed.  Ok to schedule appt.

## 2018-10-03 NOTE — Telephone Encounter (Signed)
LMTCB and schedule fasting lab appt

## 2018-10-09 NOTE — Telephone Encounter (Signed)
Left message for pt to call the office and schedule fasting labs

## 2018-11-15 ENCOUNTER — Other Ambulatory Visit: Payer: Self-pay

## 2018-11-17 ENCOUNTER — Other Ambulatory Visit (HOSPITAL_COMMUNITY)
Admission: RE | Admit: 2018-11-17 | Discharge: 2018-11-17 | Disposition: A | Discharge status: Home / Self Care | Payer: BC Managed Care – PPO | Source: Ambulatory Visit | Attending: Internal Medicine | Admitting: Internal Medicine

## 2018-11-17 ENCOUNTER — Ambulatory Visit: Payer: BC Managed Care – PPO | Admitting: Internal Medicine

## 2018-11-17 ENCOUNTER — Other Ambulatory Visit: Payer: Self-pay

## 2018-11-17 ENCOUNTER — Encounter: Payer: Self-pay | Admitting: Internal Medicine

## 2018-11-17 VITALS — BP 128/70 | HR 90 | Temp 97.8°F | Resp 16 | Ht 62.0 in | Wt 208.0 lb

## 2018-11-17 DIAGNOSIS — Z0000 Encounter for general adult medical examination without abnormal findings: Secondary | ICD-10-CM | POA: Diagnosis not present

## 2018-11-17 DIAGNOSIS — F411 Generalized anxiety disorder: Secondary | ICD-10-CM | POA: Diagnosis not present

## 2018-11-17 DIAGNOSIS — Z124 Encounter for screening for malignant neoplasm of cervix: Secondary | ICD-10-CM

## 2018-11-17 DIAGNOSIS — E7800 Pure hypercholesterolemia, unspecified: Secondary | ICD-10-CM | POA: Diagnosis not present

## 2018-11-17 DIAGNOSIS — R634 Abnormal weight loss: Secondary | ICD-10-CM

## 2018-11-17 DIAGNOSIS — R635 Abnormal weight gain: Secondary | ICD-10-CM

## 2018-11-17 MED ORDER — ALPRAZOLAM 0.25 MG PO TABS: 0.2500 mg | ORAL_TABLET | Freq: Every day | ORAL | 0 refills | Status: DC | PRN

## 2018-11-17 NOTE — Progress Notes (Signed)
Patient ID: Ariel Morgan, female   DOB: 02-05-1976, 43 y.o.   MRN: [id number]   Subjective:    Patient ID: Ariel Morgan, female    DOB: 13-Jun-1975, 43 y.o.   MRN: [id number]  HPI  Patient here for her physical exam.  Reports increased stress.  Getting ready to start back to work.  Teaches first graders.  Some stress with family issues.  Discussed with her today.  On lexapro.  Request to have xanax to have prn.  Rarely takes.  No chest pain.  No sob.  No acid reflux.  No abdominal pain.  Bowels moving.  Back doing ok.  S/p surgery.  States she is frustrated about her weight.  Desires to lose weight.  She reports she is exercising and trying to watch what she eats.  Is interested in medication.  Discussed treatment options.  Discussed referral to our pharmacy. She is interested in saxenda.     Past Medical History:  Diagnosis Date  . Anemia   . Anxiety   . Arthritis   . Frequent headaches    H/O  . GERD (gastroesophageal reflux disease)   . Hypercholesterolemia   . Placenta previa    Past Surgical History:  Procedure Laterality Date  . ABDOMINAL EXPOSURE N/A 01/01/2018   Procedure: ABDOMINAL EXPOSURE;  Surgeon: Marty Heck, MD;  Location: [place];  Service: Vascular;  Laterality: N/A;  . ANTERIOR LUMBAR FUSION N/A 01/01/2018   Procedure: ANTERIOR LUMBAR FUSION L5-S1; REPLACEMENT OF posterior HARDWARE L4-S1;  Surgeon: Melina Schools, MD;  Location: [place];  Service: Orthopedics;  Laterality: N/A;  . BREAST BIOPSY Right 05/10/2017   right breast stereotatic bx path pending  . CESAREAN SECTION  2003 & 2010  . CHOLECYSTECTOMY  2005  . LUMBAR LAMINECTOMY/DECOMPRESSION MICRODISCECTOMY N/A 12/28/2017   Procedure: L4-S1 decompression and fusion;  Surgeon: Melina Schools, MD;  Location: [place];  Service: Orthopedics;  Laterality: N/A;  4.5 hrs   Family History  Problem Relation Age of Onset  . Hyperlipidemia Father   . Diabetes Sister   . Diabetes Maternal Grandmother   .  Cancer Maternal Grandmother        vaginal  . Cirrhosis Maternal Grandmother   . Colon polyps Maternal Grandmother   . Breast cancer Maternal Grandmother        Early 73's   Social History   Socioeconomic History  . Marital status: Married    Spouse name: Not on file  . Number of children: 3  . Years of education: Not on file  . Highest education level: Not on file  Occupational History  . Not on file  Social Needs  . Financial resource strain: Not on file  . Food insecurity    Worry: Not on file    Inability: Not on file  . Transportation needs    Medical: Not on file    Non-medical: Not on file  Tobacco Use  . Smoking status: Former Smoker    Packs/day: 1.00    Types: Cigarettes  . Smokeless tobacco: Never Used  Substance and Sexual Activity  . Alcohol use: Yes    Alcohol/week: 0.0 standard drinks    Comment: rarely  . Drug use: No  . Sexual activity: Not on file  Lifestyle  . Physical activity    Days per week: Not on file    Minutes per session: Not on file  . Stress: Not on file  Relationships  . Social connections    Talks  on phone: Not on file    Gets together: Not on file    Attends religious service: Not on file    Active member of club or organization: Not on file    Attends meetings of clubs or organizations: Not on file    Relationship status: Not on file  Other Topics Concern  . Not on file  Social History Narrative  . Not on file    Outpatient Encounter Medications as of 11/17/2018  Medication Sig  . ALPRAZolam (XANAX) 0.25 MG tablet Take 1 tablet (0.25 mg total) by mouth daily as needed for anxiety.  Marland Kitchen escitalopram (LEXAPRO) 20 MG tablet TAKE 1 TABLET BY MOUTH EVERY DAY  . pregabalin (LYRICA) 100 MG capsule Take 1 capsule (100 mg total) by mouth 2 (two) times daily.   No facility-administered encounter medications on file as of 11/17/2018.     Review of Systems  Constitutional: Negative for appetite change.       Weight gain.   HENT:  Negative for congestion and sinus pressure.   Eyes: Negative for pain and visual disturbance.  Respiratory: Negative for cough, chest tightness and shortness of breath.   Cardiovascular: Negative for chest pain, palpitations and leg swelling.  Gastrointestinal: Negative for abdominal pain, diarrhea, nausea and vomiting.  Genitourinary: Negative for difficulty urinating and dysuria.  Musculoskeletal: Negative for joint swelling and myalgias.  Skin: Negative for color change and rash.  Neurological: Negative for dizziness, light-headedness and headaches.  Hematological: Negative for adenopathy. Does not bruise/bleed easily.  Psychiatric/Behavioral: Negative for agitation and dysphoric mood.       Objective:    Physical Exam Constitutional:      General: She is not in acute distress.    Appearance: Normal appearance. She is well-developed.  HENT:     Right Ear: External ear normal.     Left Ear: External ear normal.  Eyes:     General: No scleral icterus.       Right eye: No discharge.        Left eye: No discharge.     Conjunctiva/sclera: Conjunctivae normal.  Neck:     Musculoskeletal: Neck supple. No muscular tenderness.     Thyroid: No thyromegaly.  Cardiovascular:     Rate and Rhythm: Normal rate and regular rhythm.  Pulmonary:     Effort: No tachypnea, accessory muscle usage or respiratory distress.     Breath sounds: Normal breath sounds. No decreased breath sounds or wheezing.  Chest:     Breasts:        Right: No inverted nipple, mass, nipple discharge or tenderness (no axillary adenopathy).        Left: No inverted nipple, mass, nipple discharge or tenderness (no axilarry adenopathy).  Abdominal:     General: Bowel sounds are normal.     Palpations: Abdomen is soft.     Tenderness: There is no abdominal tenderness.  Genitourinary:    Comments: Normal external genitalia.  Vaginal vault without lesions.  Cervix identified.  Pap smear performed.  Could not appreciate  any adnexal masses or tenderness.   Musculoskeletal:        General: No swelling or tenderness.  Lymphadenopathy:     Cervical: No cervical adenopathy.  Skin:    General: Skin is warm.     Findings: No erythema or rash.  Neurological:     Mental Status: She is alert and oriented to person, place, and time.  Psychiatric:        Mood  and Affect: Mood normal.        Behavior: Behavior normal.     BP 128/70   Pulse 90   Temp 97.8 F (36.6 C) (Temporal)   Resp 16   Ht 5\' 2"  (1.575 m)   Wt 208 lb (94.3 kg)   SpO2 97%   BMI 38.04 kg/m  Wt Readings from Last 3 Encounters:  11/17/18 208 lb (94.3 kg)  12/19/17 193 lb 11.2 oz (87.9 kg)  12/08/17 195 lb 12.8 oz (88.8 kg)     Lab Results  Component Value Date   WBC 11.7 (H) 01/01/2018   HGB 9.5 (L) 01/01/2018   HCT 28.0 (L) 01/01/2018   PLT 309 01/01/2018   GLUCOSE 161 (H) 01/01/2018   CHOL 201 (H) 04/26/2017   TRIG 158.0 (H) 04/26/2017   HDL 60.60 04/26/2017   LDLDIRECT 132.6 01/29/2013   LDLCALC 109 (H) 04/26/2017   ALT 27 04/26/2017   AST 18 04/26/2017   NA 137 01/01/2018   K 4.7 01/01/2018   CL 105 12/19/2017   CREATININE 0.83 12/19/2017   BUN 9 12/19/2017   CO2 23 12/19/2017   TSH 1.86 04/26/2017       Assessment & Plan:   Problem List Items Addressed This Visit    GAD (generalized anxiety disorder)    On lexapro. Discussed with her today. She does not feel needs any further intervention at this time.  Did request refill xanax.  Rarely takes.  Follow.        Relevant Medications   ALPRAZolam (XANAX) 0.25 MG tablet   Health care maintenance    Physical today 11/17/18.  PAP 11/17/18.  Schedule mammogram.        Hypercholesterolemia    Low cholesterol diet and exercise.  Follow lipid panel.        Weight loss    Discussed diet and exercise.  Have provided her information previously.  She is watching what she eats.  Is exercising.  Desires medication.  Discussed treatment options. Discussed meeting with  our pharmacist to discuss - possible saxenda.         Other Visit Diagnoses    Cervical cancer screening    -  Primary   Relevant Orders   Cytology - PAP( [facility])   Weight gain       Relevant Orders   Ambulatory referral to [facility]       [name], MD

## 2018-11-19 ENCOUNTER — Encounter: Payer: Self-pay | Admitting: Internal Medicine

## 2018-11-19 DIAGNOSIS — R634 Abnormal weight loss: Secondary | ICD-10-CM | POA: Insufficient documentation

## 2018-11-19 NOTE — Assessment & Plan Note (Signed)
Physical today 11/17/18.  PAP 11/17/18.  Schedule mammogram.

## 2018-11-19 NOTE — Assessment & Plan Note (Signed)
On lexapro. Discussed with her today. She does not feel needs any further intervention at this time.  Did request refill xanax.  Rarely takes.  Follow.

## 2018-11-19 NOTE — Assessment & Plan Note (Signed)
Discussed diet and exercise.  Have provided her information previously.  She is watching what she eats.  Is exercising.  Desires medication.  Discussed treatment options. Discussed meeting with our pharmacist to discuss - possible saxenda.

## 2018-11-19 NOTE — Assessment & Plan Note (Signed)
Low cholesterol diet and exercise.  Follow lipid panel.   

## 2018-11-21 LAB — CYTOLOGY - PAP
Diagnosis: NEGATIVE
HPV: NOT DETECTED

## 2018-11-22 ENCOUNTER — Encounter: Payer: Self-pay | Admitting: Internal Medicine

## 2018-11-30 ENCOUNTER — Ambulatory Visit: Payer: BC Managed Care – PPO | Admitting: Pharmacist

## 2018-11-30 DIAGNOSIS — R635 Abnormal weight gain: Secondary | ICD-10-CM

## 2018-11-30 DIAGNOSIS — F411 Generalized anxiety disorder: Secondary | ICD-10-CM

## 2018-11-30 NOTE — Patient Instructions (Signed)
Visit Information  Goals Addressed            This Visit's Progress     Patient Stated   . "I'm frustrated that I can't lose any weight" (pt-stated)       Current Barriers:  . 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 Current meal patterns: o Breakfast: Protein shake w/ non fat, unsweetened almond milk  o 9:30 am - break; grapes, almonds;  o Lunch: leftovers (chicken); often a sandwich; [place], sometimes mayonnaise; yogurt, chips o Supper: Chicken, pork chops, salmon patties; bakes, cooks in olive oil; tries to have 2 vegetables; generally have a carb, but eats a smaller serving size  o Snacks: Almonds; peanut butter crackers o Stress snacks: chips or chocolate right now . Current exercise: 30 minutes of treadmill daily for 2 weeks  . Current stressors: Work;   Public house manager):  Marland Kitchen Over the next 90 days, patient with work with PharmD and primary care provider to address weight loss  Interventions: . Comprehensive medication review performed, medication list updated in electronic medical record . Discussed optimal dietary options - minimize carbohydrates, maximize protein intake . Discussed exercise - encouraged continued daily cardio. Will discuss incorporation of weight training moving forward  . Discussed Saxenda. Discussed mechanism of action, benefits, possible side effects. Patient amenable to initiation. Will collaborate with Dr. Nicki Reaper to send this prescription in - start at 0.6 mg once daily x 1 week, then increase to 1.2 mg once daily . Will re-evaluate  and discuss other options if insurance doesn't cover Saxenda. Discussed Novo Cares coupon card to reduce copay . Discussed that phentermine would not be a preferred option, d/t short duration of therapy (max 12 weeks), and potential to exacerbate anxiety through increased HR/BP.   Patient Self Care Activities:  . Patient will continue to focus on healthy dietary and exercise choices. . Patient will report any questions or concerns to provider   Initial goal documentation        The patient verbalized understanding of instructions provided today and declined a print copy of patient instruction materials.    Plan: - Will collaborate with Dr. Nicki Reaper as above.   Catie [name], PharmD, [place] Pharmacist [facility] [place] [phone]

## 2018-11-30 NOTE — Chronic Care Management (AMB) (Signed)
Chronic Care Management   Note  11/30/2018 Name: Ariel Morgan MRN: [id number] DOB: 1975/07/23   Subjective:  Ariel Morgan is a 43 y.o. year old female who is a primary care patient of [name], MD. The CCM team was consulted for assistance with chronic disease management and care coordination needs.    Spoke with patient regarding medication management today.   Review of patient status, including review of consultants reports, laboratory and other test data, was performed as part of comprehensive evaluation and provision of chronic care management services.   Objective:  Lab Results  Component Value Date   CREATININE 0.83 12/19/2017   CREATININE 0.73 04/26/2017   CREATININE 0.7 01/28/2014       Component Value Date/Time   CHOL 201 (H) 04/26/2017 1032   TRIG 158.0 (H) 04/26/2017 1032   HDL 60.60 04/26/2017 1032   CHOLHDL 3 04/26/2017 1032   VLDL 31.6 04/26/2017 1032   LDLCALC 109 (H) 04/26/2017 1032   LDLDIRECT 132.6 01/29/2013 0837    Clinical ASCVD: No  The 10-year ASCVD risk score Mikey Bussing DC Jr., et al., 2013) is: 0.6%   Values used to calculate the score:     Age: 65 years     Sex: Female     Is Non-Hispanic African American: No     Diabetic: No     Tobacco smoker: No     Systolic Blood Pressure: 0000000 mmHg     Is BP treated: No     HDL Cholesterol: 60.6 mg/dL     Total Cholesterol: 201 mg/dL    BP Readings from Last 3 Encounters:  11/17/18 128/70  01/04/18 (!) 121/47  12/19/17 118/76    No Known Allergies  Medications Reviewed Today    Reviewed by De Hollingshead, [facility] (Pharmacist) on 11/30/18 at 1514  Med List Status: <None>  Medication Order Taking? Sig Documenting Provider Last Dose Status Informant  ALPRAZolam (XANAX) 0.25 MG tablet [id number] Yes Take 1 tablet (0.25 mg total) by mouth daily as needed for anxiety. [name], MD Taking Active   escitalopram (LEXAPRO) 20 MG tablet [id number] Yes TAKE 1 TABLET BY MOUTH EVERY DAY  [name], MD Taking Active   pregabalin (LYRICA) 100 MG capsule [id number] Yes Take 1 capsule (100 mg total) by mouth 2 (two) times daily. Mayo, Darla Lesches, PA-C Taking Active            Med Note [name], Jazara Swiney E   Thu Nov 30, 2018  3:14 PM) 50 mg once daily            Assessment:   Goals Addressed            This Visit's Progress     Patient Stated   . "I'm frustrated that I can't lose any weight" (pt-stated)       Current Barriers:  . 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 Current meal patterns: o Breakfast: Protein shake w/ non fat, unsweetened almond milk  o 9:30 am - break; grapes, almonds;  o Lunch: leftovers (  chicken); often a sandwich; [place], sometimes mayonnaise; yogurt, chips o Supper: Chicken, pork chops, salmon patties; bakes, cooks in olive oil; tries to have 2 vegetables; generally have a carb, but eats a smaller serving size  o Snacks: Almonds; peanut butter crackers o Stress snacks: chips or chocolate right now . Current exercise: 30 minutes of treadmill daily for 2 weeks  . Current stressors: Work;   Public house manager):  Marland Kitchen Over the next 90 days, patient with work with PharmD and primary care provider to address weight loss  Interventions: . Comprehensive medication review performed, medication list updated in electronic medical record . Discussed optimal dietary options - minimize carbohydrates, maximize protein intake . Discussed exercise - encouraged continued daily cardio. Will discuss incorporation of weight  training moving forward  . Discussed Saxenda. Discussed mechanism of action, benefits, possible side effects. Patient amenable to initiation. Will collaborate with Dr. Nicki Reaper to send this prescription in - start at 0.6 mg once daily x 1 week, then increase to 1.2 mg once daily . Will re-evaluate and discuss other options if insurance doesn't cover Saxenda. Discussed [employer] Cares coupon card to reduce copay . Discussed that phentermine would not be a preferred option, d/t short duration of therapy (max 12 weeks), and potential to exacerbate anxiety through increased HR/BP.   Patient Self Care Activities:  . Patient will continue to focus on healthy dietary and exercise choices. . Patient will report any questions or concerns to provider   Initial goal documentation        Plan: - Will collaborate with Dr. Nicki Reaper as above.   Catie [name], PharmD, [place] Pharmacist [facility] [place] [phone]

## 2018-12-01 NOTE — Progress Notes (Signed)
Reviewed above information.  Agree with assessment and plan.  Will start saxenda.    Thanks   [employer]

## 2018-12-05 ENCOUNTER — Other Ambulatory Visit: Payer: Self-pay

## 2018-12-05 ENCOUNTER — Encounter: Payer: Self-pay | Admitting: Internal Medicine

## 2018-12-05 ENCOUNTER — Other Ambulatory Visit: Payer: Self-pay | Admitting: Internal Medicine

## 2018-12-05 MED ORDER — ESCITALOPRAM OXALATE 20 MG PO TABS: 20.0000 mg | ORAL_TABLET | Freq: Every day | ORAL | 0 refills | Status: DC

## 2018-12-05 MED ORDER — PEN NEEDLES 32G X 4 MM MISC: 1.0000 "pen " | Freq: Every day | 1 refills | Status: DC

## 2018-12-05 MED ORDER — SAXENDA 18 MG/3ML ~~LOC~~ SOPN: PEN_INJECTOR | SUBCUTANEOUS | 1 refills | Status: AC

## 2018-12-05 NOTE — Telephone Encounter (Signed)
I have sent in the saxenda and the pens.   I have also sent pt a my chart message.

## 2018-12-05 NOTE — Telephone Encounter (Signed)
Does this patient need a script for saxenda sent in?

## 2018-12-05 NOTE — Telephone Encounter (Signed)
I sent in the saxenda.  (with 3 pens - one refill).  What else do I need to send in?  Just let me know how to order. Thanks.

## 2018-12-06 ENCOUNTER — Encounter: Payer: Self-pay | Admitting: Internal Medicine

## 2018-12-07 NOTE — Telephone Encounter (Signed)
Confirmed no other acute issues. No other symptoms noted. Only lesions are on her lips. Does not want appt. Would like to have the denavir sent in to pharmacy. Advised pt to be evaluated if any new acute/worsening symptoms.

## 2018-12-07 NOTE — Telephone Encounter (Signed)
I do not mind sending in denavir, but need to clarify a few things.  She mentioned lip swelling.  Any tongue swelling, fever or any other lesions on her face other than her lip or chin.  Can schedule doxy appt if needs to discuss.  If any acute symptoms,  Tongue swelling, etc - needs to be evaluated ( urgent care, etc).  Just let me know.

## 2018-12-07 NOTE — Telephone Encounter (Signed)
See other my chart message for more information.

## 2018-12-07 NOTE — Telephone Encounter (Signed)
Are you okay with sending something in for her or do you need to do a visit with her?

## 2018-12-08 ENCOUNTER — Ambulatory Visit
Admission: RE | Admit: 2018-12-08 | Discharge: 2018-12-08 | Disposition: A | Discharge status: Home / Self Care | Payer: BC Managed Care – PPO | Source: Ambulatory Visit | Attending: Internal Medicine | Admitting: Internal Medicine

## 2018-12-08 ENCOUNTER — Other Ambulatory Visit (INDEPENDENT_AMBULATORY_CARE_PROVIDER_SITE_OTHER): Payer: BC Managed Care – PPO

## 2018-12-08 ENCOUNTER — Other Ambulatory Visit: Payer: Self-pay | Admitting: Internal Medicine

## 2018-12-08 ENCOUNTER — Other Ambulatory Visit: Payer: Self-pay

## 2018-12-08 DIAGNOSIS — Z1239 Encounter for other screening for malignant neoplasm of breast: Secondary | ICD-10-CM | POA: Diagnosis present

## 2018-12-08 DIAGNOSIS — R928 Other abnormal and inconclusive findings on diagnostic imaging of breast: Secondary | ICD-10-CM

## 2018-12-08 DIAGNOSIS — E7800 Pure hypercholesterolemia, unspecified: Secondary | ICD-10-CM | POA: Diagnosis not present

## 2018-12-08 LAB — COMPREHENSIVE METABOLIC PANEL
ALT: 37 U/L — ABNORMAL HIGH (ref 0–35)
AST: 28 U/L (ref 0–37)
Albumin: 4.2 g/dL (ref 3.5–5.2)
Alkaline Phosphatase: 76 U/L (ref 39–117)
BUN: 13 mg/dL (ref 6–23)
CO2: 22 mEq/L (ref 19–32)
Calcium: 9.2 mg/dL (ref 8.4–10.5)
Chloride: 107 mEq/L (ref 96–112)
Creatinine, Ser: 0.74 mg/dL (ref 0.40–1.20)
GFR: 85.7 mL/min (ref >60.00)
Glucose, Bld: 82 mg/dL (ref 70–99)
Potassium: 3.9 mEq/L (ref 3.5–5.1)
Sodium: 138 mEq/L (ref 135–145)
Total Bilirubin: 0.6 mg/dL (ref 0.2–1.2)
Total Protein: 7.1 g/dL (ref 6.0–8.3)

## 2018-12-08 LAB — LIPID PANEL
Cholesterol: 166 mg/dL (ref 0–200)
HDL: 62 mg/dL (ref >39.00)
LDL Cholesterol: 88 mg/dL (ref 0–99)
NonHDL: 103.54
Total CHOL/HDL Ratio: 3
Triglycerides: 76 mg/dL (ref 0.0–149.0)
VLDL: 15.2 mg/dL (ref 0.0–40.0)

## 2018-12-08 LAB — CBC WITH DIFFERENTIAL/PLATELET
Basophils Absolute: 0.1 10*3/uL (ref 0.0–0.1)
Basophils Relative: 1.4 % (ref 0.0–3.0)
Eosinophils Absolute: 0.2 10*3/uL (ref 0.0–0.7)
Eosinophils Relative: 1.9 % (ref 0.0–5.0)
HCT: 39.7 % (ref 36.0–46.0)
Hemoglobin: 13.3 g/dL (ref 12.0–15.0)
Lymphocytes Relative: 29.7 % (ref 12.0–46.0)
Lymphs Abs: 3 10*3/uL (ref 0.7–4.0)
MCHC: 33.4 g/dL (ref 30.0–36.0)
MCV: 88.3 fl (ref 78.0–100.0)
Monocytes Absolute: 0.6 10*3/uL (ref 0.1–1.0)
Monocytes Relative: 6.1 % (ref 3.0–12.0)
Neutro Abs: 6.1 10*3/uL (ref 1.4–7.7)
Neutrophils Relative %: 60.9 % (ref 43.0–77.0)
Platelets: 324 10*3/uL (ref 150.0–400.0)
RBC: 4.49 Mil/uL (ref 3.87–5.11)
RDW: 13.5 % (ref 11.5–15.5)
WBC: 9.9 10*3/uL (ref 4.0–10.5)

## 2018-12-08 LAB — TSH: TSH: 1.07 u[IU]/mL (ref 0.35–4.50)

## 2018-12-08 MED ORDER — DENAVIR 1 % EX CREA: TOPICAL_CREAM | CUTANEOUS | 0 refills | Status: DC

## 2018-12-08 NOTE — Telephone Encounter (Signed)
rx sent in for denavir 

## 2018-12-12 NOTE — Telephone Encounter (Signed)
rx sent in for acyclovir cream.

## 2018-12-12 NOTE — Telephone Encounter (Signed)
Are you okay with switching to the acyclovir since it is cheaper?

## 2018-12-12 NOTE — Telephone Encounter (Signed)
Please clarify with the pharmacy what I need to do for saxenda.  I have sent in the rx.

## 2018-12-13 ENCOUNTER — Other Ambulatory Visit: Payer: Self-pay | Admitting: Internal Medicine

## 2018-12-13 DIAGNOSIS — R7989 Other specified abnormal findings of blood chemistry: Secondary | ICD-10-CM

## 2018-12-13 DIAGNOSIS — R945 Abnormal results of liver function studies: Secondary | ICD-10-CM

## 2018-12-13 NOTE — Progress Notes (Signed)
Order placed for f/u liver panel.  

## 2018-12-15 NOTE — Telephone Encounter (Signed)
PA is needed. Pt aware/.

## 2018-12-28 ENCOUNTER — Ambulatory Visit: Payer: Self-pay | Admitting: Pharmacist

## 2018-12-28 NOTE — Chronic Care Management (AMB) (Signed)
  Chronic Care Management   Note  12/28/2018 Name: NAO CALLICOAT MRN: [id number] DOB: Nov 01, 1975  Ariel Morgan is a 43 y.o. year old female who is a primary care patient of Ariel Pheasant, MD. The CCM team was consulted for assistance with chronic disease management and care coordination needs.  Discussed with Ariel Scheuermann, LPN. Patient's PA for Ariel Morgan was denied. Ariel Morgan will pursue the appeal process. If appeal denied, will prescribe for Ariel Morgan if Ariel Morgan in agreement.     Ariel Morgan, PharmD, [place] Pharmacist [facility] [phone]

## 2018-12-29 NOTE — Progress Notes (Signed)
Reviewed.    Dr Seeley Hissong 

## 2019-01-03 ENCOUNTER — Telehealth: Payer: Self-pay | Admitting: Internal Medicine

## 2019-01-03 NOTE — Telephone Encounter (Signed)
Caller name: Murlean Hark  Relation to pt: from CoverMyMeds  Call back number: [phone]    Reason for call:  Fax PA on 12/21/2018 Liraglutide -Weight Management (SAXENDA) 18 MG/3ML SOPN , checking on the status, please advise

## 2019-01-03 NOTE — Telephone Encounter (Signed)
Pa has been submitted

## 2019-01-04 NOTE — Telephone Encounter (Signed)
PA was approved for saxenda. Left message for patient letting her know.

## 2019-01-08 ENCOUNTER — Ambulatory Visit: Payer: Self-pay | Admitting: Pharmacist

## 2019-01-08 NOTE — Chronic Care Management (AMB) (Signed)
  Chronic Care Management   Note  01/08/2019 Name: Ariel Morgan MRN: [id number] DOB: Sep 01, 1975  [name] is a 43 y.o. year old female who is a primary care patient of [name], MD. The CCM team was consulted for assistance with chronic disease management and care coordination needs.    Received message from Garnette Scheuermann, LPN that PA for [place] was approved. Contacted patient to discuss if she had been able to start the medication yet; left HIPAA compliant message for her to return my call at her convenience.   Follow up plan: - If I do not hear back, will outreach again in the next 1-2 weeks  Catie [name], PharmD, [place] Pharmacist [facility] [place] [phone]

## 2019-01-08 NOTE — Progress Notes (Signed)
Reviewed.  Agree with plan   Dr Jaidyn Usery 

## 2019-01-09 ENCOUNTER — Other Ambulatory Visit: Payer: BC Managed Care – PPO

## 2019-01-12 ENCOUNTER — Other Ambulatory Visit: Payer: BC Managed Care – PPO

## 2019-01-17 ENCOUNTER — Ambulatory Visit: Payer: Self-pay | Admitting: Pharmacist

## 2019-01-17 DIAGNOSIS — Z713 Dietary counseling and surveillance: Secondary | ICD-10-CM

## 2019-01-17 NOTE — Patient Instructions (Signed)
Visit Information  Goals Addressed            This Visit's Progress     Patient Stated   . "I'm frustrated that I can't lose any weight" (pt-stated)       Current Barriers:  . Obesity; started on Saxenda for weight loss o Reports started medication ~01/07/2019 at 0.6 mg once daily for 1 week, then increased to 1.2 mg; reports that she has now developed some burping, diarrhea (lessened over the past few days); decreased appetite and sour stomach, and generally is feeling poorly.   . Current exercise: 30 minutes of treadmill daily for 2 weeks  . Current stressors: Work;   Public house manager):  Marland Kitchen Over the next 90 days, patient with work with PharmD and primary care provider to address weight loss  Interventions: . Comprehensive medication review performed, medication list updated in electronic medical record . Recommended patient decrease to 0.6 mg for another week. At that point, we will evaluate whether her GI upset improved and we can discuss a slower titration, or if we need to discontinue medication d/t intolerable side effects.   Patient Self Care Activities:  . Patient will continue to focus on healthy dietary and exercise choices. . Patient will report any questions or concerns to provider   Please see past updates related to this goal by clicking on the "Past Updates" button in the selected goal         The patient verbalized understanding of instructions provided today and declined a print copy of patient instruction materials.   Plan:  - Will outreach patient next week to f/u on medication tolerability.   Catie [name], PharmD Clinical Pharmacist [place] [phone]

## 2019-01-17 NOTE — Chronic Care Management (AMB) (Signed)
  Chronic Care Management   Follow Up Note   01/17/2019 Name: Ariel Morgan MRN: [id number] DOB: 1975-06-29  Referred by: [name], MD Reason for referral : Chronic Care Management (Medication Management)   Ariel Morgan is a 43 y.o. year old female who is a primary care patient of [name], MD. The CCM team was consulted for assistance with chronic disease management and care coordination needs.    Received call from patient today with questions about Saxenda and side effects.   Review of patient status, including review of consultants reports, relevant laboratory and other test results, and collaboration with appropriate care team members and the patient's provider was performed as part of comprehensive patient evaluation and provision of chronic care management services.    SDOH (Social Determinants of Health) screening performed today: None. See Care Plan for related entries.   Advanced Directives Status: N See Care Plan and Vynca application for related entries.  Outpatient Encounter Medications as of 01/17/2019  Medication Sig  . Liraglutide -Weight Management (SAXENDA) 18 MG/3ML SOPN Inject 1.2 mg into the skin daily.  Marland Kitchen acyclovir cream (ZOVIRAX) 5 % Apply to affected area qid.  Marland Kitchen ALPRAZolam (XANAX) 0.25 MG tablet Take 1 tablet (0.25 mg total) by mouth daily as needed for anxiety.  Marland Kitchen escitalopram (LEXAPRO) 20 MG tablet Take 1 tablet (20 mg total) by mouth daily.  . Insulin Pen Needle (PEN NEEDLES) 32G X 4 MM MISC Inject 1 pen into the skin daily. Use with Saxenda   No facility-administered encounter medications on file as of 01/17/2019.      Goals Addressed            This Visit's Progress     Patient Stated   . "I'm frustrated that I can't lose any weight" (pt-stated)       Current Barriers:  . Obesity; started on Saxenda for weight loss o Reports started medication ~01/07/2019 at 0.6 mg once daily for 1 week, then increased to 1.2 mg; reports  that she has now developed some burping, diarrhea (lessened over the past few days); decreased appetite and sour stomach, and generally is feeling poorly.   . Current exercise: 30 minutes of treadmill daily for 2 weeks  . Current stressors: Work;   Public house manager):  Marland Kitchen Over the next 90 days, patient with work with PharmD and primary care provider to address weight loss  Interventions: . Comprehensive medication review performed, medication list updated in electronic medical record . Recommended patient decrease to 0.6 mg for another week. At that point, we will evaluate whether her GI upset improved and we can discuss a slower titration, or if we need to discontinue medication d/t intolerable side effects.   Patient Self Care Activities:  . Patient will continue to focus on healthy dietary and exercise choices. . Patient will report any questions or concerns to provider   Please see past updates related to this goal by clicking on the "Past Updates" button in the selected goal         Plan:  - Will outreach patient next week to f/u on medication tolerability.   Catie [name], PharmD Clinical Pharmacist [place] [phone]

## 2019-01-17 NOTE — Progress Notes (Signed)
Reviewed information.  Side effects of saxenda.  Will follow.  Agree with plan.    Dr Nicki Reaper

## 2019-01-18 ENCOUNTER — Other Ambulatory Visit: Payer: Self-pay

## 2019-01-18 ENCOUNTER — Other Ambulatory Visit (INDEPENDENT_AMBULATORY_CARE_PROVIDER_SITE_OTHER): Payer: BC Managed Care – PPO

## 2019-01-18 DIAGNOSIS — R945 Abnormal results of liver function studies: Secondary | ICD-10-CM

## 2019-01-18 DIAGNOSIS — R7989 Other specified abnormal findings of blood chemistry: Secondary | ICD-10-CM

## 2019-01-19 LAB — HEPATIC FUNCTION PANEL
ALT: 28 U/L (ref 0–35)
AST: 22 U/L (ref 0–37)
Albumin: 4.2 g/dL (ref 3.5–5.2)
Alkaline Phosphatase: 91 U/L (ref 39–117)
Bilirubin, Direct: 0.1 mg/dL (ref 0.0–0.3)
Total Bilirubin: 0.5 mg/dL (ref 0.2–1.2)
Total Protein: 6.7 g/dL (ref 6.0–8.3)

## 2019-01-20 ENCOUNTER — Encounter: Payer: Self-pay | Admitting: Internal Medicine

## 2019-02-23 ENCOUNTER — Ambulatory Visit (INDEPENDENT_AMBULATORY_CARE_PROVIDER_SITE_OTHER): Payer: BC Managed Care – PPO | Admitting: Internal Medicine

## 2019-02-23 ENCOUNTER — Encounter: Payer: Self-pay | Admitting: Internal Medicine

## 2019-02-23 DIAGNOSIS — R928 Other abnormal and inconclusive findings on diagnostic imaging of breast: Secondary | ICD-10-CM

## 2019-02-23 DIAGNOSIS — R634 Abnormal weight loss: Secondary | ICD-10-CM

## 2019-02-23 DIAGNOSIS — K219 Gastro-esophageal reflux disease without esophagitis: Secondary | ICD-10-CM | POA: Diagnosis not present

## 2019-02-23 DIAGNOSIS — E7800 Pure hypercholesterolemia, unspecified: Secondary | ICD-10-CM | POA: Diagnosis not present

## 2019-02-23 DIAGNOSIS — F411 Generalized anxiety disorder: Secondary | ICD-10-CM

## 2019-02-23 DIAGNOSIS — M79606 Pain in leg, unspecified: Secondary | ICD-10-CM

## 2019-02-23 NOTE — Progress Notes (Signed)
Patient ID: Ariel Morgan, female   DOB: Sep 28, 1975, 43 y.o.   MRN: [id number]   Virtual Visit via video Note  This visit type was conducted due to national recommendations for restrictions regarding the COVID-19 pandemic (e.g. social distancing).  This format is felt to be most appropriate for this patient at this time.  All issues noted in this document were discussed and addressed.  No physical exam was performed (except for noted visual exam findings with Video Visits).   I connected with Ariel Morgan by a video enabled telemedicine application and verified that I am speaking with the correct person using two identifiers. Location patient: home Location provider: work  Persons participating in the virtual visit: patient, provider  I discussed the limitations, risks, security and privacy concerns of performing an evaluation and management service by video and the availability of in person appointments.  The patient expressed understanding and agreed to proceed.   Reason for visit: scheduled follow up.    HPI: She reports she is doing relatively well.  Increased stress with school and family issues.  Discussed with her today.  Overall she feels she is doing relatively well.  On lexapro. Feels this is working relatively well.  No chest pain.  No sob.  No acid reflux.  No abdominal pain.  Bowels moving.  On saxenda now for weight loss.  Tolerating better.  Increased dose - nausea.  Has lost weight.  Was 209 pounds when started. Now weighs - 193 pounds.  Has noticed some leg pain.  States muscles in legs hurt when walking.  Back on lyrica.  Surgeon prescribes.  Following with surgery regarding her back.  Overall feels things are relatively stable.  Discussed exercise and stretches.     ROS: See pertinent positives and negatives per HPI.  Past Medical History:  Diagnosis Date  . Anemia   . Anxiety   . Arthritis   . Frequent headaches    H/O  . GERD (gastroesophageal reflux disease)   .  Hypercholesterolemia   . Placenta previa     Past Surgical History:  Procedure Laterality Date  . ABDOMINAL EXPOSURE N/A 01/01/2018   Procedure: ABDOMINAL EXPOSURE;  Surgeon: Marty Heck, MD;  Location: [place];  Service: Vascular;  Laterality: N/A;  . ANTERIOR LUMBAR FUSION N/A 01/01/2018   Procedure: ANTERIOR LUMBAR FUSION L5-S1; REPLACEMENT OF posterior HARDWARE L4-S1;  Surgeon: Melina Schools, MD;  Location: [place];  Service: Orthopedics;  Laterality: N/A;  . BREAST BIOPSY Right 05/10/2017   right breast stereotatic bx path pending  . CESAREAN SECTION  2003 & 2010  . CHOLECYSTECTOMY  2005  . LUMBAR LAMINECTOMY/DECOMPRESSION MICRODISCECTOMY N/A 12/28/2017   Procedure: L4-S1 decompression and fusion;  Surgeon: Melina Schools, MD;  Location: [place];  Service: Orthopedics;  Laterality: N/A;  4.5 hrs    Family History  Problem Relation Age of Onset  . Hyperlipidemia Father   . Diabetes Sister   . Diabetes Maternal Grandmother   . Cancer Maternal Grandmother        vaginal  . Cirrhosis Maternal Grandmother   . Colon polyps Maternal Grandmother   . Breast cancer Maternal Grandmother        Early 22's    SOCIAL HX: reviewed.    Current Outpatient Medications:  .  acyclovir cream (ZOVIRAX) 5 %, Apply to affected area qid., Disp: 15 g, Rfl: 0 .  ALPRAZolam (XANAX) 0.25 MG tablet, Take 1 tablet (0.25 mg total) by mouth daily as needed for  anxiety., Disp: 20 tablet, Rfl: 0 .  escitalopram (LEXAPRO) 20 MG tablet, Take 1 tablet (20 mg total) by mouth daily., Disp: 90 tablet, Rfl: 0 .  Insulin Pen Needle (PEN NEEDLES) 32G X 4 MM MISC, Inject 1 pen into the skin daily. Use with Saxenda, Disp: 100 each, Rfl: 1 .  Liraglutide -Weight Management (SAXENDA) 18 MG/3ML SOPN, Inject 1.2 mg into the skin daily., Disp: , Rfl:   EXAM:  VITALS per patient if applicable: 0000000 pounds.   GENERAL: alert, oriented, appears well and in no acute distress  HEENT: atraumatic, conjunttiva clear, no  obvious abnormalities on inspection of external nose and ears  NECK: normal movements of the head and neck  LUNGS: on inspection no signs of respiratory distress, breathing rate appears normal, no obvious gross SOB, gasping or wheezing  CV: no obvious cyanosis  PSYCH/NEURO: pleasant and cooperative, no obvious depression or anxiety, speech and thought processing grossly intact  ASSESSMENT AND PLAN:  Discussed the following assessment and plan:  Abnormal mammogram Previous abnormal mammogram.  Had mammogram 12/08/18 - birads II.    GAD (generalized anxiety disorder) On lexapro. Discussed with her today.  Overall feels things are stable on current medication regimen.  Follow.    GERD (gastroesophageal reflux disease) Controlled.    Hypercholesterolemia Low cholesterol diet and exercise.  Follow lipid panel.   Weight loss On saxenda.  Tolerating better.  Has lost weight.  Discussed diet and exercise.  Follow.    Leg pain Leg pain as outlined.  Taking lyrica - prescribed by her surgeon.  Stretches.  Exercise.  Follow.      I discussed the assessment and treatment plan with the patient. The patient was provided an opportunity to ask questions and all were answered. The patient agreed with the plan and demonstrated an understanding of the instructions.   The patient was advised to call back or seek an in-person evaluation if the symptoms worsen or if the condition fails to improve as anticipated.   [name], MD

## 2019-02-25 ENCOUNTER — Encounter: Payer: Self-pay | Admitting: Internal Medicine

## 2019-02-25 DIAGNOSIS — M79606 Pain in leg, unspecified: Secondary | ICD-10-CM | POA: Insufficient documentation

## 2019-02-25 NOTE — Assessment & Plan Note (Signed)
Low cholesterol diet and exercise.  Follow lipid panel.   

## 2019-02-25 NOTE — Assessment & Plan Note (Signed)
Controlled.  

## 2019-02-25 NOTE — Assessment & Plan Note (Signed)
Previous abnormal mammogram.  Had mammogram 12/08/18 - birads II.

## 2019-02-25 NOTE — Assessment & Plan Note (Signed)
On lexapro. Discussed with her today.  Overall feels things are stable on current medication regimen.  Follow.

## 2019-02-25 NOTE — Assessment & Plan Note (Signed)
On saxenda.  Tolerating better.  Has lost weight.  Discussed diet and exercise.  Follow.

## 2019-02-25 NOTE — Assessment & Plan Note (Signed)
Leg pain as outlined.  Taking lyrica - prescribed by her surgeon.  Stretches.  Exercise.  Follow.

## 2019-03-24 ENCOUNTER — Other Ambulatory Visit: Payer: Self-pay | Admitting: Internal Medicine

## 2019-04-01 ENCOUNTER — Encounter: Payer: Self-pay | Admitting: Internal Medicine

## 2019-04-02 MED ORDER — PREGABALIN 50 MG PO CAPS: 50.0000 mg | ORAL_CAPSULE | Freq: Every day | ORAL | 1 refills | Status: DC

## 2019-04-02 NOTE — Telephone Encounter (Signed)
Patient is good until your return and advise if you would take refilling her Lyrica.

## 2019-04-02 NOTE — Telephone Encounter (Signed)
I sent in rx for lyrica.  I will refill as long as she is stable on the medication.  Keep f/u scheduled with me.

## 2019-04-12 ENCOUNTER — Other Ambulatory Visit: Payer: Self-pay | Admitting: Internal Medicine

## 2019-05-17 ENCOUNTER — Encounter: Payer: Self-pay | Admitting: Internal Medicine

## 2019-05-23 ENCOUNTER — Emergency Department: Payer: BC Managed Care – PPO

## 2019-05-23 ENCOUNTER — Other Ambulatory Visit: Payer: Self-pay

## 2019-05-23 ENCOUNTER — Emergency Department
Admission: EM | Admit: 2019-05-23 | Discharge: 2019-05-23 | Disposition: A | Discharge status: Home / Self Care | Payer: BC Managed Care – PPO | Attending: Emergency Medicine | Admitting: Emergency Medicine

## 2019-05-23 DIAGNOSIS — Z79899 Other long term (current) drug therapy: Secondary | ICD-10-CM | POA: Diagnosis not present

## 2019-05-23 DIAGNOSIS — R1030 Lower abdominal pain, unspecified: Secondary | ICD-10-CM | POA: Diagnosis present

## 2019-05-23 DIAGNOSIS — Z87891 Personal history of nicotine dependence: Secondary | ICD-10-CM | POA: Diagnosis not present

## 2019-05-23 DIAGNOSIS — R102 Pelvic and perineal pain: Secondary | ICD-10-CM | POA: Insufficient documentation

## 2019-05-23 DIAGNOSIS — D219 Benign neoplasm of connective and other soft tissue, unspecified: Secondary | ICD-10-CM

## 2019-05-23 DIAGNOSIS — D259 Leiomyoma of uterus, unspecified: Secondary | ICD-10-CM | POA: Diagnosis not present

## 2019-05-23 LAB — COMPREHENSIVE METABOLIC PANEL
ALT: 23 U/L (ref 0–44)
AST: 21 U/L (ref 15–41)
Albumin: 4.1 g/dL (ref 3.5–5.0)
Alkaline Phosphatase: 77 U/L (ref 38–126)
Anion gap: 11 (ref 5–15)
BUN: 10 mg/dL (ref 6–20)
CO2: 22 mmol/L (ref 22–32)
Calcium: 9.2 mg/dL (ref 8.9–10.3)
Chloride: 104 mmol/L (ref 98–111)
Creatinine, Ser: 0.63 mg/dL (ref 0.44–1.00)
GFR calc Af Amer: >60 mL/min (ref >60)
GFR calc non Af Amer: >60 mL/min (ref >60)
Glucose, Bld: 90 mg/dL (ref 70–99)
Potassium: 3.5 mmol/L (ref 3.5–5.1)
Sodium: 137 mmol/L (ref 135–145)
Total Bilirubin: 0.9 mg/dL (ref 0.3–1.2)
Total Protein: 7.5 g/dL (ref 6.5–8.1)

## 2019-05-23 LAB — PREGNANCY, URINE: Preg Test, Ur: NEGATIVE

## 2019-05-23 LAB — CBC
HCT: 39.8 % (ref 36.0–46.0)
Hemoglobin: 13.4 g/dL (ref 12.0–15.0)
MCH: 29 pg (ref 26.0–34.0)
MCHC: 33.7 g/dL (ref 30.0–36.0)
MCV: 86.1 fL (ref 80.0–100.0)
Platelets: 358 10*3/uL (ref 150–400)
RBC: 4.62 MIL/uL (ref 3.87–5.11)
RDW: 12.4 % (ref 11.5–15.5)
WBC: 9.4 10*3/uL (ref 4.0–10.5)
nRBC: 0 % (ref 0.0–0.2)

## 2019-05-23 LAB — URINALYSIS, COMPLETE (UACMP) WITH MICROSCOPIC
Bilirubin Urine: NEGATIVE
Glucose, UA: NEGATIVE mg/dL
Hgb urine dipstick: NEGATIVE
Ketones, ur: NEGATIVE mg/dL
Leukocytes,Ua: NEGATIVE
Nitrite: NEGATIVE
Protein, ur: NEGATIVE mg/dL
Specific Gravity, Urine: 1.005 (ref 1.005–1.030)
pH: 6 (ref 5.0–8.0)

## 2019-05-23 LAB — LIPASE, BLOOD: Lipase: 25 U/L (ref 11–51)

## 2019-05-23 MED ORDER — OXYCODONE-ACETAMINOPHEN 5-325 MG PO TABS: 1.0000 | ORAL_TABLET | Freq: Two times a day (BID) | ORAL | 0 refills | Status: DC | PRN

## 2019-05-23 MED ORDER — SODIUM CHLORIDE 0.9% FLUSH: 3.0000 mL | Freq: Once | INTRAVENOUS | Status: DC

## 2019-05-23 MED ORDER — IOHEXOL 300 MG/ML  SOLN
100.0000 mL | Freq: Once | INTRAMUSCULAR | Status: AC | PRN
  Administered 2019-05-23: 100 mL via INTRAVENOUS

## 2019-05-23 MED ORDER — OXYCODONE-ACETAMINOPHEN 5-325 MG PO TABS
2.0000 | ORAL_TABLET | Freq: Once | ORAL | Status: AC
  Administered 2019-05-23: 2 via ORAL
  Filled 2019-05-23: qty 2

## 2019-05-23 NOTE — ED Triage Notes (Signed)
Pt comes via POV from [facility]  With c/o of RLQ pain. Pt states this started 3 days ago. Pt states Marianne sent her over here for further evaluation.  Pt states some nausea. Pt denies any urinary symptoms.

## 2019-05-23 NOTE — ED Notes (Addendum)
Patient to US

## 2019-05-23 NOTE — ED Provider Notes (Signed)
IMPRESSION: 1. Uterine fibroids. 2. Mostly cystic mass within the central fundal portion of the uterus, felt to correspond to the rim enhancing structure noted on CT. Differential considerations include cystic degeneration of fibroid versus cystic endometrial fluid collection as may be seen with distal stenosis or obstruction. 3. Nonvisualized left ovary  Ultrasound findings as dictated above.  She is cleared for outpatient OB/GYN follow-up.   Earleen Newport, MD 05/23/19 2120

## 2019-05-23 NOTE — Discharge Instructions (Addendum)
Please seek medical attention for any high fevers, chest pain, shortness of breath, change in behavior, persistent vomiting, bloody stool or any other new or concerning symptoms.  

## 2019-05-23 NOTE — ED Notes (Addendum)
Patient back to room from CT

## 2019-05-23 NOTE — ED Provider Notes (Signed)
[facility] Emergency Department Provider Note   ____________________________________________   I have reviewed the triage vital signs and the nursing notes.   HISTORY  Chief Complaint Abdominal Pain   History limited by: Not Limited   HPI Ariel Morgan is a 44 y.o. female who presents to the emergency department today because of concern for abdominal pain. She states the pain started three days ago. Initially located across her lower abdomen she states it is now more concentrated on the right side. When the pain started it was initially intermittent although she has noticed that the pain has become more constant today. She has had some nausea although states she takes to nausea easily. Denies any diarrhea or bloody stool. No fevers. No unusual ingestions.    Records reviewed. Per medical record review patient has a history of cholecystectomy.   Past Medical History:  Diagnosis Date  . Anemia   . Anxiety   . Arthritis   . Frequent headaches    H/O  . GERD (gastroesophageal reflux disease)   . Hypercholesterolemia   . Placenta previa     Patient Active Problem List   Diagnosis Date Noted  . Leg pain 02/25/2019  . Weight loss 11/19/2018  . Surgery, elective   . Hyperlipidemia   . Anxiety state   . Acute blood loss anemia   . Post-operative pain   . FUO (fever of unknown origin)   . S/P lumbar fusion 12/28/2017  . Pre-op evaluation 12/11/2017  . Abnormal mammogram 07/23/2017  . Microcalcification of right breast on mammogram 05/04/2017  . Low back pain 06/17/2015  . Health care maintenance 03/15/2015  . Herpes zoster 09/16/2014  . GAD (generalized anxiety disorder) 12/03/2012  . GERD (gastroesophageal reflux disease) 12/03/2012  . Headache 12/03/2012  . Hypercholesterolemia 12/03/2012    Past Surgical History:  Procedure Laterality Date  . ABDOMINAL EXPOSURE N/A 01/01/2018   Procedure: ABDOMINAL EXPOSURE;  Surgeon: Marty Heck, MD;  Location: [place];  Service: Vascular;  Laterality: N/A;  . ANTERIOR LUMBAR FUSION N/A 01/01/2018   Procedure: ANTERIOR LUMBAR FUSION L5-S1; REPLACEMENT OF posterior HARDWARE L4-S1;  Surgeon: Melina Schools, MD;  Location: [place];  Service: Orthopedics;  Laterality: N/A;  . BREAST BIOPSY Right 05/10/2017   right breast stereotatic bx path pending  . CESAREAN SECTION  2003 & 2010  . CHOLECYSTECTOMY  2005  . LUMBAR LAMINECTOMY/DECOMPRESSION MICRODISCECTOMY N/A 12/28/2017   Procedure: L4-S1 decompression and fusion;  Surgeon: Melina Schools, MD;  Location: [place];  Service: Orthopedics;  Laterality: N/A;  4.5 hrs    Prior to Admission medications   Medication Sig Start Date End Date Taking? Authorizing Provider  acyclovir cream (ZOVIRAX) 5 % Apply to affected area qid. 12/12/18   [name], MD  ALPRAZolam Duanne Moron) 0.25 MG tablet Take 1 tablet (0.25 mg total) by mouth daily as needed for anxiety. 11/17/18   [name], MD  escitalopram (LEXAPRO) 20 MG tablet TAKE 1 TABLET BY MOUTH EVERY DAY Patient taking differently: Take 20 mg by mouth daily.  03/26/19   [name], MD  Insulin Pen Needle (PEN NEEDLES) 32G X 4 MM MISC Inject 1 pen into the skin daily. Use with Saxenda 12/05/18   [name], MD  Liraglutide -Weight Management (SAXENDA) 18 MG/3ML SOPN Inject 1.2 mg into the skin daily.    [provider]  pregabalin (LYRICA) 50 MG capsule Take 1 capsule (50 mg total) by mouth daily. 04/02/19   [name], MD  Allergies Patient has no known allergies.  Family History  Problem Relation Age of Onset  . Hyperlipidemia Father   . Diabetes Sister   . Diabetes Maternal Grandmother   . Cancer Maternal Grandmother        vaginal  . Cirrhosis Maternal Grandmother   . Colon polyps Maternal Grandmother   . Breast cancer Maternal Grandmother        Early 21's    Social History Social History   Tobacco Use  . Smoking status: Former Smoker    Packs/day: 1.00     Types: Cigarettes  . Smokeless tobacco: Never Used  Substance Use Topics  . Alcohol use: Yes    Alcohol/week: 0.0 standard drinks    Comment: rarely  . Drug use: No    Review of Systems Constitutional: No fever/chills Eyes: No visual changes. ENT: No sore throat. Cardiovascular: Denies chest pain. Respiratory: Denies shortness of breath. Gastrointestinal: Positive for abdominal pain, nausea.  Genitourinary: Negative for dysuria. Musculoskeletal: Negative for back pain. Skin: Negative for rash. Neurological: Negative for headaches, focal weakness or numbness.  ____________________________________________   PHYSICAL EXAM:  VITAL SIGNS: ED Triage Vitals [05/23/19 1527]  Enc Vitals Group     BP 133/78     Pulse Rate 89     Resp 18     Temp 98.2 F (36.8 C)     Temp src      SpO2 100 %     Weight 190 lb (86.2 kg)     Height 5\' 2"  (1.575 m)     Head Circumference      Peak Flow      Pain Score 9   Constitutional: Alert and oriented.  Eyes: Conjunctivae are normal.  ENT      Head: Normocephalic and atraumatic.      Nose: No congestion/rhinnorhea.      Mouth/Throat: Mucous membranes are moist.      Neck: No stridor. Hematological/Lymphatic/Immunilogical: No cervical lymphadenopathy. Cardiovascular: Normal rate, regular rhythm.  No murmurs, rubs, or gallops.  Respiratory: Normal respiratory effort without tachypnea nor retractions. Breath sounds are clear and equal bilaterally. No wheezes/rales/rhonchi. Gastrointestinal: Soft and tender to palpation in the right lower quadrant. No rebound. No guarding. Negative Rovsing.  Genitourinary: Deferred Musculoskeletal: Normal range of motion in all extremities. No lower extremity edema. Neurologic:  Normal speech and language. No gross focal neurologic deficits are appreciated.  Skin:  Skin is warm, dry and intact. No rash noted. Psychiatric: Mood and affect are normal. Speech and behavior are normal. Patient exhibits  appropriate insight and judgment.  ____________________________________________    LABS (pertinent positives/negatives)  Lipase 25 CMP wnl UA clear, 0-5 rbc and wbc CBC wbc 9.4, hgb 13.4, plt 358  ____________________________________________   EKG  None  ____________________________________________    RADIOLOGY  CT abd/pel Cystic structure with surrounding enhancement involving the uterine fundus. No other acute abnormality  ____________________________________________   PROCEDURES  Procedures  ____________________________________________   INITIAL IMPRESSION / ASSESSMENT AND PLAN / ED COURSE  Pertinent labs & imaging results that were available during my care of the patient were reviewed by me and considered in my medical decision making (see chart for details).   Patient presents to the emergency department today because of concerns for right lower quadrant abdominal pain.  Patient did not have a leukocytosis here.  Urine without concerning findings.  I did discuss with patient risk benefits of CT to evaluate for appendicitis.  She did choose to undergo CT which I  think is reasonable given her tenderness in the right lower quadrant.  CT did not show findings concerning for acute appendicitis although there was concern for a cystic structure in the uterine fundus.  Because of this an ultrasound was ordered.  ____________________________________________   FINAL CLINICAL IMPRESSION(S) / ED DIAGNOSES  Final diagnoses:  Pelvic pain     Note: This dictation was prepared with Dragon dictation. Any transcriptional errors that result from this process are unintentional     Nance Pear, MD 05/23/19 2020

## 2019-05-24 ENCOUNTER — Encounter: Payer: Self-pay | Admitting: Internal Medicine

## 2019-05-27 IMAGING — CT CT L SPINE W/O CM
3 of 5 series · 11 of 33 positions shown, 13 images · non-contrast
Comparison: Lumbar spine radiograph 12/28/2017

CLINICAL DATA: Right lower extremity pain and weakness. Prior
lumbar fusion.

EXAM:
CT LUMBAR SPINE WITHOUT CONTRAST
TECHNIQUE: Multidetector CT imaging of the lumbar spine was performed without
intravenous contrast administration. Multiplanar CT image
reconstructions were also generated.

[Series 4: l-spine 2.0 (person_name) (person_name) · axial · 0.35mm/px · z∈[+1062,+1248]mm · 4 of 135 slices shown, 5 images]
[im 21/135  soft-tissue]
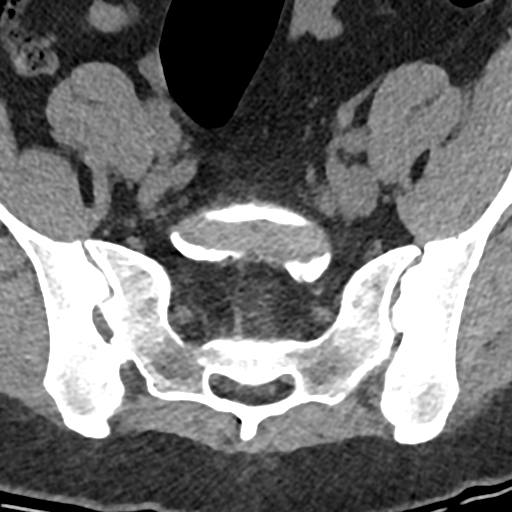
[im 21/135  bone]
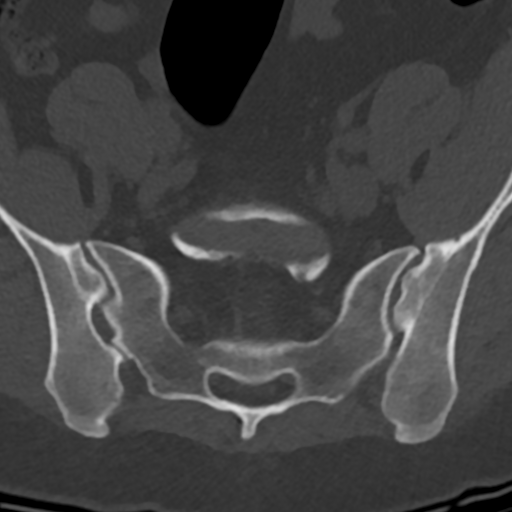
[im 52/135  bone]
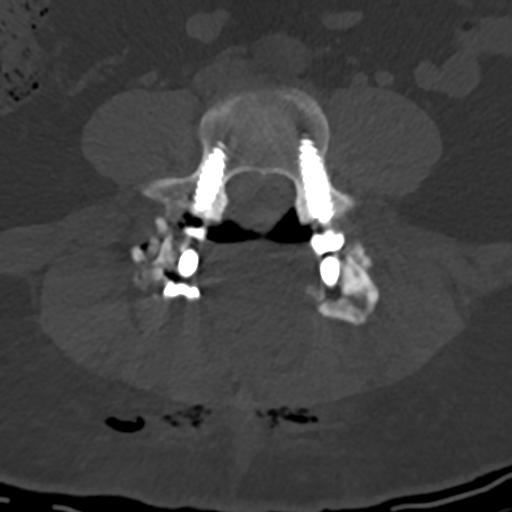
[im 83/135  bone]
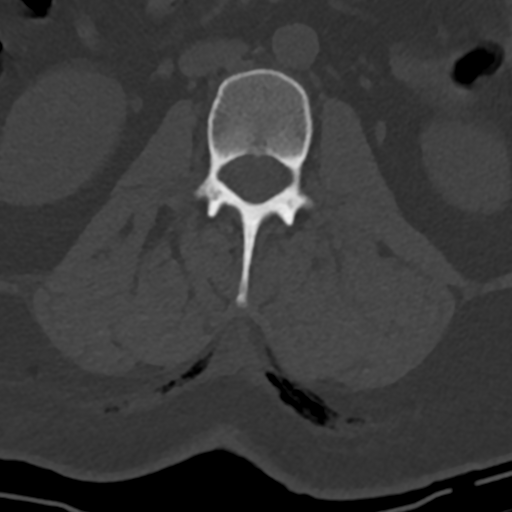
[im 114/135  bone]
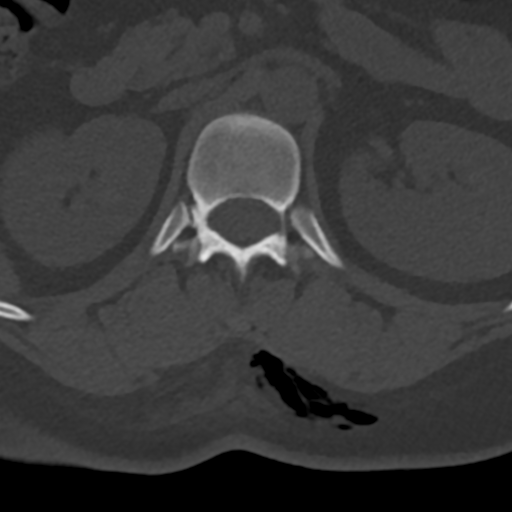

[Series 6: l-spine 2.0 cor bone · coronal · 0.39mm/px · 2 of 77 slices shown]
[im 26/77  bone]
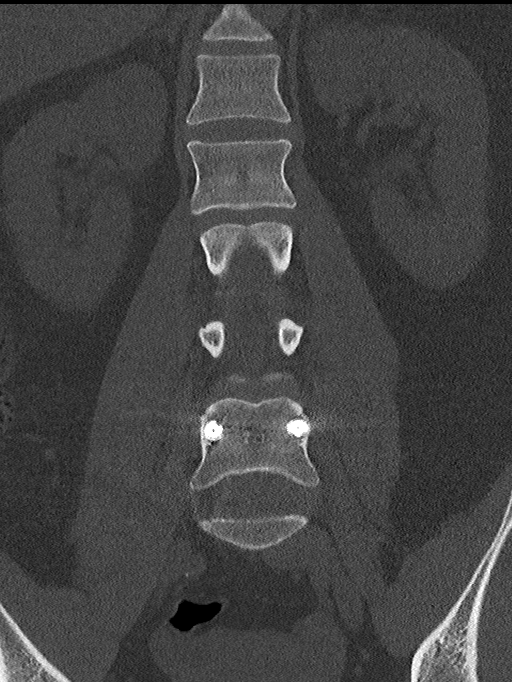
[im 51/77  bone]
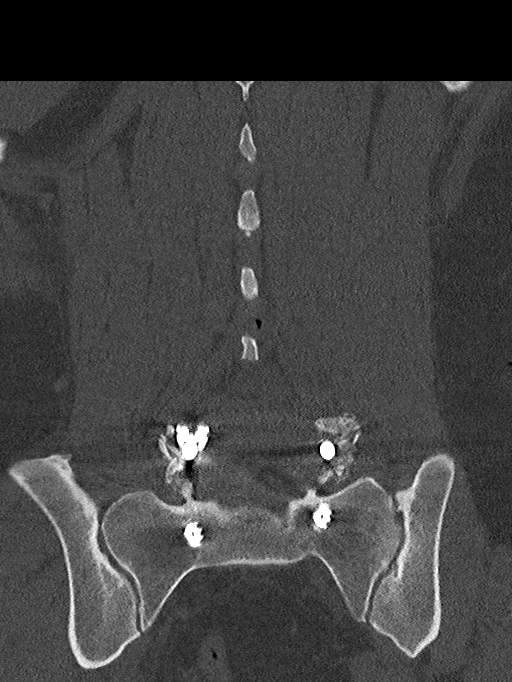

[Series 7: l-spine 2.0 sag bone · sagittal · 0.30mm/px · 5 of 102 slices shown, 6 images]
[im 34/102  bone]
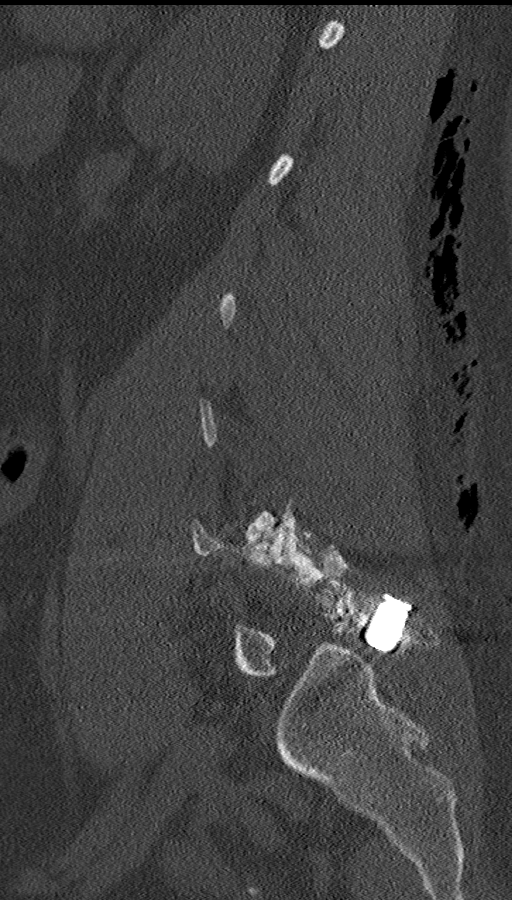
[im 43/102  bone]
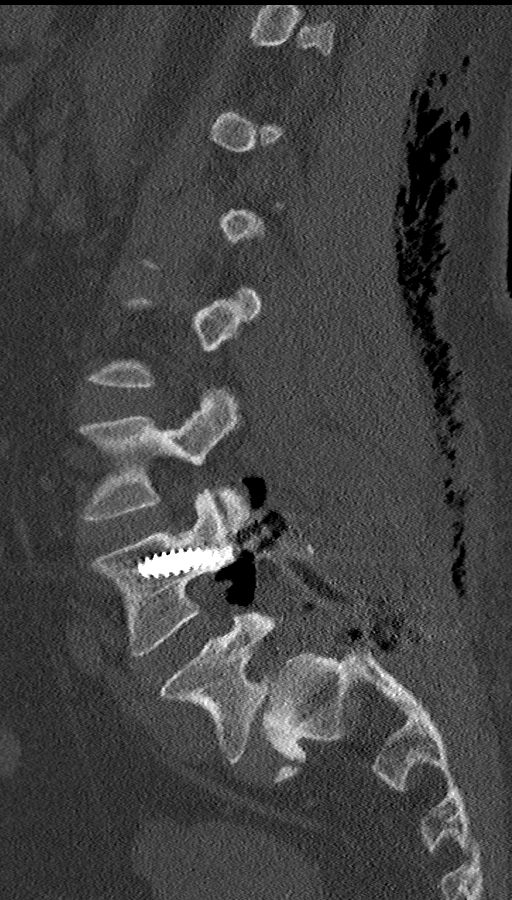
[im 51/102  soft-tissue]
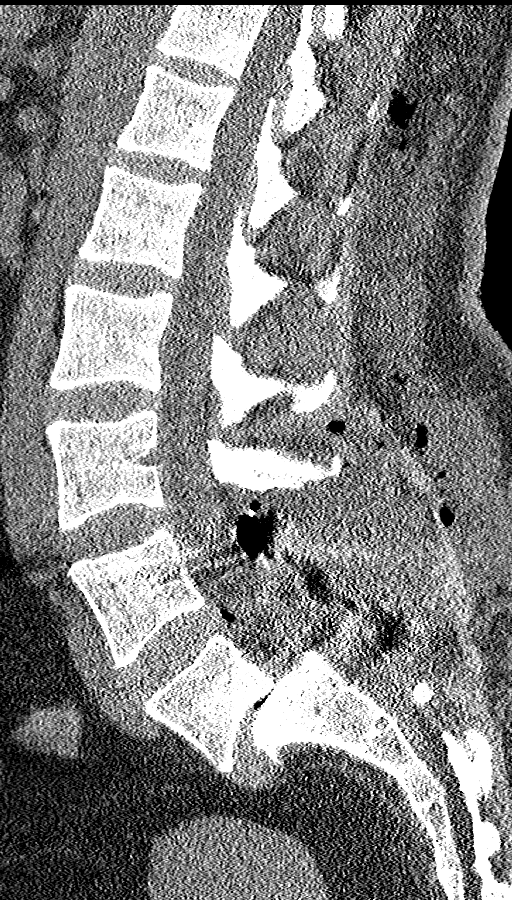
[im 51/102  bone]
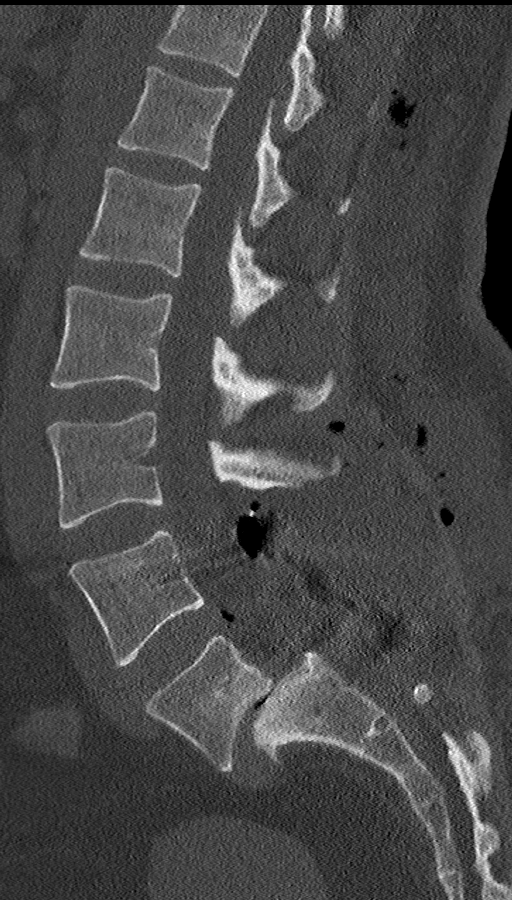
[im 59/102  bone]
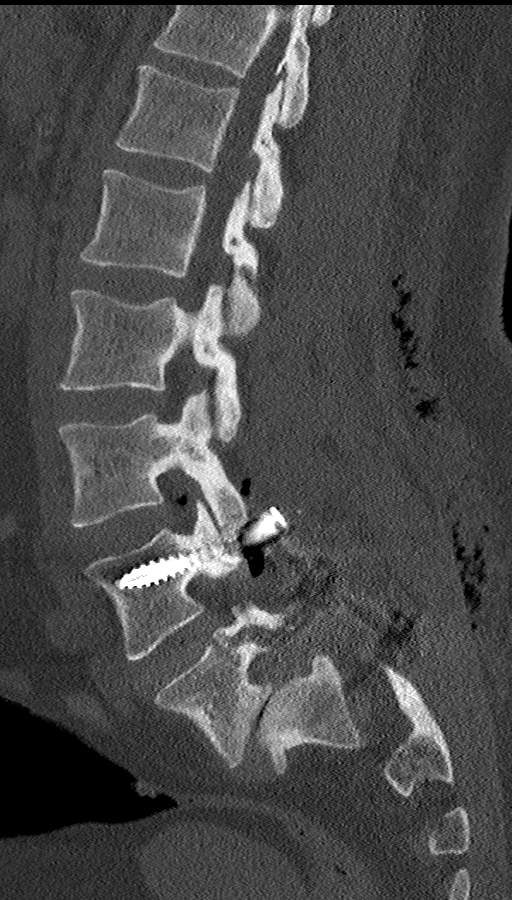
[im 68/102  bone]
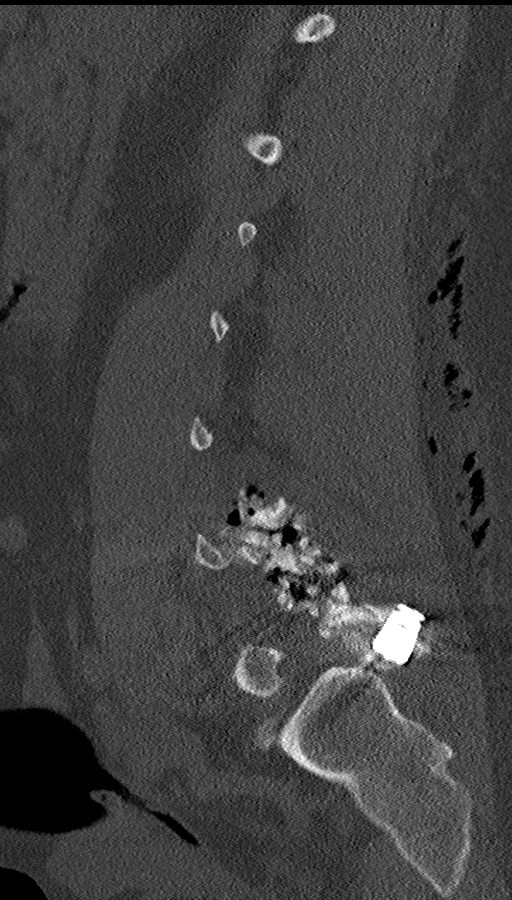

[11 of 33 positions shown; findings below may reference images not displayed]

FINDINGS: Segmentation: Normal

Alignment: Grade 3 anterolisthesis at L5-S1

Vertebrae: There is posterior spinal fusion hardware at L4-S1 with
right transpedicular screws at all levels and left transpedicular
screws at L4 and S1. There is no abnormal perihardware lucency.
There is not solid osseous fusion. No compression fracture.

Paraspinal and other soft tissues: Negative.

Disc levels: The levels above L3-4 normal.

There is a small amount of gas in the epidural space at the L3-4
level, which is otherwise normal.

L4-L5: Posterior decompression and postfusion changes with widely
patent spinal canal. There is postoperative gas extending into both
neural foramina, larger on the left than on the right.

L5-S1: There is bilateral severe neural foraminal stenosis at the
fused L5-S1 levels with greater impingement on the right relative to
the left. There is grade 3 anterolisthesis. The spinal canal has
been decompressed posteriorly.
IMPRESSION: 1. Postoperative changes a recent L4-S1 posterior spinal fusion.
2. Unchanged grade 3 L5-S1 anterolisthesis, resulting in severe
bilateral neural foraminal stenosis with right-greater-than-left
neural impingement.

## 2019-05-28 ENCOUNTER — Other Ambulatory Visit: Payer: Self-pay

## 2019-05-28 ENCOUNTER — Encounter: Payer: Self-pay | Admitting: Obstetrics & Gynecology

## 2019-05-28 ENCOUNTER — Ambulatory Visit (INDEPENDENT_AMBULATORY_CARE_PROVIDER_SITE_OTHER): Payer: BC Managed Care – PPO | Admitting: Obstetrics & Gynecology

## 2019-05-28 VITALS — BP 130/82 | Ht 62.0 in | Wt 199.0 lb

## 2019-05-28 DIAGNOSIS — D252 Subserosal leiomyoma of uterus: Secondary | ICD-10-CM

## 2019-05-28 DIAGNOSIS — R1031 Right lower quadrant pain: Secondary | ICD-10-CM | POA: Insufficient documentation

## 2019-05-28 DIAGNOSIS — R102 Pelvic and perineal pain: Secondary | ICD-10-CM | POA: Diagnosis not present

## 2019-05-28 DIAGNOSIS — D219 Benign neoplasm of connective and other soft tissue, unspecified: Secondary | ICD-10-CM | POA: Insufficient documentation

## 2019-05-28 DIAGNOSIS — N852 Hypertrophy of uterus: Secondary | ICD-10-CM | POA: Diagnosis not present

## 2019-05-28 MED ORDER — OXYCODONE-ACETAMINOPHEN 5-325 MG PO TABS: 1.0000 | ORAL_TABLET | Freq: Two times a day (BID) | ORAL | 0 refills | Status: DC | PRN

## 2019-05-28 NOTE — Patient Instructions (Signed)
Uterine Fibroids  Uterine fibroids (leiomyomas) are noncancerous (benign) tumors that can develop in the uterus. Fibroids may also develop in the fallopian tubes, cervix, or tissues (ligaments) near the uterus. You may have one or many fibroids. Fibroids vary in size, weight, and where they grow in the uterus. Some can become quite large. Most fibroids do not require medical treatment. What are the causes? The cause of this condition is not known. What increases the risk? You are more likely to develop this condition if you:  Are in your 30s or 40s and have not gone through menopause.  Have a family history of this condition.  Are of African-American descent.  Had your first period at an early age (early menarche).  Have not had any children (nulliparity).  Are overweight or obese. What are the signs or symptoms? Many women do not have any symptoms. Symptoms of this condition may include:  Heavy menstrual bleeding.  Bleeding or spotting between periods.  Pain and pressure in the pelvic area, between the hips.  Bladder problems, such as needing to urinate urgently or more often than usual.  Inability to have children (infertility).  Failure to carry pregnancy to term (miscarriage). How is this diagnosed? This condition may be diagnosed based on:  Your symptoms and medical history.  A physical exam.  A pelvic exam that includes feeling for any tumors.  Imaging tests, such as ultrasound or MRI. How is this treated? Treatment for this condition may include:  Seeing your health care provider for follow-up visits to monitor your fibroids for any changes.  Taking NSAIDs such as ibuprofen, naproxen, or aspirin to reduce pain.  Hormone medicines. These may be taken as a pill, given in an injection, or delivered by a T-shaped device that is inserted into the uterus (intrauterine device, IUD).  Surgery to remove one of the following: ? The fibroids (myomectomy). Your health  care provider may recommend this if fibroids affect your fertility and you want to become pregnant. ? The uterus (hysterectomy). ? Blood supply to the fibroids (uterine artery embolization). Follow these instructions at home:  Take over-the-counter and prescription medicines only as told by your health care provider.  Ask your health care provider if you should take iron pills or eat more iron-rich foods, such as dark green, leafy vegetables. Heavy menstrual bleeding can cause low iron levels.  If directed, apply heat to your back or abdomen to reduce pain. Use the heat source that your health care provider recommends, such as a moist heat pack or a heating pad. ? Place a towel between your skin and the heat source. ? Leave the heat on for 20-30 minutes. ? Remove the heat if your skin turns bright red. This is especially important if you are unable to feel pain, heat, or cold. You may have a greater risk of getting burned.  Pay close attention to your menstrual cycle. Tell your health care provider about any changes, such as: ? Increased blood flow that requires you to use more pads or tampons than usual. ? A change in the number of days that your period lasts. ? A change in symptoms that are associated with your period, such as back pain or cramps in your abdomen.  Keep all follow-up visits as told by your health care provider. This is important, especially if your fibroids need to be monitored for any changes. Contact a health care provider if you:  Have pelvic pain, back pain, or cramps in your abdomen that   do not get better with medicine or heat.  Develop new bleeding between periods.  Have increased bleeding during or between periods.  Feel unusually tired or weak.  Feel light-headed. Get help right away if you:  Faint.  Have pelvic pain that suddenly gets worse.  Have severe vaginal bleeding that soaks a tampon or pad in 30 minutes or less. Summary  Uterine fibroids are  noncancerous (benign) tumors that can develop in the uterus.  The exact cause of this condition is not known.  Most fibroids do not require medical treatment unless they affect your ability to have children (fertility).  Contact a health care provider if you have pelvic pain, back pain, or cramps in your abdomen that do not get better with medicines.  Make sure you know what symptoms should cause you to get help right away. This information is not intended to replace advice given to you by your health care provider. Make sure you discuss any questions you have with your health care provider. Document Revised: 03/04/2017 Document Reviewed: 02/15/2017 Elsevier Patient Education  2020 [employer]   Total Laparoscopic Hysterectomy A total laparoscopic hysterectomy is a minimally invasive surgery to remove the uterus and cervix. The fallopian tubes and ovaries can also be removed (bilateral salpingo-oophorectomy) during this surgery, if necessary. This procedure may be done to treat problems such as:  Noncancerous growths in the uterus (uterine fibroids) that cause symptoms.  A condition that causes the lining of the uterus (endometrium) to grow in other areas (endometriosis).  Problems with pelvic support. This is caused by weakened muscles of the pelvis following vaginal childbirth or menopause.  Cancer of the cervix, ovaries, uterus, or endometrium.  Excessive (dysfunctional) uterine bleeding. This surgery is performed by inserting a thin, lighted tube (laparoscope) and surgical instruments into small incisions in the abdomen. The laparoscope sends images to a monitor. The images help the health care provider perform the procedure. After this procedure, you will no longer be able to have a baby, and you will no longer have a menstrual period. Tell a health care provider about:  Any allergies you have.  All medicines you are taking, including vitamins, herbs, eye drops, creams, and  over-the-counter medicines.  Any problems you or family members have had with anesthetic medicines.  Any blood disorders you have.  Any surgeries you have had.  Any medical conditions you have.  Whether you are pregnant or may be pregnant. What are the risks? Generally, this is a safe procedure. However, problems may occur, including:  Infection.  Bleeding.  Blood clots in the legs or lungs.  Allergic reactions to medicines.  Damage to other structures or organs.  The risk that the surgery may have to be switched to the regular one in which a large incision is made in the abdomen (abdominal hysterectomy). What happens before the procedure? Staying hydrated Follow instructions from your health care provider about hydration, which may include:  Up to 2 hours before the procedure - you may continue to drink clear liquids, such as water, clear fruit juice, black coffee, and plain tea Eating and drinking restrictions Follow instructions from your health care provider about eating and drinking, which may include:  8 hours before the procedure - stop eating heavy meals or foods such as meat, fried foods, or fatty foods.  6 hours before the procedure - stop eating light meals or foods, such as toast or cereal.  6 hours before the procedure - stop drinking milk or drinks   that contain milk.  2 hours before the procedure - stop drinking clear liquids. Medicines  Ask your health care provider about: ? Changing or stopping your regular medicines. This is especially important if you are taking diabetes medicines or blood thinners. ? Taking over-the-counter medicines, vitamins, herbs, and supplements. ? Taking medicines such as aspirin and ibuprofen. These medicines can thin your blood. Do not take these medicines unless your health care provider tells you to take them.  You may be given antibiotic medicine to help prevent infection.  You may be asked to take laxatives.  You may  be given medicines to help prevent nausea and vomiting after the procedure. General instructions  Ask your health care provider how your surgical site will be marked or identified.  You may be asked to shower with a germ-killing soap.  Do not use any products that contain nicotine or tobacco, such as cigarettes and e-cigarettes. If you need help quitting, ask your health care provider.  You may have an exam or testing, such as an ultrasound to determine the size and shape of your pelvic organs.  You may have a blood or urine sample taken.  This procedure can affect the way you feel about yourself. Talk with your health care provider about the physical and emotional changes hysterectomy may cause.  Plan to have someone take you home from the hospital or clinic.  Plan to have a responsible adult care for you for at least 24 hours after you leave the hospital or clinic. This is important. What happens during the procedure?  To lower your risk of infection: ? Your health care team will wash or sanitize their hands. ? Your skin will be washed with soap. ? Hair may be removed from the surgical area.  An IV will be inserted into one of your veins.  You will be given one or more of the following: ? A medicine to help you relax (sedative). ? A medicine to make you fall asleep (general anesthetic).  You will be given antibiotic medicine through your IV.  A tube may be inserted down your throat to help you breathe during the procedure.  A gas (carbon dioxide) will be used to inflate your abdomen to allow your surgeon to see inside of your abdomen.  Three or four small incisions will be made in your abdomen.  A laparoscope will be inserted into one of your incisions. Surgical instruments will be inserted through the other incisions in order to perform the procedure.  Your uterus and cervix may be removed through your vagina or cut into small pieces and removed through the small  incisions. Any other organs that need to be removed will also be removed this way.  Carbon dioxide will be released from inside of your abdomen.  Your incisions will be closed with stitches (sutures).  A bandage (dressing) may be placed over your incisions. The procedure may vary among health care providers and hospitals. What happens after the procedure?  Your blood pressure, heart rate, breathing rate, and blood oxygen level will be monitored until the medicines you were given have worn off.  You will be given medicine for pain and nausea as needed.  Do not drive for 24 hours if you received a sedative. Summary  Total Laparoscopic hysterectomy is a procedure to remove your uterus, cervix and sometimes the fallopian tubes and ovaries.  This procedure can affect the way you feel about yourself. Talk with your health care provider about the physical   and emotional changes hysterectomy may cause.  After this procedure, you will no longer be able to have a baby, and you will no longer have a menstrual period.  You will be given pain medicine to control discomfort after this procedure. This information is not intended to replace advice given to you by your health care provider. Make sure you discuss any questions you have with your health care provider. Document Revised: 03/04/2017 Document Reviewed: 06/02/2016 Elsevier Patient Education  2020 [employer].  

## 2019-05-28 NOTE — Progress Notes (Signed)
Uterine Fibroids Patient is a 44 yo G3 P1203 WF who presents with uterine fibroids. Periods are spotty due to ablation but monthly, and have been more crampy the last 2 months with also more bleeding noted than usual.  Then last week she experienced more severe lower pelvic pains, R>L, no radiation, requiring Percocet at times to help w pain, no assoc sx's.  No bleeding.  She does report urinary freq and urgency over the last 2 weeks.  [place] last week Measurements: 8.5 x 5.1 x 5.1 cm = volume: 117 mL. Anterior subserosal uterine mass measuring 2.7 x 1.9 x 1.9 cm consistent with fibroid. Small hypoechoic slightly cystic appearing left posterior subserosal mass measuring 1.5 x 1 x 1 cm, likely small fibroid. Mostly cystic mass within the central fundal portion of the uterus measuring 2.9 x 2.1 x 2.1 cm. Contains internal echoes. This is avascular. Endometrium Thickness: 2.7 mm.  No focal abnormality visualized  Prior PAP normal 11/2018 Prior CS x3 Prior BTL Prior Ablation 2011  Pt is a Pharmacist, hospital at [employer]  PMHx: She  has a past medical history of Anemia, Anxiety, Arthritis, Frequent headaches, GERD (gastroesophageal reflux disease), Hypercholesterolemia, and Placenta previa. Also,  has a past surgical history that includes Cesarean section (2003 & 2010); Cholecystectomy (2005); Lumbar laminectomy/decompression microdiscectomy (N/A, 12/28/2017); Anterior lumbar fusion (N/A, 01/01/2018); Abdominal exposure (N/A, 01/01/2018); and Breast biopsy (Right, 05/10/2017)., family history includes Breast cancer in her maternal grandmother; Cancer in her maternal grandmother; Cirrhosis in her maternal grandmother; Colon polyps in her maternal grandmother; Diabetes in her maternal grandmother and sister; Hyperlipidemia in her father.,  reports that she has quit smoking. Her smoking use included cigarettes. She smoked 1.00 pack per day. She has never used smokeless tobacco. She reports current alcohol use. She reports that  she does not use drugs.  She has a current medication list which includes the following prescription(s): escitalopram, pen needles, saxenda, pregabalin, acyclovir cream, alprazolam, and oxycodone-acetaminophen. Also, has No Known Allergies.  Review of Systems  Constitutional: Negative for chills, fever and malaise/fatigue.  HENT: Negative for congestion, sinus pain and sore throat.   Eyes: Negative for blurred vision and pain.  Respiratory: Negative for cough and wheezing.   Cardiovascular: Negative for chest pain and leg swelling.  Gastrointestinal: Positive for abdominal pain. Negative for constipation, diarrhea, heartburn, nausea and vomiting.  Genitourinary: Positive for frequency. Negative for dysuria and hematuria.  Musculoskeletal: Negative for back pain, joint pain, myalgias and neck pain.  Skin: Negative for itching and rash.  Neurological: Negative for dizziness, tremors and weakness.  Endo/Heme/Allergies: Does not bruise/bleed easily.  Psychiatric/Behavioral: Negative for depression. The patient is not nervous/anxious and does not have insomnia.     Objective: BP 130/82   Ht 5\' 2"  (1.575 m)   Wt 199 lb (90.3 kg)   BMI 36.40 kg/m  Physical Exam Constitutional:      General: She is not in acute distress.    Appearance: She is well-developed.  Genitourinary:     Pelvic exam was performed with patient supine.     Urethra, bladder and vagina normal.     No vaginal erythema or bleeding.     No cervical motion tenderness, discharge, polyp or nabothian cyst.     Uterus is enlarged and mobile.     No uterine mass detected.    Uterus is anteverted.     No right or left adnexal mass present.     Right adnexa not tender.     Left  adnexa not tender.     Genitourinary Comments: Anterior fibroid palpated 8 weeks sized uterus  HENT:     Head: Normocephalic and atraumatic.     Nose: Nose normal.  Abdominal:     General: There is no distension.     Palpations: Abdomen is soft.      Tenderness: There is no abdominal tenderness.  Musculoskeletal:        General: Normal range of motion.  Neurological:     Mental Status: She is alert and oriented to person, place, and time.     Cranial Nerves: No cranial nerve deficit.  Skin:    General: Skin is warm and dry.  Psychiatric:        Attention and Perception: Attention normal.        Mood and Affect: Mood and affect normal.        Speech: Speech normal.        Behavior: Behavior normal.        Thought Content: Thought content normal.        Judgment: Judgment normal.    ASSESSMENT/PLAN:   Problem List Items Addressed This Visit      Other   Fibroid - Primary   RLQ abdominal pain    Fibroid treatment such as [place], Lupron, Myomectomy, and Hysterectomy discussed in detail, with the pros and cons of each choice counseled.  No treatment as an option also discussed, as well as control of symptoms alone with hormone therapy. Information provided to the patient.  [place] in 6 mos if conservative therapy followed New onset diagnosis, but pain may lead to intervention sooner than later Percocet one time for bad days.  IBF instructed as well.  A total of 45 minutes were spent face-to-face with the patient as well as preparation, review, communication, and documentation during this encounter.   Barnett Applebaum, MD, [facility], [facility] 05/28/2019  10:36 AM

## 2019-06-01 ENCOUNTER — Ambulatory Visit (INDEPENDENT_AMBULATORY_CARE_PROVIDER_SITE_OTHER): Payer: BC Managed Care – PPO | Admitting: Internal Medicine

## 2019-06-01 ENCOUNTER — Other Ambulatory Visit: Payer: Self-pay

## 2019-06-01 DIAGNOSIS — R634 Abnormal weight loss: Secondary | ICD-10-CM

## 2019-06-01 DIAGNOSIS — F411 Generalized anxiety disorder: Secondary | ICD-10-CM | POA: Diagnosis not present

## 2019-06-01 DIAGNOSIS — K219 Gastro-esophageal reflux disease without esophagitis: Secondary | ICD-10-CM

## 2019-06-01 DIAGNOSIS — D219 Benign neoplasm of connective and other soft tissue, unspecified: Secondary | ICD-10-CM

## 2019-06-01 DIAGNOSIS — E7800 Pure hypercholesterolemia, unspecified: Secondary | ICD-10-CM | POA: Diagnosis not present

## 2019-06-01 NOTE — Progress Notes (Signed)
Patient ID: Ariel Morgan, female   DOB: [birth year], 44 y.o.   MRN: [id number]   Virtual Visit via video Note  This visit type was conducted due to national recommendations for restrictions regarding the COVID-19 pandemic (e.g. social distancing).  This format is felt to be most appropriate for this patient at this time.  All issues noted in this document were discussed and addressed.  No physical exam was performed (except for noted visual exam findings with Video Visits).   I connected with Thresa Ross today by a video enabled telemedicine application and verified that I am speaking with the correct person using two identifiers. Location patient: home Location provider: work  Persons participating in the virtual visit: patient, provider  The limitations, risks, security and privacy concerns of performing an evaluation and management service by video and the availability of in person appointments have been discussed. The patient expressed understanding and agreed to proceed.   Reason for visit: scheduled follow up.   HPI: Was seen in ER 05/23/19 - abdominal pain.  CT scan - cystic structure in the uterine fundus.  Saw gyn - fibroids.  Discussed fibroid treatment.  Plans to monitor and plan f/u ultrasound.  Pain has improved.  Has percocet if needed.  Was on saxenda.  With above - on hold.  Increased stress.  Father recently passed.  Trying to get guardianship of her mother.  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  Discussed with her today.  She is on lexapro.  Discussed psychiatry referral. She is in agreement.   Reflux appears to be controlled.  No increased cough or congestion reported.  Also reports some trouble focusing, etc. May be related to increased stress, being overwhelmed,etc.  Plan psychiatry referral as outlined.     ROS: See pertinent positives and negatives per HPI.  Past Medical History:  Diagnosis Date  . Anemia   . Anxiety   . Arthritis   . Frequent headaches    H/O  . GERD (gastroesophageal reflux disease)   . Hypercholesterolemia   . Placenta previa     Past Surgical History:  Procedure Laterality Date  . ABDOMINAL EXPOSURE N/A 01/01/2018   Procedure: ABDOMINAL EXPOSURE;  Surgeon: Marty Heck, MD;  Location: [facility];  Service: Vascular;  Laterality: N/A;  . ANTERIOR LUMBAR FUSION N/A 01/01/2018   Procedure: ANTERIOR LUMBAR FUSION L5-S1; REPLACEMENT OF posterior HARDWARE L4-S1;  Surgeon: Melina Schools, MD;  Location: [facility];  Service: Orthopedics;  Laterality: N/A;  . BREAST BIOPSY Right 05/10/2017   right breast stereotatic bx path pending  . CESAREAN SECTION  2003 & 2010  . CHOLECYSTECTOMY  2005  . LUMBAR LAMINECTOMY/DECOMPRESSION MICRODISCECTOMY N/A 12/28/2017   Procedure: L4-S1 decompression and fusion;  Surgeon: Melina Schools, MD;  Location: [facility];  Service: Orthopedics;  Laterality: N/A;  4.5 hrs    Family History  Problem Relation Age of Onset  . Hyperlipidemia Father   . Diabetes Sister   . Diabetes Maternal Grandmother   . Cancer Maternal Grandmother        vaginal  . Cirrhosis Maternal Grandmother   . Colon polyps Maternal Grandmother   . Breast cancer Maternal Grandmother        Early 32's    SOCIAL HX: reviewed.    Current Outpatient Medications:  .  acyclovir cream (ZOVIRAX) 5 %, Apply to affected area qid. (Patient not taking: Reported on 05/28/2019), Disp: 15 g, Rfl: 0 .  ALPRAZolam (XANAX) 0.25 MG tablet, Take 1 tablet (  0.25 mg total) by mouth daily as needed for anxiety. (Patient not taking: Reported on 05/28/2019), Disp: 20 tablet, Rfl: 0 .  escitalopram (LEXAPRO) 20 MG tablet, TAKE 1 TABLET BY MOUTH EVERY DAY (Patient taking differently: Take 20 mg by mouth daily. ), Disp: 90 tablet, Rfl: 0 .  Insulin Pen Needle (PEN NEEDLES) 32G X 4 MM MISC, Inject 1 pen into the skin daily. Use with Saxenda, Disp: 100 each, Rfl: 1 .  Liraglutide -Weight Management (SAXENDA) 18 MG/3ML SOPN, Inject 1.2 mg into the skin daily.,  Disp: , Rfl:  .  oxyCODONE-acetaminophen (PERCOCET) 5-325 MG tablet, Take 1 tablet by mouth 2 (two) times daily as needed., Disp: 20 tablet, Rfl: 0 .  pregabalin (LYRICA) 50 MG capsule, TAKE 1 CAPSULE BY MOUTH EVERY DAY, Disp: 30 capsule, Rfl: 1  EXAM:  GENERAL: alert, oriented, appears well and in no acute distress  HEENT: atraumatic, conjunttiva clear, no obvious abnormalities on inspection of external nose and ears  NECK: normal movements of the head and neck  LUNGS: on inspection no signs of respiratory distress, breathing rate appears normal, no obvious gross SOB, gasping or wheezing  CV: no obvious cyanosis  PSYCH/NEURO: pleasant and cooperative, no obvious depression or anxiety, speech and thought processing grossly intact  ASSESSMENT AND PLAN:  Discussed the following assessment and plan:  Fibroid Increased pain recently.  Evaluated in ER as outlined.  Saw gyn.  Planning to f/u with gyn.  Pain is better.    GAD (generalized anxiety disorder) On lexapro.  Increased stress as outlined.  Discussed with her today.  Agrees with psychiatry referral.    GERD (gastroesophageal reflux disease) Controlled.    Hypercholesterolemia Low cholesterol diet and exercise.  Follow lipid panel.   Weight loss Was doing well with saxenda.  Increased stress recently as outlined. Plans to get back on saxenda.  Follow.     Orders Placed This Encounter  Procedures  . Ambulatory referral to Psychiatry    Referral Priority:   Routine    Referral Type:   Psychiatric    Referral Reason:   Specialty Services Required    Requested Specialty:   Psychiatry    Number of Visits Requested:   1     I discussed the assessment and treatment plan with the patient. The patient was provided an opportunity to ask questions and all were answered. The patient agreed with the plan and demonstrated an understanding of the instructions.   The patient was advised to call back or seek an in-person evaluation  if the symptoms worsen or if the condition fails to improve as anticipated.    [name], MD

## 2019-06-02 ENCOUNTER — Other Ambulatory Visit: Payer: Self-pay | Admitting: Internal Medicine

## 2019-06-10 ENCOUNTER — Encounter: Payer: Self-pay | Admitting: Internal Medicine

## 2019-06-10 NOTE — Assessment & Plan Note (Signed)
Increased pain recently.  Evaluated in ER as outlined.  Saw gyn.  Planning to f/u with gyn.  Pain is better.

## 2019-06-10 NOTE — Assessment & Plan Note (Signed)
Was doing well with saxenda.  Increased stress recently as outlined. Plans to get back on saxenda.  Follow.

## 2019-06-10 NOTE — Assessment & Plan Note (Signed)
Controlled.  

## 2019-06-10 NOTE — Assessment & Plan Note (Signed)
Low cholesterol diet and exercise.  Follow lipid panel.   

## 2019-06-10 NOTE — Assessment & Plan Note (Signed)
On lexapro.  Increased stress as outlined.  Discussed with her today.  Agrees with psychiatry referral.

## 2019-06-14 ENCOUNTER — Telehealth: Payer: Self-pay | Admitting: Internal Medicine

## 2019-06-14 NOTE — Chronic Care Management (AMB) (Signed)
  Care Management   Note  06/14/2019 Name: Ariel Morgan MRN: [id number] DOB: [birth year]  Ariel Morgan is a 44 y.o. year old female who is a primary care patient of [name], MD and is actively engaged with the care management team. I reached out to [name] by phone today to assist with scheduling a follow up visit with the Pharmacist  Follow up plan: Unsuccessful telephone outreach attempt made. A HIPPA compliant phone message was left for the patient providing contact information and requesting a return call.  The care management team will reach out to the patient again over the next 7 days.  If patient returns call to provider office, please advise to call [facility]  at [place], [place], [place], [place] [address] Direct Dial: [phone] [email or web address]: [email or web address]

## 2019-06-18 NOTE — Chronic Care Management (AMB) (Signed)
  Care Management   Note  06/18/2019 Name: Ariel Morgan MRN: [id number] DOB: 1975-10-25  Ariel Morgan is a 44 y.o. year old female who is a primary care patient of [name], MD and is actively engaged with the care management team. I reached out to [name] by phone today to assist with scheduling a follow up visit with the Pharmacist  Follow up plan: Second Unsuccessful telephone outreach attempt made. A HIPPA compliant phone message was left for the patient providing contact information and requesting a return call.  The care management team will reach out to the patient again over the next 7 days.  If patient returns call to provider office, please advise to call [facility]  at [place], [place], [place], [place] [address] Direct Dial: [phone] [email or web address]: [email or web address]

## 2019-06-19 NOTE — Chronic Care Management (AMB) (Signed)
  Care Management   Note  06/19/2019 Name: Ariel Morgan MRN: [id number] DOB: 1975-09-30  Ariel Morgan is a 44 y.o. year old female who is a primary care patient of [name], MD and is actively engaged with the care management team. I reached out to Ariel Morgan by phone today to assist with scheduling a follow up visit with the Pharmacist  Follow up plan: Telephone appointment with care management team member scheduled for:07/12/2019  Ariel Morgan, [place], [place], [place] [address] Direct Dial: [phone] [email or web address]: [email or web address]

## 2019-06-25 ENCOUNTER — Telehealth (INDEPENDENT_AMBULATORY_CARE_PROVIDER_SITE_OTHER): Payer: BC Managed Care – PPO | Admitting: Obstetrics & Gynecology

## 2019-06-25 ENCOUNTER — Encounter: Payer: Self-pay | Admitting: Obstetrics & Gynecology

## 2019-06-25 ENCOUNTER — Other Ambulatory Visit: Payer: Self-pay

## 2019-06-25 DIAGNOSIS — D259 Leiomyoma of uterus, unspecified: Secondary | ICD-10-CM | POA: Diagnosis not present

## 2019-06-25 DIAGNOSIS — R102 Pelvic and perineal pain: Secondary | ICD-10-CM | POA: Diagnosis not present

## 2019-06-25 DIAGNOSIS — D219 Benign neoplasm of connective and other soft tissue, unspecified: Secondary | ICD-10-CM

## 2019-06-25 NOTE — Progress Notes (Signed)
Virtual Visit via Telephone Note  I connected with Ariel Morgan on 06/25/19 at  4:10 PM EDT by telephone and verified that I am speaking with the correct person using two identifiers.   I discussed the limitations, risks, security and privacy concerns of performing an evaluation and management service by telephone and the availability of in person appointments. I also discussed with the patient that there may be a patient responsible charge related to this service. The patient expressed understanding and agreed to proceed. She was at home and I was in my office.  History of Present Illness: Ariel Morgan is a 44 y.o. who was found to have fibroids by exam and [place] last month after having a more painful month in the pelvis area than usual; prior ablation so no significant bleeding concerns (monthly scant bleeding).  Pain was R>L and lower pelvis without radiation and requiring Percocet at times to alleviate pain.  Since that time, she states that her symptoms are improving.   PMHx: She  has a past medical history of Anemia, Anxiety, Arthritis, Frequent headaches, GERD (gastroesophageal reflux disease), Hypercholesterolemia, and Placenta previa. Also,  has a past surgical history that includes Cesarean section (2003 & 2010); Cholecystectomy (2005); Lumbar laminectomy/decompression microdiscectomy (N/A, 12/28/2017); Anterior lumbar fusion (N/A, 01/01/2018); Abdominal exposure (N/A, 01/01/2018); and Breast biopsy (Right, 05/10/2017)., family history includes Breast cancer in her maternal grandmother; Cancer in her maternal grandmother; Cirrhosis in her maternal grandmother; Colon polyps in her maternal grandmother; Diabetes in her maternal grandmother and sister; Hyperlipidemia in her father.,  reports that she has quit smoking. Her smoking use included cigarettes. She smoked 1.00 pack per day. She has never used smokeless tobacco. She reports current alcohol use. She reports that she does not use  drugs. No outpatient medications have been marked as taking for the 06/25/19 encounter (Video Visit) with Gae Dry, MD.  . Also, has No Known Allergies..  Review of Systems  All other systems reviewed and are negative.   Observations/Objective: No exam today, due to telephone eVisit due to [facility] virus restriction on elective visits and procedures.  Prior visits reviewed along with ultrasounds/labs as indicated.  Assessment and Plan:   ICD-10-CM   1. Fibroid  D21.9   As pain symptoms have improved, and she has little bleeding, will observe fibroids and for symptoms.  Info provided.  Follow Up Instructions: PRN, and in 5 mos for [place] check on fibroids   I discussed the assessment and treatment plan with the patient. The patient was provided an opportunity to ask questions and all were answered. The patient agreed with the plan and demonstrated an understanding of the instructions.   The patient was advised to call back or seek an in-person evaluation if the symptoms worsen or if the condition fails to improve as anticipated.  I provided 14 minutes of non-face-to-face time during this encounter.   Hoyt Koch, MD

## 2019-06-26 ENCOUNTER — Telehealth: Payer: Self-pay | Admitting: Obstetrics & Gynecology

## 2019-06-26 NOTE — Telephone Encounter (Signed)
-----   Message from Gae Dry, MD sent at 06/25/2019  4:22 PM EDT ----- Regarding: Sch f/u appt w Gyn [place] in August plz

## 2019-06-26 NOTE — Telephone Encounter (Signed)
Called and left voice mail for patient to call back to be schedule °

## 2019-06-27 ENCOUNTER — Encounter: Payer: Self-pay | Admitting: Internal Medicine

## 2019-07-01 ENCOUNTER — Other Ambulatory Visit: Payer: Self-pay | Admitting: Internal Medicine

## 2019-07-12 ENCOUNTER — Ambulatory Visit: Payer: Self-pay | Admitting: Pharmacist

## 2019-07-12 ENCOUNTER — Telehealth: Payer: BC Managed Care – PPO

## 2019-07-12 NOTE — Chronic Care Management (AMB) (Signed)
  Chronic Care Management   Note  07/12/2019 Name: Ariel Morgan MRN: [id number] DOB: [birth year]  Ariel Morgan Resides is a 44 y.o. year old female who is a primary care patient of [name], MD. The CCM team was consulted for assistance with chronic disease management and care coordination needs.    Attempted to contact patient for medication management review. Left HIPAA compliant message for patient to return my call at their convenience.   Plan: - Will collaborate with Care Guide to outreach to schedule follow up with me  Catie [name], PharmD, [place], [place] Pharmacist [place] [phone]

## 2019-07-13 ENCOUNTER — Telehealth: Payer: Self-pay | Admitting: Internal Medicine

## 2019-07-13 NOTE — Chronic Care Management (AMB) (Signed)
  Care Management   Note  07/13/2019 Name: Ariel Morgan MRN: [id number] DOB: 05-04-1975  Ariel Morgan is a 44 y.o. year old female who is a primary care patient of [name], MD and is actively engaged with the care management team. I reached out to Ariel Morgan by phone today to assist with re-scheduling a follow up visit with the Pharmacist  Follow up plan: Unsuccessful telephone outreach attempt made. A HIPPA compliant phone message was left for the patient providing contact information and requesting a return call.  The care management team will reach out to the patient again over the next 7 days.  If patient returns call to provider office, please advise to call [facility]  at [place], [place], [place], [place] [address] Direct Dial: [phone] [email or web address]: [email or web address]

## 2019-07-13 NOTE — Telephone Encounter (Signed)
I also called pt and left a vm to call Dr Nicolasa Ducking ofc to sch.

## 2019-07-13 NOTE — Progress Notes (Signed)
Reviewed.  Agree with plan   Dr Shawnelle Spoerl 

## 2019-07-14 ENCOUNTER — Other Ambulatory Visit: Payer: Self-pay | Admitting: Internal Medicine

## 2019-07-16 NOTE — Chronic Care Management (AMB) (Signed)
  Care Management   Note  07/16/2019 Name: Ariel Morgan MRN: [id number] DOB: January 28, 1976  Ariel Morgan is a 44 y.o. year old female who is a primary care patient of [name], MD and is actively engaged with the care management team. I reached out to [name] by phone today to assist with re-scheduling a follow up visit with the Pharmacist  Follow up plan: Telephone appointment with care management team member scheduled for:08/06/2019  [name], [place], [place], [place] [address] Direct Dial: [phone] [email or web address] Website: [email or web address]

## 2019-07-31 ENCOUNTER — Other Ambulatory Visit: Payer: Self-pay | Admitting: Internal Medicine

## 2019-08-01 ENCOUNTER — Other Ambulatory Visit: Payer: Self-pay | Admitting: Obstetrics & Gynecology

## 2019-08-01 MED ORDER — OXYCODONE-ACETAMINOPHEN 5-325 MG PO TABS: 1.0000 | ORAL_TABLET | ORAL | 0 refills | Status: DC | PRN

## 2019-08-01 NOTE — Telephone Encounter (Signed)
Refill request for lyrica, last seen 07-12-19, last filled 06-05-19.  Please advise.

## 2019-08-02 NOTE — Telephone Encounter (Signed)
rx ok'd for lyrica #[id number] with one refill.

## 2019-08-06 ENCOUNTER — Ambulatory Visit: Payer: BC Managed Care – PPO | Admitting: Pharmacist

## 2019-08-06 DIAGNOSIS — Z713 Dietary counseling and surveillance: Secondary | ICD-10-CM

## 2019-08-06 DIAGNOSIS — R635 Abnormal weight gain: Secondary | ICD-10-CM

## 2019-08-06 MED ORDER — SAXENDA 18 MG/3ML ~~LOC~~ SOPN: 1.8000 mg | PEN_INJECTOR | Freq: Every day | SUBCUTANEOUS | 2 refills | Status: DC

## 2019-08-06 NOTE — Chronic Care Management (AMB) (Signed)
Chronic Care Management   Follow Up Note   08/06/2019 Name: RENAYE MELLIES MRN: [id number] DOB: 09-11-1975  Referred by: [name], MD Reason for referral : Chronic Care Management (Medication Management)   GRACIOUS LIEBENOW is a 44 y.o. year old female who is a primary care patient of [name], MD. The CCM team was consulted for assistance with chronic disease management and care coordination needs.    Contacted patient for medication management review.   Review of patient status, including review of consultants reports, relevant laboratory and other test results, and collaboration with appropriate care team members and the patient's provider was performed as part of comprehensive patient evaluation and provision of chronic care management services.    SDOH (Social Determinants of Health) assessments performed: No See Care Plan activities for detailed interventions related to [facility])     Outpatient Encounter Medications as of 08/06/2019  Medication Sig  . Liraglutide -Weight Management (SAXENDA) 18 MG/3ML SOPN Inject 0.3 mLs (1.8 mg total) into the skin daily.  . [DISCONTINUED] SAXENDA 18 MG/3ML SOPN INJECT 0.6 MG INTO THE SKIN DAILY FOR 7 DAYS, THEN 1.2 MG DAILY.  Marland Kitchen acyclovir cream (ZOVIRAX) 5 % Apply to affected area qid. (Patient not taking: Reported on 05/28/2019)  . ALPRAZolam (XANAX) 0.25 MG tablet Take 1 tablet (0.25 mg total) by mouth daily as needed for anxiety. (Patient not taking: Reported on 05/28/2019)  . escitalopram (LEXAPRO) 20 MG tablet TAKE 1 TABLET BY MOUTH EVERY DAY  . Insulin Pen Needle (PEN NEEDLES) 32G X 4 MM MISC Inject 1 pen into the skin daily. Use with Saxenda  . oxyCODONE-acetaminophen (PERCOCET/ROXICET) 5-325 MG tablet Take 1 tablet by mouth every 4 (four) hours as needed for severe pain.  . pregabalin (LYRICA) 50 MG capsule TAKE 1 CAPSULE BY MOUTH EVERY DAY   No facility-administered encounter medications on file as of 08/06/2019.      Objective:   Goals Addressed            This Visit's Progress     Patient Stated   . "I'm frustrated that I can't lose any weight" (pt-stated)       CARE PLAN ENTRY (see longtitudinal plan of care for additional care plan information)  Current Barriers:  . Obesity; started on Saxenda for weight loss; notes that she went off for a few weeks when her father passed away suddenly in 2022-05-25, but has been back on for ~5 weeks . Current pharmacotherapy: Saxenda 1.2 mg daily . Current exercise: notes she has not been exercising on the treadmill lately, is exhausted when she finally gets done for the evening . 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 Current meal patterns:  o Breakfast: banana, sometimes school foods (donuts, muffins) o Lunch: sandwich,  o Snacks: notes that she will snack poorly when she gets home o Dinner: meat, vegetable, sometimes pastas, lasagnas, casseroles o Drinks: tries to drink water, but bathroom break time is limited; sometimes diet drinks; sweet tea   Pharmacist Clinical Goal(s):  Marland Kitchen Over the next 90 days, patient with work with PharmD and primary care provider to address weight loss  Interventions: . Comprehensive medication review performed, medication list updated in electronic medical record . Inter-disciplinary care team collaboration (see longitudinal plan of care) . Increase Saxenda to 1.8 mg daily.  Marland Kitchen Extensive dietary counseling. Encouraged to choose protein options for breakfast and lunch especially, so that she  isn't hungry and snacking before dinner. Suggested quick meals like protein shakes that she can drink on the way to work.  . Encouraged to exercise, even if starting with small goals such as 10-15 minutes 2-3 days weekly. She verbalized understanding.  Patient Self Care Activities:  . Patient will continue to focus on healthy dietary and exercise  choices. . Patient will report any questions or concerns to provider   Please see past updates related to this goal by clicking on the "Past Updates" button in the selected goal          Plan:  - Patient has PCP f/u in ~ 4 weeks, scheduled f/u with me ~4 weeks later  Catie [name], PharmD, [place], [place] Pharmacist [place] [phone]

## 2019-08-06 NOTE — Progress Notes (Signed)
Reviewed information.  Agree with plan.    Dr Annastasia Haskins 

## 2019-08-06 NOTE — Patient Instructions (Signed)
Visit Information  Goals Addressed            This Visit's Progress     Patient Stated   . "I'm frustrated that I can't lose any weight" (pt-stated)       CARE PLAN ENTRY (see longtitudinal plan of care for additional care plan information)  Current Barriers:  . Obesity; started on Saxenda for weight loss; notes that she went off for a few weeks when her father passed away suddenly in 05/05/22, but has been back on for ~5 weeks . Current pharmacotherapy: Saxenda 1.2 mg daily . Most recent weight: believes it was 187 lbs, has not seen much weight loss since restarting Saxenda . Current exercise: notes she has not been exercising on the treadmill lately, is exhausted when she finally gets done for the evening . 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 Current meal patterns:  o Breakfast: banana, sometimes school foods (donuts, muffins) o Lunch: sandwich,  o Snacks: notes that she will snack poorly when she gets home o Dinner: meat, vegetable, sometimes pastas, lasagnas, casseroles o Drinks: tries to drink water, but bathroom break time is limited; sometimes diet drinks; sweet tea   Pharmacist Clinical Goal(s):  Marland Kitchen Over the next 90 days, patient with work with PharmD and primary care provider to address weight loss  Interventions: . Comprehensive medication review performed, medication list updated in electronic medical record . Inter-disciplinary care team collaboration (see longitudinal plan of care) . Increase Saxenda to 1.8 mg daily.  Marland Kitchen Extensive dietary counseling. Encouraged to choose protein options for breakfast and lunch especially, so that she isn't hungry and snacking before dinner. Suggested quick meals like protein shakes that she can drink on the way to work.  . Encouraged to exercise, even if starting with small goals such as 10-15 minutes 2-3 days weekly. She verbalized  understanding.  Patient Self Care Activities:  . Patient will continue to focus on healthy dietary and exercise choices. . Patient will report any questions or concerns to provider   Please see past updates related to this goal by clicking on the "Past Updates" button in the selected goal         Patient verbalizes understanding of instructions provided today.   Plan:  - Patient has PCP f/u in ~ 4 weeks, scheduled f/u with me ~4 weeks later  Catie [name], PharmD, [place], [place] Pharmacist [place] [phone]

## 2019-08-21 ENCOUNTER — Encounter: Payer: Self-pay | Admitting: Obstetrics & Gynecology

## 2019-08-21 ENCOUNTER — Ambulatory Visit (INDEPENDENT_AMBULATORY_CARE_PROVIDER_SITE_OTHER): Payer: BC Managed Care – PPO | Admitting: Obstetrics & Gynecology

## 2019-08-21 ENCOUNTER — Other Ambulatory Visit: Payer: Self-pay

## 2019-08-21 VITALS — BP 120/80 | Ht 62.0 in | Wt 186.0 lb

## 2019-08-21 DIAGNOSIS — R102 Pelvic and perineal pain: Secondary | ICD-10-CM

## 2019-08-21 DIAGNOSIS — D219 Benign neoplasm of connective and other soft tissue, unspecified: Secondary | ICD-10-CM | POA: Diagnosis not present

## 2019-08-21 NOTE — H&P (View-Only) (Signed)
PRE-OPERATIVE HISTORY AND PHYSICAL EXAM  HPI:  Ariel Morgan is a 44 y.o. [id number] No LMP recorded. Patient has had an ablation.; she is being admitted for surgery related to fibroids and pelvic pain.  Pt seen today as she reports worsening pain that is sometimes right sided but also at times all over lower abdomen.  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  Bleeding is irregular.  PMHx: Past Medical History:  Diagnosis Date  . Anemia   . Anxiety   . Arthritis   . Frequent headaches    H/O  . GERD (gastroesophageal reflux disease)   . Hypercholesterolemia   . Placenta previa    Past Surgical History:  Procedure Laterality Date  . ABDOMINAL EXPOSURE N/A 01/01/2018   Procedure: ABDOMINAL EXPOSURE;  Surgeon: Marty Heck, MD;  Location: [place];  Service: Vascular;  Laterality: N/A;  . ANTERIOR LUMBAR FUSION N/A 01/01/2018   Procedure: ANTERIOR LUMBAR FUSION L5-S1; REPLACEMENT OF posterior HARDWARE L4-S1;  Surgeon: Melina Schools, MD;  Location: [place];  Service: Orthopedics;  Laterality: N/A;  . BREAST BIOPSY Right 05/10/2017   right breast stereotatic bx path pending  . CESAREAN SECTION  2003 & 2010  . CHOLECYSTECTOMY  2005  . LUMBAR LAMINECTOMY/DECOMPRESSION MICRODISCECTOMY N/A 12/28/2017   Procedure: L4-S1 decompression and fusion;  Surgeon: Melina Schools, MD;  Location: [place];  Service: Orthopedics;  Laterality: N/A;  4.5 hrs   Family History  Problem Relation Age of Onset  . Hyperlipidemia Father   . Diabetes Sister   . Diabetes Maternal Grandmother   . Cancer Maternal Grandmother        vaginal  . Cirrhosis Maternal Grandmother   . Colon polyps Maternal Grandmother   . Breast cancer Maternal Grandmother        Early 48's   Social History   Tobacco Use  . Smoking status:  Former Smoker    Packs/day: 1.00    Types: Cigarettes  . Smokeless tobacco: Never Used  Substance Use Topics  . Alcohol use: Yes    Alcohol/week: 0.0 standard drinks    Comment: rarely  . Drug use: No    Current Outpatient Medications:  .  acyclovir cream (ZOVIRAX) 5 %, Apply to affected area qid. (Patient not taking: Reported on 05/28/2019), Disp: 15 g, Rfl: 0 .  escitalopram (LEXAPRO) 20 MG tablet, TAKE 1 TABLET BY MOUTH EVERY DAY (Patient not taking: Reported on 08/21/2019), Disp: 90 tablet, Rfl: 0 .  Insulin Pen Needle (PEN NEEDLES) 32G X 4 MM MISC, Inject 1 pen into the skin daily. Use with Saxenda (Patient not taking: Reported on 08/21/2019), Disp: 100 each, Rfl: 1 .  Liraglutide -Weight Management (SAXENDA) 18 MG/3ML SOPN, Inject 0.3 mLs (1.8 mg total) into the skin daily. (Patient not taking: Reported on 08/21/2019), Disp: 3 pen, Rfl: 2 .  pregabalin (LYRICA) 50 MG capsule, TAKE 1 CAPSULE BY MOUTH EVERY DAY (Patient not taking: Reported on 08/21/2019), Disp: 30 capsule, Rfl: 1 Allergies: Patient has no known allergies.  Review of Systems  Constitutional: Positive for malaise/fatigue. Negative for chills and fever.  HENT: Negative for congestion, sinus pain and sore throat.   Eyes: Negative for blurred vision and pain.  Respiratory: Negative for cough and wheezing.   Cardiovascular: Negative for chest pain and leg swelling.  Gastrointestinal:  Positive for abdominal pain. Negative for constipation, diarrhea, heartburn, nausea and vomiting.  Genitourinary: Negative for dysuria, frequency, hematuria and urgency.  Musculoskeletal: Negative for back pain, joint pain, myalgias and neck pain.  Skin: Negative for itching and rash.  Neurological: Positive for headaches. Negative for dizziness, tremors and weakness.  Endo/Heme/Allergies: Does not bruise/bleed easily.  Psychiatric/Behavioral: Negative for depression. The patient is not nervous/anxious and does not have insomnia.      Objective: BP 120/80   Ht 5\' 2"  (1.575 m)   Wt 186 lb (84.4 kg)   BMI 34.02 kg/m   Filed Weights   08/21/19 1111  Weight: 186 lb (84.4 kg)   Physical Exam Constitutional:      General: She is not in acute distress.    Appearance: She is well-developed.  HENT:     Head: Normocephalic and atraumatic. No laceration.     Right Ear: Hearing normal.     Left Ear: Hearing normal.     Mouth/Throat:     Pharynx: Uvula midline.  Eyes:     Pupils: Pupils are equal, round, and reactive to light.  Neck:     Thyroid: No thyromegaly.  Cardiovascular:     Rate and Rhythm: Normal rate and regular rhythm.     Heart sounds: No murmur. No friction rub. No gallop.   Pulmonary:     Effort: Pulmonary effort is normal. No respiratory distress.     Breath sounds: Normal breath sounds. No wheezing.  Chest:     Breasts:        Right: No mass, skin change or tenderness.        Left: No mass, skin change or tenderness.  Abdominal:     General: Bowel sounds are normal. There is no distension.     Palpations: Abdomen is soft.     Tenderness: There is no abdominal tenderness. There is no rebound.  Musculoskeletal:        General: Normal range of motion.     Cervical back: Normal range of motion and neck supple.  Neurological:     Mental Status: She is alert and oriented to person, place, and time.     Cranial Nerves: No cranial nerve deficit.  Skin:    General: Skin is warm and dry.  Psychiatric:        Judgment: Judgment normal.  Vitals reviewed.    Assessment: 1. Fibroid   2. Pelvic pain   Options discussed.  Surgery planning today. Fibroid treatment such as [place], Lupron, Myomectomy, and Hysterectomy discussed in detail, with the pros and cons of each choice counseled.  No treatment as an option also discussed, as well as control of symptoms alone with hormone therapy. Information provided to the patient. Pt prefers TLH Preservation of ovaries discussed, planned. Recovery discussed. Hope  and likelihood for migraine headache effects of surgery discussed as well.  I have had a careful discussion with this patient about all the options available and the risk/benefits of each. I have fully informed this patient that surgery may subject her to a variety of discomforts and risks: She understands that most patients have surgery with little difficulty, but problems can happen ranging from minor to fatal. These include nausea, vomiting, pain, bleeding, infection, poor healing, hernia, or formation of adhesions. Unexpected reactions may occur from any drug or anesthetic given. Unintended injury may occur to other pelvic or abdominal structures such as Fallopian tubes, ovaries, bladder, ureter (tube from kidney to bladder), or bowel. Nerves going  from the pelvis to the legs may be injured. Any such injury may require immediate or later additional surgery to correct the problem. Excessive blood loss requiring transfusion is very unlikely but possible. Dangerous blood clots may form in the legs or lungs. Physical and sexual activity will be restricted in varying degrees for an indeterminate period of time but most often 2-6 weeks.  Finally, she understands that it is impossible to list every possible undesirable effect and that the condition for which surgery is done is not always cured or significantly improved, and in rare cases may be even worse.Ample time was given to answer all questions.  Barnett Applebaum, MD, [facility], [facility] 08/21/2019  11:42 AM

## 2019-08-21 NOTE — Progress Notes (Signed)
PRE-OPERATIVE HISTORY AND PHYSICAL EXAM  HPI:  Ariel Morgan is a 44 y.o. [id number] No LMP recorded. Patient has had an ablation.; she is being admitted for surgery related to fibroids and pelvic pain.  Pt seen today as she reports worsening pain that is sometimes right sided but also at times all over lower abdomen.  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  Bleeding is irregular.  PMHx: Past Medical History:  Diagnosis Date  . Anemia   . Anxiety   . Arthritis   . Frequent headaches    H/O  . GERD (gastroesophageal reflux disease)   . Hypercholesterolemia   . Placenta previa    Past Surgical History:  Procedure Laterality Date  . ABDOMINAL EXPOSURE N/A 01/01/2018   Procedure: ABDOMINAL EXPOSURE;  Surgeon: Marty Heck, MD;  Location: [place];  Service: Vascular;  Laterality: N/A;  . ANTERIOR LUMBAR FUSION N/A 01/01/2018   Procedure: ANTERIOR LUMBAR FUSION L5-S1; REPLACEMENT OF posterior HARDWARE L4-S1;  Surgeon: Melina Schools, MD;  Location: [place];  Service: Orthopedics;  Laterality: N/A;  . BREAST BIOPSY Right 05/10/2017   right breast stereotatic bx path pending  . CESAREAN SECTION  2003 & 2010  . CHOLECYSTECTOMY  2005  . LUMBAR LAMINECTOMY/DECOMPRESSION MICRODISCECTOMY N/A 12/28/2017   Procedure: L4-S1 decompression and fusion;  Surgeon: Melina Schools, MD;  Location: [place];  Service: Orthopedics;  Laterality: N/A;  4.5 hrs   Family History  Problem Relation Age of Onset  . Hyperlipidemia Father   . Diabetes Sister   . Diabetes Maternal Grandmother   . Cancer Maternal Grandmother        vaginal  . Cirrhosis Maternal Grandmother   . Colon polyps Maternal Grandmother   . Breast cancer Maternal Grandmother        Early 16's   Social History   Tobacco Use  . Smoking status:  Former Smoker    Packs/day: 1.00    Types: Cigarettes  . Smokeless tobacco: Never Used  Substance Use Topics  . Alcohol use: Yes    Alcohol/week: 0.0 standard drinks    Comment: rarely  . Drug use: No    Current Outpatient Medications:  .  acyclovir cream (ZOVIRAX) 5 %, Apply to affected area qid. (Patient not taking: Reported on 05/28/2019), Disp: 15 g, Rfl: 0 .  escitalopram (LEXAPRO) 20 MG tablet, TAKE 1 TABLET BY MOUTH EVERY DAY (Patient not taking: Reported on 08/21/2019), Disp: 90 tablet, Rfl: 0 .  Insulin Pen Needle (PEN NEEDLES) 32G X 4 MM MISC, Inject 1 pen into the skin daily. Use with Saxenda (Patient not taking: Reported on 08/21/2019), Disp: 100 each, Rfl: 1 .  Liraglutide -Weight Management (SAXENDA) 18 MG/3ML SOPN, Inject 0.3 mLs (1.8 mg total) into the skin daily. (Patient not taking: Reported on 08/21/2019), Disp: 3 pen, Rfl: 2 .  pregabalin (LYRICA) 50 MG capsule, TAKE 1 CAPSULE BY MOUTH EVERY DAY (Patient not taking: Reported on 08/21/2019), Disp: 30 capsule, Rfl: 1 Allergies: Patient has no known allergies.  Review of Systems  Constitutional: Positive for malaise/fatigue. Negative for chills and fever.  HENT: Negative for congestion, sinus pain and sore throat.   Eyes: Negative for blurred vision and pain.  Respiratory: Negative for cough and wheezing.   Cardiovascular: Negative for chest pain and leg swelling.  Gastrointestinal:  Positive for abdominal pain. Negative for constipation, diarrhea, heartburn, nausea and vomiting.  Genitourinary: Negative for dysuria, frequency, hematuria and urgency.  Musculoskeletal: Negative for back pain, joint pain, myalgias and neck pain.  Skin: Negative for itching and rash.  Neurological: Positive for headaches. Negative for dizziness, tremors and weakness.  Endo/Heme/Allergies: Does not bruise/bleed easily.  Psychiatric/Behavioral: Negative for depression. The patient is not nervous/anxious and does not have insomnia.      Objective: BP 120/80   Ht 5\' 2"  (1.575 m)   Wt 186 lb (84.4 kg)   BMI 34.02 kg/m   Filed Weights   08/21/19 1111  Weight: 186 lb (84.4 kg)   Physical Exam Constitutional:      General: She is not in acute distress.    Appearance: She is well-developed.  HENT:     Head: Normocephalic and atraumatic. No laceration.     Right Ear: Hearing normal.     Left Ear: Hearing normal.     Mouth/Throat:     Pharynx: Uvula midline.  Eyes:     Pupils: Pupils are equal, round, and reactive to light.  Neck:     Thyroid: No thyromegaly.  Cardiovascular:     Rate and Rhythm: Normal rate and regular rhythm.     Heart sounds: No murmur. No friction rub. No gallop.   Pulmonary:     Effort: Pulmonary effort is normal. No respiratory distress.     Breath sounds: Normal breath sounds. No wheezing.  Chest:     Breasts:        Right: No mass, skin change or tenderness.        Left: No mass, skin change or tenderness.  Abdominal:     General: Bowel sounds are normal. There is no distension.     Palpations: Abdomen is soft.     Tenderness: There is no abdominal tenderness. There is no rebound.  Musculoskeletal:        General: Normal range of motion.     Cervical back: Normal range of motion and neck supple.  Neurological:     Mental Status: She is alert and oriented to person, place, and time.     Cranial Nerves: No cranial nerve deficit.  Skin:    General: Skin is warm and dry.  Psychiatric:        Judgment: Judgment normal.  Vitals reviewed.    Assessment: 1. Fibroid   2. Pelvic pain   Options discussed.  Surgery planning today. Fibroid treatment such as [place], Lupron, Myomectomy, and Hysterectomy discussed in detail, with the pros and cons of each choice counseled.  No treatment as an option also discussed, as well as control of symptoms alone with hormone therapy. Information provided to the patient. Pt prefers TLH Preservation of ovaries discussed, planned. Recovery  discussed. Hope and likelihood for migraine headache effects of surgery discussed as well.  I have had a careful discussion with this patient about all the options available and the risk/benefits of each. I have fully informed this patient that surgery may subject her to a variety of discomforts and risks: She understands that most patients have surgery with little difficulty, but problems can happen ranging from minor to fatal. These include nausea, vomiting, pain, bleeding, infection, poor healing, hernia, or formation of adhesions. Unexpected reactions may occur from any drug or anesthetic given. Unintended injury may occur to other pelvic or abdominal structures such as Fallopian tubes, ovaries, bladder, ureter (tube from kidney to bladder), or bowel. Nerves going  from the pelvis to the legs may be injured. Any such injury may require immediate or later additional surgery to correct the problem. Excessive blood loss requiring transfusion is very unlikely but possible. Dangerous blood clots may form in the legs or lungs. Physical and sexual activity will be restricted in varying degrees for an indeterminate period of time but most often 2-6 weeks.  Finally, she understands that it is impossible to list every possible undesirable effect and that the condition for which surgery is done is not always cured or significantly improved, and in rare cases may be even worse.Ample time was given to answer all questions.  Barnett Applebaum, MD, [facility], [facility] 08/21/2019  11:42 AM

## 2019-08-21 NOTE — Patient Instructions (Signed)
Total Laparoscopic Hysterectomy A total laparoscopic hysterectomy is a minimally invasive surgery to remove the uterus and cervix. The fallopian tubes and ovaries can also be removed (bilateral salpingo-oophorectomy) during this surgery, if necessary. This procedure may be done to treat problems such as:  Noncancerous growths in the uterus (uterine fibroids) that cause symptoms.  A condition that causes the lining of the uterus (endometrium) to grow in other areas (endometriosis).  Problems with pelvic support. This is caused by weakened muscles of the pelvis following vaginal childbirth or menopause.  Cancer of the cervix, ovaries, uterus, or endometrium.  Excessive (dysfunctional) uterine bleeding. This surgery is performed by inserting a thin, lighted tube (laparoscope) and surgical instruments into small incisions in the abdomen. The laparoscope sends images to a monitor. The images help the health care provider perform the procedure. After this procedure, you will no longer be able to have a baby, and you will no longer have a menstrual period. Tell a health care provider about:  Any allergies you have.  All medicines you are taking, including vitamins, herbs, eye drops, creams, and over-the-counter medicines.  Any problems you or family members have had with anesthetic medicines.  Any blood disorders you have.  Any surgeries you have had.  Any medical conditions you have.  Whether you are pregnant or may be pregnant. What are the risks? Generally, this is a safe procedure. However, problems may occur, including:  Infection.  Bleeding.  Blood clots in the legs or lungs.  Allergic reactions to medicines.  Damage to other structures or organs.  The risk that the surgery may have to be switched to the regular one in which a large incision is made in the abdomen (abdominal hysterectomy). What happens before the procedure? Staying hydrated Follow instructions from your  health care provider about hydration, which may include:  Up to 2 hours before the procedure - you may continue to drink clear liquids, such as water, clear fruit juice, black coffee, and plain tea Eating and drinking restrictions Follow instructions from your health care provider about eating and drinking, which may include:  8 hours before the procedure - stop eating heavy meals or foods such as meat, fried foods, or fatty foods.  6 hours before the procedure - stop eating light meals or foods, such as toast or cereal.  6 hours before the procedure - stop drinking milk or drinks that contain milk.  2 hours before the procedure - stop drinking clear liquids. Medicines  Ask your health care provider about: ? Changing or stopping your regular medicines. This is especially important if you are taking diabetes medicines or blood thinners. ? Taking over-the-counter medicines, vitamins, herbs, and supplements. ? Taking medicines such as aspirin and ibuprofen. These medicines can thin your blood. Do not take these medicines unless your health care provider tells you to take them.  You may be given antibiotic medicine to help prevent infection.  You may be asked to take laxatives.  You may be given medicines to help prevent nausea and vomiting after the procedure. General instructions  Ask your health care provider how your surgical site will be marked or identified.  You may be asked to shower with a germ-killing soap.  Do not use any products that contain nicotine or tobacco, such as cigarettes and e-cigarettes. If you need help quitting, ask your health care provider.  You may have an exam or testing, such as an ultrasound to determine the size and shape of your pelvic organs.    You may have a blood or urine sample taken.  This procedure can affect the way you feel about yourself. Talk with your health care provider about the physical and emotional changes hysterectomy may  cause.  Plan to have someone take you home from the hospital or clinic.  Plan to have a responsible adult care for you for at least 24 hours after you leave the hospital or clinic. This is important. What happens during the procedure?  To lower your risk of infection: ? Your health care team will wash or sanitize their hands. ? Your skin will be washed with soap. ? Hair may be removed from the surgical area.  An IV will be inserted into one of your veins.  You will be given one or more of the following: ? A medicine to help you relax (sedative). ? A medicine to make you fall asleep (general anesthetic).  You will be given antibiotic medicine through your IV.  A tube may be inserted down your throat to help you breathe during the procedure.  A gas (carbon dioxide) will be used to inflate your abdomen to allow your surgeon to see inside of your abdomen.  Three or four small incisions will be made in your abdomen.  A laparoscope will be inserted into one of your incisions. Surgical instruments will be inserted through the other incisions in order to perform the procedure.  Your uterus and cervix may be removed through your vagina or cut into small pieces and removed through the small incisions. Any other organs that need to be removed will also be removed this way.  Carbon dioxide will be released from inside of your abdomen.  Your incisions will be closed with stitches (sutures).  A bandage (dressing) may be placed over your incisions. The procedure may vary among health care providers and hospitals. What happens after the procedure?  Your blood pressure, heart rate, breathing rate, and blood oxygen level will be monitored until the medicines you were given have worn off.  You will be given medicine for pain and nausea as needed.  Do not drive for 24 hours if you received a sedative. Summary  Total Laparoscopic hysterectomy is a procedure to remove your uterus, cervix and  sometimes the fallopian tubes and ovaries.  This procedure can affect the way you feel about yourself. Talk with your health care provider about the physical and emotional changes hysterectomy may cause.  After this procedure, you will no longer be able to have a baby, and you will no longer have a menstrual period.  You will be given pain medicine to control discomfort after this procedure. This information is not intended to replace advice given to you by your health care provider. Make sure you discuss any questions you have with your health care provider. Document Revised: 03/04/2017 Document Reviewed: 06/02/2016 Elsevier Patient Education  2020 [employer]  

## 2019-08-28 ENCOUNTER — Telehealth: Payer: Self-pay | Admitting: Obstetrics & Gynecology

## 2019-08-28 NOTE — Telephone Encounter (Signed)
Contacted pt to go over surgery info  DOS 6/8 TLH/BS with [facility]  Covid testing 6/4, [address] drive up and wear mask. Adv pt to quar until DOS  Adv she will have pre-admit telephone appt and infor for it will be updated to her MyChart  Also that she may rec calls from [place] and pre service center   Conf pt has [employer]

## 2019-08-28 NOTE — Telephone Encounter (Signed)
Looks like she is scheduled.   Can you discuss w her?

## 2019-08-28 NOTE — Telephone Encounter (Signed)
Yes, I apologize for not reaching out to her yet. Contacting her now.

## 2019-09-05 ENCOUNTER — Other Ambulatory Visit: Payer: Self-pay

## 2019-09-05 ENCOUNTER — Encounter
Admission: RE | Admit: 2019-09-05 | Discharge: 2019-09-05 | Disposition: A | Discharge status: Home / Self Care | Payer: BC Managed Care – PPO | Source: Ambulatory Visit | Attending: Obstetrics & Gynecology | Admitting: Obstetrics & Gynecology

## 2019-09-05 NOTE — Patient Instructions (Signed)
Your procedure is scheduled on: Tuesday September 11, 2019 Report to Day Surgery. To find out your arrival time please call [phone] between 1PM - 3PM on Monday September 10, 2019   Remember: Instructions that are not followed completely may result in serious medical risk,  up to and including death, or upon the discretion of your surgeon and anesthesiologist your  surgery may need to be rescheduled.     _X__ 1. Do not eat food after midnight the night before your procedure.                 No gum chewing or hard candies. You may drink clear liquids up to 2 hours                 before you are scheduled to arrive for your surgery- DO not drink clear                 liquids within 2 hours of the start of your surgery.                 Clear Liquids include:  water, apple juice without pulp, clear Gatorade, G2 or                  Gatorade Zero (avoid Red/Purple/Blue), Black Coffee or Tea (Do not add                 anything to coffee or tea).  __X__2.  On the morning of surgery brush your teeth with toothpaste and water, you                may rinse your mouth with mouthwash if you wish.  Do not swallow any toothpaste of mouthwash.     _X__ 3.  No Alcohol for 24 hours before or after surgery.   _X__ 4.  Do Not Smoke or use e-cigarettes For 24 Hours Prior to Your Surgery.                 Do not use any chewable tobacco products for at least 6 hours prior to                 Surgery.  _X__  5.  Do not use any recreational drugs (marijuana, cocaine, heroin, ecstacy, MDMA or other)                For at least one week prior to your surgery.  Combination of these drugs with anesthesia                May have life threatening results.  __x__ 6.  Notify your doctor if there is any change in your medical condition      (cold, fever, infections).     Do not wear jewelry, make-up, hairpins, clips or nail polish. Do not wear lotions, powders, or perfumes. You may wear  deodorant. Do not shave 48 hours prior to surgery. Men may shave face and neck. Do not bring valuables to the hospital.    [facility] is not responsible for any belongings or valuables.  Contacts, dentures or bridgework may not be worn into surgery. Leave your suitcase in the car. After surgery it may be brought to your room. For patients admitted to the hospital, discharge time is determined by your treatment team.   Patients discharged the day of surgery will not be allowed to drive home.   Make arrangements for someone to be with you for the  first 24 hours of your Same Day Discharge.  __X__ Take these medicines the morning of surgery with A SIP OF WATER:    1. escitalopram (LEXAPRO) 20 MG     __X__ Use CHG Soap as directed  __X__ Stop Anti-inflammatories such Ibuprofen, Aleve, naproxen, aspirin and or BC powders.    __X__ Stop supplements until after surgery.    __X__  Do not start any herbal supplements before your surgery.

## 2019-09-07 ENCOUNTER — Other Ambulatory Visit: Payer: Self-pay

## 2019-09-07 ENCOUNTER — Other Ambulatory Visit
Admission: RE | Admit: 2019-09-07 | Discharge: 2019-09-07 | Disposition: A | Discharge status: Home / Self Care | Payer: BC Managed Care – PPO | Source: Ambulatory Visit | Attending: Obstetrics & Gynecology | Admitting: Obstetrics & Gynecology

## 2019-09-07 ENCOUNTER — Ambulatory Visit: Payer: BC Managed Care – PPO | Admitting: Internal Medicine

## 2019-09-07 DIAGNOSIS — Z20822 Contact with and (suspected) exposure to covid-19: Secondary | ICD-10-CM | POA: Diagnosis not present

## 2019-09-07 DIAGNOSIS — Z01812 Encounter for preprocedural laboratory examination: Secondary | ICD-10-CM | POA: Diagnosis not present

## 2019-09-07 LAB — CBC
HCT: 38.5 % (ref 36.0–46.0)
Hemoglobin: 13.1 g/dL (ref 12.0–15.0)
MCH: 29.8 pg (ref 26.0–34.0)
MCHC: 34 g/dL (ref 30.0–36.0)
MCV: 87.7 fL (ref 80.0–100.0)
Platelets: 345 10*3/uL (ref 150–400)
RBC: 4.39 MIL/uL (ref 3.87–5.11)
RDW: 13.6 % (ref 11.5–15.5)
WBC: 7.5 10*3/uL (ref 4.0–10.5)
nRBC: 0 % (ref 0.0–0.2)

## 2019-09-07 LAB — TYPE AND SCREEN
ABO/RH(D): O POS
Antibody Screen: NEGATIVE

## 2019-09-07 LAB — SARS CORONAVIRUS 2 (TAT 6-24 HRS): SARS Coronavirus 2: NEGATIVE

## 2019-09-10 MED ORDER — SODIUM CHLORIDE 0.9 % IV SOLN
2.0000 g | INTRAVENOUS | Status: AC
  Administered 2019-09-11: 2 g via INTRAVENOUS

## 2019-09-11 ENCOUNTER — Encounter: Payer: Self-pay | Admitting: Obstetrics & Gynecology

## 2019-09-11 ENCOUNTER — Ambulatory Visit: Payer: BC Managed Care – PPO | Admitting: Certified Registered Nurse Anesthetist

## 2019-09-11 ENCOUNTER — Other Ambulatory Visit: Payer: Self-pay

## 2019-09-11 ENCOUNTER — Encounter
Admission: RE | Disposition: A | Discharge status: Home / Self Care | Payer: Self-pay | Source: Home / Self Care | Attending: Obstetrics & Gynecology

## 2019-09-11 ENCOUNTER — Ambulatory Visit
Admission: RE | Admit: 2019-09-11 | Discharge: 2019-09-11 | Disposition: A | Discharge status: Home / Self Care | Payer: BC Managed Care – PPO | Attending: Obstetrics & Gynecology | Admitting: Obstetrics & Gynecology

## 2019-09-11 DIAGNOSIS — R102 Pelvic and perineal pain: Secondary | ICD-10-CM | POA: Diagnosis not present

## 2019-09-11 DIAGNOSIS — E7800 Pure hypercholesterolemia, unspecified: Secondary | ICD-10-CM | POA: Diagnosis not present

## 2019-09-11 DIAGNOSIS — G43909 Migraine, unspecified, not intractable, without status migrainosus: Secondary | ICD-10-CM | POA: Diagnosis not present

## 2019-09-11 DIAGNOSIS — E119 Type 2 diabetes mellitus without complications: Secondary | ICD-10-CM | POA: Insufficient documentation

## 2019-09-11 DIAGNOSIS — D259 Leiomyoma of uterus, unspecified: Secondary | ICD-10-CM | POA: Diagnosis not present

## 2019-09-11 DIAGNOSIS — Z79899 Other long term (current) drug therapy: Secondary | ICD-10-CM | POA: Diagnosis not present

## 2019-09-11 DIAGNOSIS — N838 Other noninflammatory disorders of ovary, fallopian tube and broad ligament: Secondary | ICD-10-CM | POA: Diagnosis not present

## 2019-09-11 DIAGNOSIS — Z981 Arthrodesis status: Secondary | ICD-10-CM | POA: Insufficient documentation

## 2019-09-11 DIAGNOSIS — Z794 Long term (current) use of insulin: Secondary | ICD-10-CM | POA: Diagnosis not present

## 2019-09-11 DIAGNOSIS — D219 Benign neoplasm of connective and other soft tissue, unspecified: Secondary | ICD-10-CM | POA: Diagnosis not present

## 2019-09-11 DIAGNOSIS — F419 Anxiety disorder, unspecified: Secondary | ICD-10-CM | POA: Diagnosis not present

## 2019-09-11 DIAGNOSIS — R1031 Right lower quadrant pain: Secondary | ICD-10-CM | POA: Diagnosis not present

## 2019-09-11 DIAGNOSIS — M1990 Unspecified osteoarthritis, unspecified site: Secondary | ICD-10-CM | POA: Diagnosis not present

## 2019-09-11 DIAGNOSIS — K219 Gastro-esophageal reflux disease without esophagitis: Secondary | ICD-10-CM | POA: Diagnosis not present

## 2019-09-11 DIAGNOSIS — Z87891 Personal history of nicotine dependence: Secondary | ICD-10-CM | POA: Insufficient documentation

## 2019-09-11 HISTORY — PX: CYSTOSCOPY: SHX5120

## 2019-09-11 HISTORY — PX: TOTAL LAPAROSCOPIC HYSTERECTOMY WITH SALPINGECTOMY: SHX6742

## 2019-09-11 LAB — POCT PREGNANCY, URINE: Preg Test, Ur: NEGATIVE

## 2019-09-11 LAB — ABO/RH: ABO/RH(D): O POS

## 2019-09-11 SURGERY — HYSTERECTOMY, TOTAL, LAPAROSCOPIC, WITH SALPINGECTOMY
Anesthesia: General | Laterality: Bilateral

## 2019-09-11 MED ORDER — SUGAMMADEX SODIUM 200 MG/2ML IV SOLN
INTRAVENOUS | Status: DC | PRN
  Administered 2019-09-11: 200 mg via INTRAVENOUS

## 2019-09-11 MED ORDER — KETOROLAC TROMETHAMINE 30 MG/ML IJ SOLN
INTRAMUSCULAR | Status: AC
  Filled 2019-09-11: qty 1

## 2019-09-11 MED ORDER — ROCURONIUM BROMIDE 10 MG/ML (PF) SYRINGE
PREFILLED_SYRINGE | INTRAVENOUS | Status: AC
  Filled 2019-09-11: qty 20

## 2019-09-11 MED ORDER — LACTATED RINGERS IV SOLN: INTRAVENOUS | Status: DC

## 2019-09-11 MED ORDER — FAMOTIDINE 20 MG PO TABS
ORAL_TABLET | ORAL | Status: AC
  Administered 2019-09-11: 20 mg via ORAL
  Filled 2019-09-11: qty 1

## 2019-09-11 MED ORDER — FENTANYL CITRATE (PF) 100 MCG/2ML IJ SOLN
INTRAMUSCULAR | Status: AC
  Filled 2019-09-11: qty 2

## 2019-09-11 MED ORDER — ONDANSETRON HCL 4 MG/2ML IJ SOLN
INTRAMUSCULAR | Status: AC
  Filled 2019-09-11: qty 6

## 2019-09-11 MED ORDER — OXYCODONE HCL 5 MG PO TABS
ORAL_TABLET | ORAL | Status: AC
  Filled 2019-09-11: qty 1

## 2019-09-11 MED ORDER — MIDAZOLAM HCL 2 MG/2ML IJ SOLN
INTRAMUSCULAR | Status: AC
  Filled 2019-09-11: qty 2

## 2019-09-11 MED ORDER — OXYCODONE-ACETAMINOPHEN 5-325 MG PO TABS: 1.0000 | ORAL_TABLET | ORAL | 0 refills | Status: DC | PRN

## 2019-09-11 MED ORDER — ACETAMINOPHEN 325 MG PO TABS: 650.0000 mg | ORAL_TABLET | ORAL | Status: DC | PRN

## 2019-09-11 MED ORDER — LACTATED RINGERS IV SOLN: Freq: Once | INTRAVENOUS | Status: DC

## 2019-09-11 MED ORDER — LIDOCAINE HCL (PF) 2 % IJ SOLN
INTRAMUSCULAR | Status: AC
  Filled 2019-09-11: qty 15

## 2019-09-11 MED ORDER — DEXAMETHASONE SODIUM PHOSPHATE 10 MG/ML IJ SOLN
INTRAMUSCULAR | Status: DC | PRN
  Administered 2019-09-11: 10 mg via INTRAVENOUS

## 2019-09-11 MED ORDER — OXYCODONE HCL 5 MG/5ML PO SOLN: 5.0000 mg | Freq: Once | ORAL | Status: AC | PRN

## 2019-09-11 MED ORDER — ONDANSETRON HCL 4 MG/2ML IJ SOLN
INTRAMUSCULAR | Status: DC | PRN
  Administered 2019-09-11: 4 mg via INTRAVENOUS

## 2019-09-11 MED ORDER — MORPHINE SULFATE (PF) 2 MG/ML IV SOLN: 1.0000 mg | INTRAVENOUS | Status: DC | PRN

## 2019-09-11 MED ORDER — SUGAMMADEX SODIUM 500 MG/5ML IV SOLN
INTRAVENOUS | Status: AC
  Filled 2019-09-11: qty 5

## 2019-09-11 MED ORDER — PHENYLEPHRINE HCL (PRESSORS) 10 MG/ML IV SOLN
INTRAVENOUS | Status: DC | PRN
  Administered 2019-09-11: 50 ug via INTRAVENOUS
  Administered 2019-09-11: 100 ug via INTRAVENOUS
  Administered 2019-09-11 (×4): 50 ug via INTRAVENOUS

## 2019-09-11 MED ORDER — FENTANYL CITRATE (PF) 100 MCG/2ML IJ SOLN
INTRAMUSCULAR | Status: AC
  Administered 2019-09-11: 25 ug via INTRAVENOUS
  Filled 2019-09-11: qty 2

## 2019-09-11 MED ORDER — OXYCODONE-ACETAMINOPHEN 5-325 MG PO TABS: 1.0000 | ORAL_TABLET | ORAL | Status: DC | PRN

## 2019-09-11 MED ORDER — MEPERIDINE HCL 50 MG/ML IJ SOLN: 6.2500 mg | INTRAMUSCULAR | Status: DC | PRN

## 2019-09-11 MED ORDER — ACETAMINOPHEN 10 MG/ML IV SOLN
INTRAVENOUS | Status: DC | PRN
  Administered 2019-09-11: 1000 mg via INTRAVENOUS

## 2019-09-11 MED ORDER — PROPOFOL 10 MG/ML IV BOLUS
INTRAVENOUS | Status: DC | PRN
  Administered 2019-09-11: 30 mg via INTRAVENOUS
  Administered 2019-09-11: 150 mg via INTRAVENOUS

## 2019-09-11 MED ORDER — OXYCODONE HCL 5 MG PO TABS
5.0000 mg | ORAL_TABLET | Freq: Once | ORAL | Status: AC | PRN
  Administered 2019-09-11: 5 mg via ORAL

## 2019-09-11 MED ORDER — ROCURONIUM BROMIDE 100 MG/10ML IV SOLN
INTRAVENOUS | Status: DC | PRN
  Administered 2019-09-11: 10 mg via INTRAVENOUS
  Administered 2019-09-11: 50 mg via INTRAVENOUS
  Administered 2019-09-11: 10 mg via INTRAVENOUS

## 2019-09-11 MED ORDER — BUPIVACAINE HCL (PF) 0.5 % IJ SOLN
INTRAMUSCULAR | Status: AC
  Filled 2019-09-11: qty 30

## 2019-09-11 MED ORDER — FAMOTIDINE 20 MG PO TABS: 20.0000 mg | ORAL_TABLET | Freq: Once | ORAL | Status: AC

## 2019-09-11 MED ORDER — BUPIVACAINE HCL (PF) 0.5 % IJ SOLN
INTRAMUSCULAR | Status: DC | PRN
  Administered 2019-09-11: 21 mL

## 2019-09-11 MED ORDER — FENTANYL CITRATE (PF) 100 MCG/2ML IJ SOLN
INTRAMUSCULAR | Status: DC | PRN
  Administered 2019-09-11 (×2): 50 ug via INTRAVENOUS
  Administered 2019-09-11 (×4): 25 ug via INTRAVENOUS

## 2019-09-11 MED ORDER — DEXAMETHASONE SODIUM PHOSPHATE 10 MG/ML IJ SOLN
INTRAMUSCULAR | Status: AC
  Filled 2019-09-11: qty 3

## 2019-09-11 MED ORDER — ACETAMINOPHEN 10 MG/ML IV SOLN
INTRAVENOUS | Status: AC
  Filled 2019-09-11: qty 100

## 2019-09-11 MED ORDER — OXYCODONE HCL 5 MG PO TABS
ORAL_TABLET | ORAL | Status: AC
  Administered 2019-09-11: 5 mg via ORAL
  Filled 2019-09-11: qty 1

## 2019-09-11 MED ORDER — SODIUM CHLORIDE 0.9 % IV SOLN
INTRAVENOUS | Status: AC
  Filled 2019-09-11: qty 2

## 2019-09-11 MED ORDER — KETOROLAC TROMETHAMINE 30 MG/ML IJ SOLN
INTRAMUSCULAR | Status: DC | PRN
  Administered 2019-09-11: 30 mg via INTRAVENOUS

## 2019-09-11 MED ORDER — ACETAMINOPHEN 650 MG RE SUPP
650.0000 mg | RECTAL | Status: DC | PRN
  Filled 2019-09-11: qty 1

## 2019-09-11 MED ORDER — MIDAZOLAM HCL 2 MG/2ML IJ SOLN
INTRAMUSCULAR | Status: DC | PRN
  Administered 2019-09-11: 2 mg via INTRAVENOUS

## 2019-09-11 MED ORDER — CHLORHEXIDINE GLUCONATE 0.12 % MT SOLN
OROMUCOSAL | Status: AC
  Administered 2019-09-11: 15 mL via OROMUCOSAL
  Filled 2019-09-11: qty 15

## 2019-09-11 MED ORDER — PROMETHAZINE HCL 25 MG/ML IJ SOLN: 6.2500 mg | INTRAMUSCULAR | Status: DC | PRN

## 2019-09-11 MED ORDER — LIDOCAINE HCL (CARDIAC) PF 100 MG/5ML IV SOSY
PREFILLED_SYRINGE | INTRAVENOUS | Status: DC | PRN
  Administered 2019-09-11: 100 mg via INTRAVENOUS

## 2019-09-11 MED ORDER — FENTANYL CITRATE (PF) 100 MCG/2ML IJ SOLN
INTRAMUSCULAR | Status: AC
  Administered 2019-09-11: 50 ug via INTRAVENOUS
  Filled 2019-09-11: qty 2

## 2019-09-11 MED ORDER — DEXMEDETOMIDINE HCL 200 MCG/2ML IV SOLN
INTRAVENOUS | Status: DC | PRN
  Administered 2019-09-11 (×4): 4 ug via INTRAVENOUS

## 2019-09-11 MED ORDER — OXYCODONE HCL 5 MG PO TABS: 5.0000 mg | ORAL_TABLET | Freq: Once | ORAL | Status: AC

## 2019-09-11 MED ORDER — ORAL CARE MOUTH RINSE: 15.0000 mL | Freq: Once | OROMUCOSAL | Status: AC

## 2019-09-11 MED ORDER — CHLORHEXIDINE GLUCONATE 0.12 % MT SOLN: 15.0000 mL | Freq: Once | OROMUCOSAL | Status: AC

## 2019-09-11 MED ORDER — FENTANYL CITRATE (PF) 100 MCG/2ML IJ SOLN
25.0000 ug | INTRAMUSCULAR | Status: DC | PRN
  Administered 2019-09-11: 50 ug via INTRAVENOUS
  Administered 2019-09-11: 25 ug via INTRAVENOUS

## 2019-09-11 SURGICAL SUPPLY — 55 items
ADH SKN CLS APL DERMABOND .7 (GAUZE/BANDAGES/DRESSINGS) ×2
APL PRP STRL LF DISP 70% ISPRP (MISCELLANEOUS) ×2
BAG DRN RND TRDRP ANRFLXCHMBR (UROLOGICAL SUPPLIES) ×2
BAG URINE DRAIN 2000ML AR STRL (UROLOGICAL SUPPLIES) ×3 IMPLANT
BLADE SURG SZ11 CARB STEEL (BLADE) ×3 IMPLANT
CANISTER SUCT 1200ML W/VALVE (MISCELLANEOUS) ×3 IMPLANT
CATH FOLEY 2WAY  5CC 16FR (CATHETERS) ×3
CATH FOLEY 2WAY 5CC 16FR (CATHETERS) ×2
CATH URTH 16FR FL 2W BLN LF (CATHETERS) ×2 IMPLANT
CHLORAPREP W/TINT 26 (MISCELLANEOUS) ×3 IMPLANT
COVER WAND RF STERILE (DRAPES) ×3 IMPLANT
DEFOGGER SCOPE WARMER CLEARIFY (MISCELLANEOUS) ×3 IMPLANT
DERMABOND ADVANCED (GAUZE/BANDAGES/DRESSINGS) ×1
DERMABOND ADVANCED .7 DNX12 (GAUZE/BANDAGES/DRESSINGS) ×2 IMPLANT
DEVICE SUTURE ENDOST 10MM (ENDOMECHANICALS) ×1 IMPLANT
DRAPE CAMERA CLOSED 9X96 (DRAPES) ×3 IMPLANT
DRSG TEGADERM 2-3/8X2-3/4 SM (GAUZE/BANDAGES/DRESSINGS) ×6 IMPLANT
GAUZE 4X4 16PLY RFD (DISPOSABLE) ×3 IMPLANT
GLOVE BIO SURGEON STRL SZ8 (GLOVE) ×18 IMPLANT
GLOVE INDICATOR 8.0 STRL GRN (GLOVE) ×6 IMPLANT
GOWN STRL REUS W/ TWL LRG LVL3 (GOWN DISPOSABLE) ×8 IMPLANT
GOWN STRL REUS W/ TWL XL LVL3 (GOWN DISPOSABLE) ×4 IMPLANT
GOWN STRL REUS W/TWL LRG LVL3 (GOWN DISPOSABLE) ×12
GOWN STRL REUS W/TWL XL LVL3 (GOWN DISPOSABLE) ×6
GRASPER SUT TROCAR 14GX15 (MISCELLANEOUS) ×3 IMPLANT
IRRIGATION STRYKERFLOW (MISCELLANEOUS) ×2 IMPLANT
IRRIGATOR STRYKERFLOW (MISCELLANEOUS) ×3
IV LACTATED RINGERS 1000ML (IV SOLUTION) ×6 IMPLANT
KIT PINK PAD W/HEAD ARE REST (MISCELLANEOUS) ×3
KIT PINK PAD W/HEAD ARM REST (MISCELLANEOUS) ×2 IMPLANT
KIT TURNOVER CYSTO (KITS) ×3 IMPLANT
LABEL OR SOLS (LABEL) ×3 IMPLANT
MANIPULATOR VCARE LG CRV RETR (MISCELLANEOUS) ×1 IMPLANT
MANIPULATOR VCARE SML CRV RETR (MISCELLANEOUS) IMPLANT
MANIPULATOR VCARE STD CRV RETR (MISCELLANEOUS) IMPLANT
NEEDLE VERESS 14GA 120MM (NEEDLE) ×3 IMPLANT
NS IRRIG 500ML POUR BTL (IV SOLUTION) ×3 IMPLANT
OCCLUDER COLPOPNEUMO (BALLOONS) ×3 IMPLANT
PACK GYN LAPAROSCOPIC (MISCELLANEOUS) ×3 IMPLANT
PAD OB MATERNITY 4.3X12.25 (PERSONAL CARE ITEMS) ×3 IMPLANT
PAD PREP 24X41 OB/GYN DISP (PERSONAL CARE ITEMS) ×3 IMPLANT
PORT ACCESS TROCAR AIRSEAL 12 (TROCAR) ×2 IMPLANT
PORT ACCESS TROCAR AIRSEAL 5M (TROCAR) ×1
SCISSORS METZENBAUM CVD 33 (INSTRUMENTS) ×1 IMPLANT
SET CYSTO W/LG BORE CLAMP LF (SET/KITS/TRAYS/PACK) ×3 IMPLANT
SET TRI-LUMEN FLTR TB AIRSEAL (TUBING) ×3 IMPLANT
SHEARS HARMONIC ACE PLUS 36CM (ENDOMECHANICALS) ×3 IMPLANT
SLEEVE ENDOPATH XCEL 5M (ENDOMECHANICALS) ×3 IMPLANT
SPONGE GAUZE 2X2 8PLY STRL LF (GAUZE/BANDAGES/DRESSINGS) IMPLANT
SUT ENDO VLOC 180-0-8IN (SUTURE) ×2 IMPLANT
SUT VIC AB 0 CT1 36 (SUTURE) ×3 IMPLANT
SUT VIC AB 4-0 FS2 27 (SUTURE) ×3 IMPLANT
SYR 10ML LL (SYRINGE) ×3 IMPLANT
SYR 50ML LL SCALE MARK (SYRINGE) ×3 IMPLANT
TROCAR XCEL NON-BLD 5MMX100MML (ENDOMECHANICALS) ×3 IMPLANT

## 2019-09-11 NOTE — Anesthesia Procedure Notes (Signed)
Procedure Name: Intubation Date/Time: 09/11/2019 9:50 AM Performed by: Lily Peer, Summer, RN Pre-anesthesia Checklist: Patient identified, Emergency Drugs available, Suction available and Patient being monitored Patient Re-evaluated:Patient Re-evaluated prior to induction Oxygen Delivery Method: Circle system utilized Preoxygenation: Pre-oxygenation with 100% oxygen Induction Type: IV induction Ventilation: Mask ventilation without difficulty Laryngoscope Size: McGraph and 3 Grade View: Grade I Tube type: Oral Tube size: 7.0 mm Number of attempts: 1 Airway Equipment and Method: Stylet and Oral airway Placement Confirmation: ETT inserted through vocal cords under direct vision,  positive ETCO2 and breath sounds checked- equal and bilateral Secured at: 21 cm Tube secured with: Tape Dental Injury: Teeth and Oropharynx as per pre-operative assessment

## 2019-09-11 NOTE — Discharge Instructions (Signed)
AMBULATORY SURGERY  DISCHARGE INSTRUCTIONS   1) The drugs that you were given will stay in your system until tomorrow so for the next 24 hours you should not:  A) Drive an automobile B) Make any legal decisions C) Drink any alcoholic beverage   2) You may resume regular meals tomorrow.  Today it is better to start with liquids and gradually work up to solid foods.  You may eat anything you prefer, but it is better to start with liquids, then soup and crackers, and gradually work up to solid foods.   3) Please notify your doctor immediately if you have any unusual bleeding, trouble breathing, redness and pain at the surgery site, drainage, fever, or pain not relieved by medication. 4)   5) Your post-operative visit with Dr.                                     is: Date:                        Time:    Please call to schedule your post-operative visit.  6) Additional Instructions:    Total Laparoscopic Hysterectomy, Care After This sheet gives you information about how to care for yourself after your procedure. Your health care provider may also give you more specific instructions. If you have problems or questions, contact your health care provider. What can I expect after the procedure? After the procedure, it is common to have:  Pain and bruising around your incisions.  A sore throat, if a breathing tube was used during surgery.  Fatigue.  Poor appetite.  Less interest in sex. If your ovaries were also removed, it is also common to have symptoms of menopause such as hot flashes, night sweats, and lack of sleep (insomnia). Follow these instructions at home: Bathing  Do not take baths, swim, or use a hot tub until your health care provider approves. You may need to only take showers for 2-3 weeks.  Keep your bandage (dressing) dry until your health care provider says it can be removed. Incision care   Follow instructions from your health care provider about how to  take care of your incisions. Make sure you: ? Wash your hands with soap and water before you change your dressing. If soap and water are not available, use hand sanitizer. ? Change your dressing as told by your health care provider. ? Leave stitches (sutures), skin glue, or adhesive strips in place. These skin closures may need to stay in place for 2 weeks or longer. If adhesive strip edges start to loosen and curl up, you may trim the loose edges. Do not remove adhesive strips completely unless your health care provider tells you to do that.  Check your incision area every day for signs of infection. Check for: ? Redness, swelling, or pain. ? Fluid or blood. ? Warmth. ? Pus or a bad smell. Activity  Get plenty of rest and sleep.  Do not lift anything that is heavier than 10 lbs (4.5 kg) for one month after surgery, or as long as told by your health care provider.  Do not drive or use heavy machinery while taking prescription pain medicine.  Do not drive for 24 hours if you were given a medicine to help you relax (sedative).  Return to your normal activities as told by your health care provider. Ask  your health care provider what activities are safe for you. Lifestyle   Do not use any products that contain nicotine or tobacco, such as cigarettes and e-cigarettes. These can delay healing. If you need help quitting, ask your health care provider.  Do not drink alcohol until your health care provider approves. General instructions  Do not douche, use tampons, or have sex for at least 6 weeks, or as told by your health care provider.  Take over-the-counter and prescription medicines only as told by your health care provider.  To monitor yourself for a fever, take your temperature at least once a day during recovery.  If you struggle with physical or emotional changes after your procedure, speak with your health care provider or a therapist.  To prevent or treat constipation while you  are taking prescription pain medicine, your health care provider may recommend that you: ? Drink enough fluid to keep your urine clear or pale yellow. ? Take over-the-counter or prescription medicines. ? Eat foods that are high in fiber, such as fresh fruits and vegetables, whole grains, and beans. ? Limit foods that are high in fat and processed sugars, such as fried and sweet foods.  Keep all follow-up visits as told by your health care provider. This is important. Contact a health care provider if:  You have chills or a fever.  You have redness, swelling, or pain around an incision.  You have fluid or blood coming from an incision.  Your incision feels warm to the touch.  You have pus or a bad smell coming from an incision.  An incision breaks open.  You feel dizzy or light-headed.  You have pain or bleeding when you urinate.  You have diarrhea, nausea, or vomiting that does not go away.  You have abnormal vaginal discharge.  You have a rash.  You have pain that does not get better with medicine. Get help right away if:  You have a fever and your symptoms suddenly get worse.  You have severe abdominal pain.  You have chest pain.  You have shortness of breath.  You faint.  You have pain, swelling, or redness on your leg.  You have heavy vaginal bleeding with blood clots. Summary  After the procedure it is common to have abdominal pain. Your provider will give you medication for this.  Do not take baths, swim, or use a hot tub until your health care provider approves.  Do not lift anything that is heavier than 10 lbs (4.5 kg) for one month after surgery, or as long as told by your health care provider.  Notify your provider if you have any signs or symptoms of infection after the procedure. This information is not intended to replace advice given to you by your health care provider. Make sure you discuss any questions you have with your health care  provider. Document Revised: 03/04/2017 Document Reviewed: 06/02/2016 Elsevier Patient Education  2020 [employer].

## 2019-09-11 NOTE — Interval H&P Note (Signed)
History and Physical Interval Note:  09/11/2019 9:07 AM  Ariel Morgan  has presented today for surgery, with the diagnosis of Fibroids and Pelvic pain.  The various methods of treatment have been discussed with the patient and family. After consideration of risks, benefits and other options for treatment, the patient has consented to  Procedure(s): TOTAL LAPAROSCOPIC HYSTERECTOMY WITH SALPINGECTOMY (Bilateral) as a surgical intervention.  The patient's history has been reviewed, patient examined, no change in status, stable for surgery.  I have reviewed the patient's chart and labs.  Questions were answered to the patient's satisfaction.     Hoyt Koch

## 2019-09-11 NOTE — Anesthesia Preprocedure Evaluation (Signed)
Anesthesia Evaluation  Patient identified by MRN, date of birth, ID band Patient awake    Reviewed: Allergy & Precautions, NPO status , Patient's Chart, lab work & pertinent test results  History of Anesthesia Complications Negative for: history of anesthetic complications  Airway Mallampati: III  TM Distance: >3 FB Neck ROM: Full    Dental no notable dental hx.    Pulmonary neg sleep apnea, neg COPD, former smoker,    breath sounds clear to auscultation- rhonchi (-) wheezing      Cardiovascular Exercise Tolerance: Good (-) hypertension(-) CAD, (-) Past MI, (-) Cardiac Stents and (-) CABG  Rhythm:Regular Rate:Normal - Systolic murmurs and - Diastolic murmurs    Neuro/Psych  Headaches, neg Seizures PSYCHIATRIC DISORDERS Anxiety    GI/Hepatic Neg liver ROS, GERD  ,  Endo/Other  negative endocrine ROSneg diabetes  Renal/GU negative Renal ROS     Musculoskeletal  (+) Arthritis ,   Abdominal (+) + obese,   Peds  Hematology  (+) anemia ,   Anesthesia Other Findings Past Medical History: No date: Anemia No date: Anxiety No date: Arthritis No date: Frequent headaches     Comment:  H/O No date: GERD (gastroesophageal reflux disease) No date: Hypercholesterolemia No date: Placenta previa   Reproductive/Obstetrics                            Anesthesia Physical Anesthesia Plan  ASA: II  Anesthesia Plan: General   Post-op Pain Management:    Induction: Intravenous  PONV Risk Score and Plan: 2 and Ondansetron, Dexamethasone and Midazolam  Airway Management Planned: Oral ETT  Additional Equipment:   Intra-op Plan:   Post-operative Plan: Extubation in OR  Informed Consent: I have reviewed the patients History and Physical, chart, labs and discussed the procedure including the risks, benefits and alternatives for the proposed anesthesia with the patient or authorized representative who  has indicated his/her understanding and acceptance.     Dental advisory given  Plan Discussed with: CRNA and Anesthesiologist  Anesthesia Plan Comments:         Anesthesia Quick Evaluation

## 2019-09-11 NOTE — Transfer of Care (Signed)
Immediate Anesthesia Transfer of Care Note  Patient: Ariel Morgan  Procedure(s) Performed: TOTAL LAPAROSCOPIC HYSTERECTOMY WITH SALPINGECTOMY (Bilateral ) CYSTOSCOPY  Patient Location: PACU  Anesthesia Type:General  Level of Consciousness: drowsy and patient cooperative  Airway & Oxygen Therapy: Patient Spontanous Breathing and Patient connected to face mask oxygen  Post-op Assessment: Report given to RN and Post -op Vital signs reviewed and stable  Post vital signs: Reviewed and stable  Last Vitals:  Vitals Value Taken Time  BP 121/80 09/11/19 1116  Temp    Pulse 85 09/11/19 1120  Resp 16 09/11/19 1120  SpO2 98 % 09/11/19 1120  Vitals shown include unvalidated device data.  Last Pain:  Vitals:   09/11/19 0914  TempSrc: Tympanic  PainSc: 0-No pain         Complications: No apparent anesthesia complications

## 2019-09-11 NOTE — Anesthesia Postprocedure Evaluation (Signed)
Anesthesia Post Note  Patient: Ariel Morgan  Procedure(s) Performed: TOTAL LAPAROSCOPIC HYSTERECTOMY WITH SALPINGECTOMY (Bilateral ) CYSTOSCOPY  Patient location during evaluation: PACU Anesthesia Type: General Level of consciousness: awake and alert and oriented Pain management: pain level controlled Vital Signs Assessment: post-procedure vital signs reviewed and stable Respiratory status: spontaneous breathing, nonlabored ventilation and respiratory function stable Cardiovascular status: blood pressure returned to baseline and stable Postop Assessment: no signs of nausea or vomiting Anesthetic complications: no     Last Vitals:  Vitals:   09/11/19 1222 09/11/19 1224  BP:    Pulse: 88 88  Resp: 11 (!) 21  Temp:  36.7 C  SpO2: 94% 95%    Last Pain:  Vitals:   09/11/19 1213  TempSrc:   PainSc: 5                  Jamarques Pinedo

## 2019-09-11 NOTE — Op Note (Signed)
Operative Report:  PRE-OP DIAGNOSIS: Fibroids Pelvic pain   POST-OP DIAGNOSIS: Fibroids Pelvic pain   PROCEDURE: Procedure(s): TOTAL LAPAROSCOPIC HYSTERECTOMY WITH SALPINGECTOMY CYSTOSCOPY  SURGEON: Barnett Applebaum, MD, FACOG  ASSISTANT: Dr Georgianne Fick, No other capable assistant available, in surgery requiring high level assistant.  ANESTHESIA: General endotracheal anesthesia  ESTIMATED BLOOD LOSS: 20 mL  SPECIMENS: Uterus, Tubes.  COMPLICATIONS: None  DISPOSITION: stable to PACU  FINDINGS: Intraabdominal adhesions were noted. Fibroid uterus, Normal ovaries.  PROCEDURE:  The patient was taken to the OR where anesthesia was administed. She was prepped and draped in the normal sterile fashion in the dorsal lithotomy position in the [place] stirrups. A time out was performed. A Graves speculum was inserted, the cervix was grasped with a single tooth tenaculum and the endometrial cavity was sounded. The cervix was progressively dilated to a size 18 [place] with [employer] dilators. A V-Care uterine manipulator was inserted in the usual fashion without incident. Gloves were changed and attention was turned to the abdomen.   An infraumbilical transverse 30mm skin incision was made with the scalpel after local anesthesia applied to the skin. A Veress-step needle was inserted in the usual fashion and confirmed using the hanging drop technique. A pneumoperitoneum was obtained by insufflation of CO2 (opening pressure of 61mmHg) to 74mmHg. A diagnostic laparoscopy was performed yielding the previously described findings. Attention was turned to the left lower quadrant where after visualization of the inferior epigastric vessels a 52mm skin incision was made with the scalpel. A 5 mm laparoscopic port was inserted. The same procedure was repeated in the right lower quadrant with a 71mm trocar. Attention was turned to the left aspect of the uterus, where after visualization of the ureter, the round ligament was  coagulated and transected using the 99mm Harmonic Scapel. The anterior and posterior leafs of the broad ligament were dissected off as the anterior one was coagulated and transected in a caudal direction towards the cuff of the uterine manipulator.  Attention was then turned to the left fallopian tube which was recognized by visualization of the fimbria. The tube is excised to its attachment to the uterus. The uterine-ovarian ligament and its blood vessels were carefully coagulated and transected using the Harmonic scapel.  Attention was turned to the right aspect of the uterus where the same procedure was performed.  The vesicouterine reflection of the peritoneum was dissected with the harmonic scapel and the bladder flap was created bluntly.  The uterine vessels were coagulated and transected bilaterally using first bipolar cautery and then the harmonic scapel. A 360 degree, circumferential colpotomy was done to completely amputate the uterus with cervix and tubes. Once the specimen was amputated it was delivered through the vagina.   The colpotomy was repaired in a simple running fashion using a delayed absorbable suture with an endo-stitch device.  Vaginal exam confirms complete closure.  The cavity was copiously irrigated. A survey of the pelvic cavity revealed adequate hemostasis and no injury to bowel, bladder, or ureter.   A diagnostic cystoscopy was performed using saline distension of bladder with no lesions or injuries noted.  Bilateral urine flow from each ureteral orifice is visualized.  At this point the procedure was finalized. Right lower quadrant fascia incision is closed with a vicryl suture using the fascia closure device. All the instruments were removed from the patient's body. Gas was expelled and patient is leveled.  Incisions are closed with skin adhesive.  The assistance of my assisting-physician was vital to resect and retract  interchangably with self on each side.  Patient goes to  recovery room in stable condition.  All sponge, instrument, and needle counts are correct x2.     Barnett Applebaum, MD, [facility], [facility] 09/11/2019  11:12 AM

## 2019-09-12 ENCOUNTER — Telehealth: Payer: Self-pay

## 2019-09-12 LAB — SURGICAL PATHOLOGY

## 2019-09-12 NOTE — Telephone Encounter (Signed)
Pt pain med's  Are not covered under her insurance. Can you prescribe a different kind?

## 2019-09-13 NOTE — Telephone Encounter (Signed)
Called pt no answer, lmtrc

## 2019-09-13 NOTE — Telephone Encounter (Signed)
Can still call in different medicine if desired, see if still needs it.  (Unsure why generic oxycodone pain med is not covered, but can change to hydrocodone)

## 2019-09-19 ENCOUNTER — Other Ambulatory Visit: Payer: Self-pay

## 2019-09-19 ENCOUNTER — Ambulatory Visit (INDEPENDENT_AMBULATORY_CARE_PROVIDER_SITE_OTHER): Payer: BC Managed Care – PPO | Admitting: Obstetrics & Gynecology

## 2019-09-19 ENCOUNTER — Encounter: Payer: Self-pay | Admitting: Obstetrics & Gynecology

## 2019-09-19 VITALS — BP 120/80 | Ht 62.0 in | Wt 190.0 lb

## 2019-09-19 DIAGNOSIS — Z90710 Acquired absence of both cervix and uterus: Secondary | ICD-10-CM | POA: Insufficient documentation

## 2019-09-19 NOTE — Patient Instructions (Signed)
Thank you for choosing [facility]. As part of our ongoing efforts to improve patient experience, we would appreciate your feedback. Please fill out the short survey that you will receive by mail or MyChart. Your opinion is important to us! -Dr Vinaya Sancho  

## 2019-09-19 NOTE — Progress Notes (Signed)
  Postoperative Follow-up Patient presents post op from [facility] for fibroids and pelvic pain, 1 week ago. Path: DIAGNOSIS:  A. UTERUS WITH CERVIX AND BILATERAL FALLOPIAN TUBES; TOTAL HYSTERECTOMY  WITH BILATERAL SALPINGECTOMY:  - UTERINE CERVIX:    - BENIGN TRANSFORMATION ZONE.    - NEGATIVE FOR SQUAMOUS INTRAEPITHELIAL LESION AND MALIGNANCY.  - ENDOMETRIUM:    - INACTIVE ENDOMETRIUM.    - CHANGES SUGGESTIVE OF PRIOR ABLATION.    - NEGATIVE FOR ATYPICAL HYPERPLASIA/EIN AND MALIGNANCY.  - MYOMETRIUM:    - LEIOMYOMATA UTERI.    - NEGATIVE FOR FEATURES OF MALIGNANCY.  - FALLOPIAN TUBES:    - BENIGN PARATUBAL CYSTS.    - OTHERWISE NO SIGNIFICANT HISTOPATHOLOGIC CHANGE.  Subjective: Patient reports some improvement in her preop symptoms. Eating a regular diet without difficulty. Pain is controlled with current analgesics. Medications being used: ibuprofen (OTC).  Activity: normal activities of daily living. Patient reports additional symptom's since surgery of None.  Objective: BP 120/80   Ht 5\' 2"  (1.575 m)   Wt 190 lb (86.2 kg)   BMI 34.75 kg/m  Physical Exam Constitutional:      General: She is not in acute distress.    Appearance: She is well-developed.  Cardiovascular:     Rate and Rhythm: Normal rate.  Pulmonary:     Effort: Pulmonary effort is normal.  Abdominal:     General: There is no distension.     Palpations: Abdomen is soft.     Tenderness: There is no abdominal tenderness.     Comments: [facility]   Musculoskeletal:        General: Normal range of motion.  Neurological:     Mental Status: She is alert and oriented to person, place, and time.     Cranial Nerves: No cranial nerve deficit.  Skin:    General: Skin is warm and dry.     Assessment: s/p :  total laparoscopic hysterectomy with bilateral salpingectomy stable  Plan: Patient has done well after surgery with no apparent complications.  I have discussed the  post-operative course to date, and the expected progress moving forward.  The patient understands what complications to be concerned about.  I will see the patient in routine follow up, or sooner if needed.    Activity plan: May return to work in 7-14 days.  Pelvic rest.  Hoyt Koch 09/19/2019, 3:14 PM

## 2019-09-25 ENCOUNTER — Other Ambulatory Visit: Payer: Self-pay | Admitting: Internal Medicine

## 2019-10-05 ENCOUNTER — Other Ambulatory Visit: Payer: Self-pay | Admitting: Internal Medicine

## 2019-10-09 NOTE — Telephone Encounter (Signed)
Refill request for lyrica, last seen 06-01-19, last filled 08-02-19.  Please advise.

## 2019-10-10 ENCOUNTER — Encounter: Payer: Self-pay | Admitting: Internal Medicine

## 2019-10-10 ENCOUNTER — Other Ambulatory Visit: Payer: Self-pay

## 2019-10-10 NOTE — Telephone Encounter (Signed)
rx ok'd for lyrica #30 with 1 refill.

## 2019-10-11 ENCOUNTER — Ambulatory Visit: Payer: BC Managed Care – PPO | Admitting: Pharmacist

## 2019-10-11 ENCOUNTER — Encounter: Payer: Self-pay | Admitting: Internal Medicine

## 2019-10-11 VITALS — Wt 185.0 lb

## 2019-10-11 DIAGNOSIS — R635 Abnormal weight gain: Secondary | ICD-10-CM

## 2019-10-11 DIAGNOSIS — M79606 Pain in leg, unspecified: Secondary | ICD-10-CM

## 2019-10-11 DIAGNOSIS — Z713 Dietary counseling and surveillance: Secondary | ICD-10-CM

## 2019-10-11 DIAGNOSIS — F411 Generalized anxiety disorder: Secondary | ICD-10-CM

## 2019-10-11 DIAGNOSIS — E7800 Pure hypercholesterolemia, unspecified: Secondary | ICD-10-CM

## 2019-10-11 NOTE — Telephone Encounter (Signed)
Spoke with patient. She notes ~2 weeks of worsened leg twitching, would twitch ~every 5-10 minutes.   Notes that she started bupropion 100 mg BID per Dr. Nicolasa Ducking ~2 weeks ago, but also ran out of her pregabalin ~2 weeks ago.   Encouraged to call Dr. Waylan Boga office to report this side effects. Discussed that it may be related to "withdrawal" from pregabalin, or a serotonergic effect of escitalopram + bupropion

## 2019-10-11 NOTE — Patient Instructions (Signed)
Visit Information  Goals Addressed              This Visit's Progress     Patient Stated   .  "I'm frustrated that I can't lose any weight" (pt-stated)        CARE PLAN ENTRY (see longtitudinal plan of care for additional care plan information)  Current Barriers:  . Obesity; started on Saxenda for weight loss on 01/07/2019. Was off for a period d/t her father passing away, then restarted. Last worked up to [employer] 1.8 mg daily with weight nadir of 183 (starting weight 208, total loss 25 lbs or 12%). Reports she ran out of Saxenda ~2 months ago, and has been attempting to communicate with the pharmacy about refilling. Pharmacy notes new PA required o Current pharmacotherapy: none (still prescribed Saxenda 1.8 mg daily) o Most recent weight: ~185 lbs o Current exercise: walking when she can; s/p hysterectomy in June o Current stressors: Work; [employer] summer [employer].  . Mood: follows w/ Dr. Nicolasa Ducking; started bupropion 100 mg BID ~2 weeks ago, continues on escitalopram 20 mg daily . Peripheral neuropathy: pregabalin 50 mg daily, notes she ran out for ~2 weeks ago and restarted yesterday o Reports increased frequency of leg twitching for the past 2 weeks. Sent MyChart to PCP today regarding this concern  Pharmacist Clinical Goal(s):  Marland Kitchen Over the next 90 days, patient with work with PharmD and primary care provider to address weight loss  Interventions: . Comprehensive medication review performed, medication list updated in electronic medical record . Inter-disciplinary care team collaboration (see longitudinal plan of care) . Will complete PA for Saxenda for continuation of therapy.  . Communicated patient's current medications and recent changes w/ Dr. Nicki Reaper. Encouraged patient to call Dr. Nicolasa Ducking to report this side effect, as it may be related to starting bupropion and serotonergic effects. May also be related to being off pregabalin for 2 weeks.  . Encouraged continued dietary and lifestyle  focus. Provided counseling.  Patient Self Care Activities:  . Patient will continue to focus on healthy dietary and exercise choices. . Patient will report any questions or concerns to provider   Please see past updates related to this goal by clicking on the "Past Updates" button in the selected goal         The patient verbalized understanding of instructions provided today and declined a print copy of patient instruction materials.      Plan:  - Scheduled f/u appt in ~ 2 weeks regarding medication access  Catie [name], PharmD, [place], [place] Pharmacist [place] [phone]

## 2019-10-11 NOTE — Chronic Care Management (AMB) (Signed)
Chronic Care Management   Follow Up Note   10/11/2019 Name: Ariel Morgan MRN: [id number] DOB: [birth year]  Referred by: [name], MD Reason for referral : Chronic Care Management (Medication Management)   MERISSA Morgan is a 44 y.o. year old female who is a primary care patient of [name], MD. The CCM team was consulted for assistance with chronic disease management and care coordination needs.    Contacted patient for medication management review.   Review of patient status, including review of consultants reports, relevant laboratory and other test results, and collaboration with appropriate care team members and the patient's provider was performed as part of comprehensive patient evaluation and provision of chronic care management services.    SDOH (Social Determinants of Health) assessments performed: Yes See Care Plan activities for detailed interventions related to SDOH)  SDOH Interventions     Most Recent Value  SDOH Interventions  Physical Activity Interventions Other (Comments)  [discussed goals]       Outpatient Encounter Medications as of 10/11/2019  Medication Sig   buPROPion (WELLBUTRIN) 100 MG tablet Take 100 mg by mouth 2 (two) times daily.   escitalopram (LEXAPRO) 20 MG tablet TAKE 1 TABLET BY MOUTH EVERY DAY   pregabalin (LYRICA) 50 MG capsule TAKE 1 CAPSULE BY MOUTH EVERY DAY   fexofenadine (ALLEGRA) 60 MG tablet Take 60 mg by mouth 2 (two) times daily. (Patient not taking: Reported on 10/11/2019)   Insulin Pen Needle (PEN NEEDLES) 32G X 4 MM MISC Inject 1 pen into the skin daily. Use with Saxenda (Patient not taking: Reported on 10/11/2019)   Liraglutide -Weight Management (SAXENDA) 18 MG/3ML SOPN Inject 0.3 mLs (1.8 mg total) into the skin daily. (Patient not taking: Reported on 10/11/2019)   oxyCODONE-acetaminophen (PERCOCET/ROXICET) 5-325 MG tablet Take 1 tablet by mouth every 4 (four) hours as needed for moderate pain. (Patient not  taking: Reported on 10/11/2019)   vitamin B-12 (CYANOCOBALAMIN) 1000 MCG tablet Take 1,000 mcg by mouth daily. (Patient not taking: Reported on 10/11/2019)   zinc gluconate 50 MG tablet Take 50 mg by mouth daily. (Patient not taking: Reported on 10/11/2019)   No facility-administered encounter medications on file as of 10/11/2019.     Objective:   Goals Addressed              This Visit's Progress     Patient Stated     "I'm frustrated that I can't lose any weight" (pt-stated)        CARE PLAN ENTRY (see longtitudinal plan of care for additional care plan information)  Current Barriers:   Obesity; started on Saxenda for weight loss on 01/07/2019. Was off for a period d/t her father passing away, then restarted. Last worked up to [facility] 1.8 mg daily with weight nadir of 183 (starting weight 208, total loss 25 lbs or 12%). Reports she ran out of Saxenda ~2 months ago, and has been attempting to communicate with the pharmacy about refilling. Pharmacy notes new PA required o Current pharmacotherapy: none (still prescribed Saxenda 1.8 mg daily) o Most recent weight: ~185 lbs o Current exercise: walking when she can; s/p hysterectomy in June o Current stressors: Work; [employer] summer [employer].   Mood: follows w/ Dr. Nicolasa Ducking; started bupropion 100 mg BID ~2 weeks ago, continues on escitalopram 20 mg daily  Peripheral neuropathy: pregabalin 50 mg daily, notes she ran out for ~2 weeks ago and restarted yesterday o Reports increased frequency of leg twitching for the past 2 weeks.  Sent MyChart to PCP today regarding this concern  Pharmacist Clinical Goal(s):   Over the next 90 days, patient with work with PharmD and primary care provider to address weight loss  Interventions:  Comprehensive medication review performed, medication list updated in electronic medical record  Inter-disciplinary care team collaboration (see longitudinal plan of care)  Will complete PA for Saxenda for  continuation of therapy.   Communicated patient's current medications and recent changes w/ Dr. Nicki Reaper. Encouraged patient to call Dr. Nicolasa Ducking to report this side effect, as it may be related to starting bupropion and serotonergic effects. May also be related to being off pregabalin for 2 weeks.   Encouraged continued dietary and lifestyle focus. Provided counseling.  Patient Self Care Activities:   Patient will continue to focus on healthy dietary and exercise choices.  Patient will report any questions or concerns to provider   Please see past updates related to this goal by clicking on the "Past Updates" button in the selected goal          Plan:  - Scheduled f/u appt in ~ 2 weeks regarding medication access  Catie [name], PharmD, [place], CPP Clinical Pharmacist [place] [phone]

## 2019-10-11 NOTE — Telephone Encounter (Signed)
Please call pt and confirm no other symptoms.  If persistent or any other symptoms, she needs to be seen.

## 2019-10-11 NOTE — Telephone Encounter (Signed)
Please clarify with her all of her medications she is currently taking.  Even prn medication.  I want to make sure this is not a side effect of her medications.  (need to know all meds - even the ones that are not prescribed by me).  Also, when did symptoms start? Any other symptoms?

## 2019-10-11 NOTE — Telephone Encounter (Signed)
Called patient to confirm ok. She is not having any other symptoms. Does not feel bad. No odd sensations. Confirmed the only complaint is the leg twitching. Advised patient if she starts having any acute changes at all she needs to be evaluated ASAP per Dr Nicki Reaper and also to call and leave Dr Nicolasa Ducking a message. Pt agreed.

## 2019-10-11 NOTE — Telephone Encounter (Signed)
LMTCB

## 2019-10-12 ENCOUNTER — Ambulatory Visit: Payer: Self-pay | Admitting: Pharmacist

## 2019-10-12 DIAGNOSIS — R635 Abnormal weight gain: Secondary | ICD-10-CM

## 2019-10-12 DIAGNOSIS — Z713 Dietary counseling and surveillance: Secondary | ICD-10-CM

## 2019-10-12 MED ORDER — SAXENDA 18 MG/3ML ~~LOC~~ SOPN: PEN_INJECTOR | SUBCUTANEOUS | 2 refills | Status: DC

## 2019-10-12 MED ORDER — PEN NEEDLES 32G X 4 MM MISC: 1.0000 "pen " | Freq: Every day | 3 refills | Status: DC

## 2019-10-12 NOTE — Chronic Care Management (AMB) (Signed)
  Chronic Care Management   Note  10/12/2019 Name: Ariel Morgan MRN: [id number] DOB: 1975-05-13  [name] is a 44 y.o. year old female who is a primary care patient of [name], MD. The CCM team was consulted for assistance with chronic disease management and care coordination needs.    Received notification that Saxenda PA was approved. Contacted patient, left message notifying of this. Will send MyChart as well.   Catie [name], PharmD, [place], CPP Clinical Pharmacist [place] [phone]

## 2019-10-12 NOTE — Progress Notes (Signed)
I have reviewed the above note and agree. I was available to the pharmacist for consultation. See my chart message.  Nurse discussed concern regarding current symptoms.  Reiterated the need to contact Dr Nicolasa Ducking.  If any acute symptoms or change or worsening symptoms, needs to be evaluated.   [name], MD

## 2019-10-13 NOTE — Progress Notes (Signed)
I have reviewed the above note and agree. I was available to the pharmacist for consultation.  Carolyn Maniscalco, MD 

## 2019-10-22 ENCOUNTER — Encounter: Payer: Self-pay | Admitting: Pharmacist

## 2019-10-22 ENCOUNTER — Ambulatory Visit: Payer: BC Managed Care – PPO | Admitting: Pharmacist

## 2019-10-22 DIAGNOSIS — K219 Gastro-esophageal reflux disease without esophagitis: Secondary | ICD-10-CM

## 2019-10-22 DIAGNOSIS — E7800 Pure hypercholesterolemia, unspecified: Secondary | ICD-10-CM

## 2019-10-22 DIAGNOSIS — Z713 Dietary counseling and surveillance: Secondary | ICD-10-CM

## 2019-10-22 NOTE — Chronic Care Management (AMB) (Signed)
Chronic Care Management   Follow Up Note   10/22/2019 Name: Ariel Morgan MRN: [id number] DOB: 1976-02-11  Referred by: [name], MD Reason for referral : Chronic Care Management (Medication Management)   Ariel Morgan is a 44 y.o. year old female who is a primary care patient of [name], MD. The CCM team was consulted for assistance with chronic disease management and care coordination needs.    Care coordination completed today.   Review of patient status, including review of consultants reports, relevant laboratory and other test results, and collaboration with appropriate care team members and the patient's provider was performed as part of comprehensive patient evaluation and provision of chronic care management services.    SDOH (Social Determinants of Health) assessments performed: No See Care Plan activities for detailed interventions related to [facility])     Outpatient Encounter Medications as of 10/22/2019  Medication Sig  . buPROPion (WELLBUTRIN) 100 MG tablet Take 100 mg by mouth 2 (two) times daily.  Marland Kitchen escitalopram (LEXAPRO) 20 MG tablet TAKE 1 TABLET BY MOUTH EVERY DAY  . fexofenadine (ALLEGRA) 60 MG tablet Take 60 mg by mouth 2 (two) times daily. (Patient not taking: Reported on 10/11/2019)  . Insulin Pen Needle (PEN NEEDLES) 32G X 4 MM MISC Inject 1 pen into the skin daily. Use with Saxenda  . Liraglutide -Weight Management (SAXENDA) 18 MG/3ML SOPN Inject 1.8 mg daily. Titrate as tolerated. Max daily dose 3 mg  . oxyCODONE-acetaminophen (PERCOCET/ROXICET) 5-325 MG tablet Take 1 tablet by mouth every 4 (four) hours as needed for moderate pain. (Patient not taking: Reported on 10/11/2019)  . pregabalin (LYRICA) 50 MG capsule TAKE 1 CAPSULE BY MOUTH EVERY DAY  . vitamin B-12 (CYANOCOBALAMIN) 1000 MCG tablet Take 1,000 mcg by mouth daily. (Patient not taking: Reported on 10/11/2019)  . zinc gluconate 50 MG tablet Take 50 mg by mouth daily. (Patient not taking:  Reported on 10/11/2019)   No facility-administered encounter medications on file as of 10/22/2019.     Objective:   Goals Addressed              This Visit's Progress     Patient Stated   .  "I'm frustrated that I can't lose any weight" (pt-stated)        CARE PLAN ENTRY (see longtitudinal plan of care for additional care plan information)  Current Barriers:  . Obesity; started on Saxenda for weight loss on 01/07/2019. Was off for a period d/t her father passing away, then restarted. Last worked up to [employer] 1.8 mg daily with weight nadir of 183 (starting weight 208, total loss 25 lbs or 12%). PA resubmitted ~2 weeks ago o Current pharmacotherapy: Saxenda 1.8 mg weekly o Most recent weight: ~185 lbs o Current exercise: walking when she can; s/p hysterectomy in June o Current stressors: Work; teaching summer school.  . Mood: follows w/ Dr. Nicolasa Ducking; started bupropion 100 mg BID, escitalopram 20 mg daily . Peripheral neuropathy: pregabalin 50 mg daily  Pharmacist Clinical Goal(s):  Marland Kitchen Over the next 90 days, patient with work with PharmD and primary care provider to address weight loss  Interventions: . Comprehensive medication review performed, medication list updated in electronic medical record . Inter-disciplinary care team collaboration (see longitudinal plan of care) . Contacted pharmacy, confirmed that [place] ran through insurance. Patient picked up last week.  . Contacted patient to discuss. LVM for her to return my call at her convenience. Will send MyChart message with dosing considerations  Patient  Self Care Activities:  . Patient will continue to focus on healthy dietary and exercise choices. . Patient will report any questions or concerns to provider   Please see past updates related to this goal by clicking on the "Past Updates" button in the selected goal          Plan:  - Will collaborate w/ scheduling team to schedule f/u call with me in ~ 6 weeks  Catie  [name], PharmD, [place], [place] Pharmacist [place] [phone]

## 2019-10-22 NOTE — Patient Instructions (Signed)
Visit Information  Goals Addressed              This Visit's Progress     Patient Stated     "I'm frustrated that I can't lose any weight" (pt-stated)        CARE PLAN ENTRY (see longtitudinal plan of care for additional care plan information)  Current Barriers:   Obesity; started on Saxenda for weight loss on 01/07/2019. Was off for a period d/t her father passing away, then restarted. Last worked up to [employer] 1.8 mg daily with weight nadir of 183 (starting weight 208, total loss 25 lbs or 12%). PA resubmitted ~2 weeks ago o Current pharmacotherapy: Saxenda 1.8 mg weekly o Most recent weight: ~185 lbs o Current exercise: walking when she can; s/p hysterectomy in June o Current stressors: Work; teaching summer school.   Mood: follows w/ Dr. Nicolasa Ducking; started bupropion 100 mg BID, escitalopram 20 mg daily  Peripheral neuropathy: pregabalin 50 mg daily  Pharmacist Clinical Goal(s):   Over the next 90 days, patient with work with PharmD and primary care provider to address weight loss  Interventions:  Comprehensive medication review performed, medication list updated in electronic medical record  Inter-disciplinary care team collaboration (see longitudinal plan of care)  Contacted pharmacy, confirmed that [place] ran through insurance. Patient picked up last week.   Contacted patient to discuss. LVM for her to return my call at her convenience. Will send MyChart message with dosing considerations  Patient Self Care Activities:   Patient will continue to focus on healthy dietary and exercise choices.  Patient will report any questions or concerns to provider   Please see past updates related to this goal by clicking on the "Past Updates" button in the selected goal         The patient verbalized understanding of instructions provided today and declined a print copy of patient instruction materials.    Plan:  - Will collaborate w/ scheduling team to schedule f/u call  with me in ~ 6 weeks  Catie [name], PharmD, [place], [place] Pharmacist [place] [phone]

## 2019-10-23 ENCOUNTER — Telehealth: Payer: Self-pay | Admitting: Internal Medicine

## 2019-10-23 NOTE — Progress Notes (Signed)
I have reviewed the above note and agree. I was available to the pharmacist for consultation.  Will f/u to confirm all symptoms resolved since starting back on lyrica.    [name], MD

## 2019-10-23 NOTE — Telephone Encounter (Signed)
Opened in error

## 2019-10-23 NOTE — Chronic Care Management (AMB) (Signed)
  Care Management   Note  10/23/2019 Name: Ariel Morgan MRN: [id number] DOB: 1976-03-01  Ariel Morgan is a 44 y.o. year old female who is a primary care patient of [name], MD and is actively engaged with the care management team. I reached out to [name] by phone today to assist with re-scheduling a follow up visit with the Pharmacist  Follow up plan: Unsuccessful telephone outreach attempt made. A HIPPA compliant phone message was left for the patient providing contact information and requesting a return call.  The care management team will reach out to the patient again over the next 7 days.  If patient returns call to provider office, please advise to call [facility]  at [place], [place], [place], [place] [address] Direct Dial: [phone] [email or web address]: [email or web address]

## 2019-10-25 ENCOUNTER — Telehealth: Payer: Self-pay | Admitting: Internal Medicine

## 2019-10-25 NOTE — Telephone Encounter (Signed)
Pt was having previous concerns regarding leg twitching, etc.  See previous notes.  Restarted lyrica.  Psychiatry was going to f/u with her as well.  Need to confirm she is doing better.

## 2019-10-25 NOTE — Telephone Encounter (Signed)
-----   Message from Chauncey Mann, MD sent at 10/12/2019  7:25 AM EDT ----- Regarding: RE: follow up   We can see if the leg twitching goes away after restarting Lyrica in the next 24-48 hours. It can be a possible but not so common side effect of Wellbutrin but if the timeline coincided with the Lyrica  then we can first see for the next few days if it goes away after Lyrica restarts. If not we definitely will stop Wellbutrin. I can followup with her today. Thank you!  Cephus Shelling    ----- Message ----- From: [name], MD Sent: 10/12/2019   4:39 AM EDT To: Chauncey Mann, MD Subject: follow up                                      Ariel Morgan me with concerns regarding leg twitching.  States that for the last two weeks has had muscle twitching (legs).  Worsened in the last 24-48 hours.  She is on lexapro.  Started on wellbutrin two weeks ago.  Also apparently has been out of lyrica for the last two weeks.  She contacted me questioning if the wellbutrin could be causing these symptoms. She is supposed to be contacting you as well.  I am not sure of etiology.  Was questioning possibility of serotonin syndrome or possible reaction to just stopping lyrica.  She has restarted lyrica now.  Wanted to get your input.    Thank you for your help.   Ariel Morgan

## 2019-10-26 NOTE — Telephone Encounter (Signed)
Called patient and confirmed she is doing better. Still having some twitching when she sits but symptoms are much better.

## 2019-10-29 NOTE — Chronic Care Management (AMB) (Signed)
  Care Management   Note  10/29/2019 Name: Ariel Morgan MRN: [id number] DOB: 04-Nov-1975  Ariel Morgan is a 44 y.o. year old female who is a primary care patient of [name], MD and is actively engaged with the care management team. I reached out to [name] by phone today to assist with re-scheduling a follow up visit with the Pharmacist  Follow up plan: Telephone appointment with care management team member scheduled for:12/05/2019  [name], [place], [place], [place] [address] Direct Dial: [phone] [name]: [email or web address]

## 2019-10-30 ENCOUNTER — Ambulatory Visit (INDEPENDENT_AMBULATORY_CARE_PROVIDER_SITE_OTHER): Payer: BC Managed Care – PPO | Admitting: Obstetrics & Gynecology

## 2019-10-30 ENCOUNTER — Encounter: Payer: Self-pay | Admitting: Obstetrics & Gynecology

## 2019-10-30 ENCOUNTER — Other Ambulatory Visit: Payer: Self-pay

## 2019-10-30 VITALS — BP 120/80 | Ht 62.0 in | Wt 187.0 lb

## 2019-10-30 DIAGNOSIS — Z90710 Acquired absence of both cervix and uterus: Secondary | ICD-10-CM

## 2019-10-30 NOTE — Patient Instructions (Signed)
Thank you for choosing [facility]. As part of our ongoing efforts to improve patient experience, we would appreciate your feedback. Please fill out the short survey that you will receive by mail or MyChart. Your opinion is important to us! -Dr Kiandra Sanguinetti  

## 2019-10-30 NOTE — Progress Notes (Signed)
°  Postoperative Follow-up Patient presents post op from  [facility] for fibroids and pelvic pain, 6 weeks ago.  Subjective: Patient reports marked improvement in her preop symptoms. Eating a regular diet without difficulty. The patient is not having any pain.  Activity: normal activities of daily living. Patient reports additional symptom's since surgery of No bleeding  Objective: BP 120/80    Ht 5\' 2"  (1.575 m)    Wt 187 lb (84.8 kg)    BMI 34.20 kg/m  Physical Exam Constitutional:      General: She is not in acute distress.    Appearance: She is well-developed.  Genitourinary:     Pelvic exam was performed with patient supine.     Vagina and rectum normal.     No vaginal erythema or bleeding.     No right or left adnexal mass present.     Right adnexa not tender.     Left adnexa not tender.     Genitourinary Comments: Cervix and uterus absent. Vaginal cuff healing well.  Cardiovascular:     Rate and Rhythm: Normal rate.  Pulmonary:     Effort: Pulmonary effort is normal.  Abdominal:     General: There is no distension.     Palpations: Abdomen is soft.     Tenderness: There is no abdominal tenderness.     Comments: Incision healing well.  Musculoskeletal:        General: Normal range of motion.  Neurological:     Mental Status: She is alert and oriented to person, place, and time.     Cranial Nerves: No cranial nerve deficit.  Skin:    General: Skin is warm and dry.     Assessment: s/p :  total laparoscopic hysterectomy with bilateral salpingectomy progressing well  Plan: Patient has done well after surgery with no apparent complications.  I have discussed the post-operative course to date, and the expected progress moving forward.  The patient understands what complications to be concerned about.  I will see the patient in routine follow up, or sooner if needed.    Activity plan: No restriction.  Hoyt Koch 10/30/2019, 9:25 AM

## 2019-11-07 ENCOUNTER — Ambulatory Visit: Payer: BC Managed Care – PPO | Admitting: Obstetrics & Gynecology

## 2019-11-21 ENCOUNTER — Telehealth: Payer: BC Managed Care – PPO

## 2019-11-30 ENCOUNTER — Other Ambulatory Visit: Payer: Self-pay

## 2019-11-30 ENCOUNTER — Telehealth (INDEPENDENT_AMBULATORY_CARE_PROVIDER_SITE_OTHER): Payer: BC Managed Care – PPO | Admitting: Nurse Practitioner

## 2019-11-30 ENCOUNTER — Encounter: Payer: Self-pay | Admitting: Nurse Practitioner

## 2019-11-30 VITALS — Ht 62.0 in | Wt 175.0 lb

## 2019-11-30 DIAGNOSIS — B029 Zoster without complications: Secondary | ICD-10-CM | POA: Diagnosis not present

## 2019-11-30 MED ORDER — VALACYCLOVIR HCL 1 G PO TABS: 1000.0000 mg | ORAL_TABLET | Freq: Three times a day (TID) | ORAL | 0 refills | Status: DC

## 2019-11-30 NOTE — Progress Notes (Addendum)
Virtual Visit via Video Note  This visit type was conducted due to national recommendations for restrictions regarding the COVID-19 pandemic (e.g. social distancing).  This format is felt to be most appropriate for this patient at this time.  All issues noted in this document were discussed and addressed.  No physical exam was performed (except for noted visual exam findings with Video Visits).   I connected with@ on 12/03/19 at 11:30 AM EDT by a video enabled telemedicine application or telephone and verified that I am speaking with the correct person using two identifiers. Location patient: home Location provider: work or home office Persons participating in the virtual visit: patient, provider  I discussed the limitations, risks, security and privacy concerns of performing an evaluation and management service by telephone and the availability of in person appointments. I also discussed with the patient that there may be a patient responsible charge related to this service. The patient expressed understanding and agreed to proceed.   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  She had a second  recurrence 3 years ago in the same location.   She does not see an eye doctor at that time.  She treated this with the Valtrex.  Onset 3 days ago once again of blisters of the left temporal area, these are some mildly painful, and itchy.  She has noted no visual change.  She has noted no rash spreading to her forehead or nose or around the eyelid.  None of the lesions have been draining.  There is just red bumps at this point. No systemic illness such as fever, chills, fatigue, or any URI symptoms.  She also gets fever blisters around her lip occasionally.  ROS: See pertinent positives and negatives per HPI.  Past Medical History:  Diagnosis Date  . Anemia   .  Anxiety   . Arthritis   . Frequent headaches    H/O  . GERD (gastroesophageal reflux disease)   . Hypercholesterolemia   . Placenta previa     Past Surgical History:  Procedure Laterality Date  . ABDOMINAL EXPOSURE N/A 01/01/2018   Procedure: ABDOMINAL EXPOSURE;  Surgeon: Marty Heck, MD;  Location: [facility];  Service: Vascular;  Laterality: N/A;  . ANTERIOR LUMBAR FUSION N/A 01/01/2018   Procedure: ANTERIOR LUMBAR FUSION L5-S1; REPLACEMENT OF posterior HARDWARE L4-S1;  Surgeon: Melina Schools, MD;  Location: [facility];  Service: Orthopedics;  Laterality: N/A;  . BREAST BIOPSY Right 05/10/2017   right breast stereotatic bx path pending  . CESAREAN SECTION  2003 & 2010  . CHOLECYSTECTOMY  2005  . CYSTOSCOPY  09/11/2019   Procedure: CYSTOSCOPY;  Surgeon: Gae Dry, MD;  Location: [facility];  Service: Gynecology;;  . LUMBAR LAMINECTOMY/DECOMPRESSION MICRODISCECTOMY N/A 12/28/2017   Procedure: L4-S1 decompression and fusion;  Surgeon: Melina Schools, MD;  Location: [facility];  Service: Orthopedics;  Laterality: N/A;  4.5 hrs  . TOTAL LAPAROSCOPIC HYSTERECTOMY WITH SALPINGECTOMY Bilateral 09/11/2019   Procedure: TOTAL LAPAROSCOPIC HYSTERECTOMY WITH SALPINGECTOMY;  Surgeon: Gae Dry, MD;  Location: [facility];  Service: Gynecology;  Laterality: Bilateral;    Family History  Problem Relation Age of Onset  . Hyperlipidemia Father   . Diabetes Sister   . Diabetes Maternal Grandmother   . Cancer Maternal Grandmother        vaginal  . Cirrhosis Maternal Grandmother   . Colon  polyps Maternal Grandmother   . Breast cancer Maternal Grandmother        Early 33's    SOCIAL HX: Former smoker   Current Outpatient Medications:  .  Ascorbic Acid (VITAMIN C PO), Take by mouth., Disp: , Rfl:  .  buPROPion (WELLBUTRIN) 100 MG tablet, Take 100 mg by mouth 2 (two) times daily., Disp: , Rfl:  .  escitalopram (LEXAPRO) 20 MG tablet, TAKE 1 TABLET BY MOUTH EVERY DAY, Disp: 90 tablet, Rfl: 0 .   fexofenadine (ALLEGRA) 60 MG tablet, Take 60 mg by mouth 2 (two) times daily. , Disp: , Rfl:  .  Insulin Pen Needle (PEN NEEDLES) 32G X 4 MM MISC, Inject 1 pen into the skin daily. Use with Saxenda, Disp: 100 each, Rfl: 3 .  Liraglutide -Weight Management (SAXENDA) 18 MG/3ML SOPN, Inject 1.8 mg daily. Titrate as tolerated. Max daily dose 3 mg, Disp: 5 pen, Rfl: 2 .  pregabalin (LYRICA) 50 MG capsule, TAKE 1 CAPSULE BY MOUTH EVERY DAY, Disp: 30 capsule, Rfl: 1 .  vitamin B-12 (CYANOCOBALAMIN) 1000 MCG tablet, Take 1,000 mcg by mouth daily. , Disp: , Rfl:  .  zinc gluconate 50 MG tablet, Take 50 mg by mouth daily. , Disp: , Rfl:  .  valACYclovir (VALTREX) 1000 MG tablet, Take 1 tablet (1,000 mg total) by mouth 3 (three) times daily., Disp: 21 tablet, Rfl: 0  EXAM:  VITALS per patient if applicable:  GENERAL: alert, oriented, appears well and in no acute distress  HEENT: atraumatic, conjunctiva clear, no obvious abnormalities on inspection of external nose and ears.  Left temple with herpes zoster type rash noted  NECK: normal movements of the head and neck  LUNGS: on inspection no signs of respiratory distress, breathing rate appears normal, no obvious gross SOB, gasping or wheezing  CV: no obvious cyanosis  MS: moves all visible extremities without noticeable abnormality  PSYCH/NEURO: pleasant and cooperative, no obvious depression or anxiety, speech and thought processing grossly intact  ASSESSMENT AND PLAN:  Discussed the following assessment and plan:  Herpes zoster without complication  Herpes zoster Treat with Valtrex 1 g 3 times daily for 7 days.  Referral to ophthalmology given proximity to the eye.  Counseled regarding avoiding spread to other people.  Advised to keep the rash covered if until it is crusted.  Discuss with Dr. Nicki Reaper need for preventative antiviral medication to be taken on a daily basis if continues to have breakouts on a regular basis.   Advised:  You do  not need the Herpes Zoster vaccine called  Shingrix  for one whole year since you  would have antibodies to the zoster during this timeframe.  You can transmit herpes zoster to people who have not had chickenpox or have not had the chickenpox vaccine.  Until the rash is crusted, you should keep the rash covered and wash your hands prevent the spread of the virus to others.  You must avoid contact with pregnant women who have never had chickenpox or the chickenpox vaccine, or who have premature low birthweight babies or who are immunocompromised.  If you continue to have breakouts on a regular basis, preventative antiviral medicine can be taken on a daily basis.  He can discuss this with your PCP- Dr. Nicki Reaper.   Follow-up with [place] if the rash does not resolve as expected.  I discussed the assessment and treatment plan with the patient. The patient was provided an opportunity to ask questions and all were answered.  The patient agreed with the plan and demonstrated an understanding of the instructions.   The patient was advised to call back or seek an in-person evaluation if the symptoms worsen or if the condition fails to improve as anticipated.  I provided 10 minutes of non-face-to-face time during this encounter. Denice Paradise, NP Adult Nurse Practitioner [place] [phone]

## 2019-11-30 NOTE — Patient Instructions (Addendum)
You have what looks to be herpes zoster in the left temporal area.  This requires an eye doctor evaluation.  Please see your eye doctor today if possible.  I have ordered Valtrex 1000 mg 3 times daily for 7 days. It is best to get started on this medication within 72 hours of a breakout.   You do not need the Herpes Zoster vaccine called  Shingrix  for one whole year since you  would have antibodies to the zoster during this timeframe.  You can transmit herpes zoster to people who have not had chickenpox or have not had the chickenpox vaccine.  Until the rash is crusted, you should keep the rash covered and wash your hands prevent the spread of the virus to others.  You must avoid contact with pregnant women who have never had chickenpox or the chickenpox vaccine, or who have premature low birthweight babies or who are immunocompromised.   If you continue to have breakouts on a regular basis, preventative antiviral medicine can be taken on a daily basis.  He can discuss this with your PCP- Dr. Nicki Reaper.   Follow-up with [place] if the rash does not resolve as expected.

## 2019-12-03 ENCOUNTER — Encounter: Payer: Self-pay | Admitting: Nurse Practitioner

## 2019-12-03 NOTE — Assessment & Plan Note (Signed)
Treat with Valtrex 1 g 3 times daily for 7 days.  Referral to ophthalmology given proximity to the eye.  Counseled regarding avoiding spread to other people.  Advised to keep the rash covered if until it is crusted.  Discuss with Dr. Nicki Reaper need for preventative antiviral medication to be taken on a daily basis if continues to have breakouts on a regular basis.

## 2019-12-05 ENCOUNTER — Ambulatory Visit: Payer: BC Managed Care – PPO | Admitting: Pharmacist

## 2019-12-05 ENCOUNTER — Other Ambulatory Visit: Payer: Self-pay | Admitting: Internal Medicine

## 2019-12-05 VITALS — Wt 174.0 lb

## 2019-12-05 DIAGNOSIS — K219 Gastro-esophageal reflux disease without esophagitis: Secondary | ICD-10-CM

## 2019-12-05 DIAGNOSIS — Z713 Dietary counseling and surveillance: Secondary | ICD-10-CM

## 2019-12-05 DIAGNOSIS — F411 Generalized anxiety disorder: Secondary | ICD-10-CM

## 2019-12-05 NOTE — Chronic Care Management (AMB) (Signed)
Chronic Care Management   Follow Up Note   12/05/2019 Name: Ariel Morgan MRN: [id number] DOB: September 17, 1975  Referred by: [name], MD Reason for referral : Chronic Care Management (Medication Managemnet)   Ariel Morgan is a 44 y.o. year old female who is a primary care patient of [name], MD. The CCM team was consulted for assistance with chronic disease management and care coordination needs.    Contacted patient for medication management f/u.  Review of patient status, including review of consultants reports, relevant laboratory and other test results, and collaboration with appropriate care team members and the patient's provider was performed as part of comprehensive patient evaluation and provision of chronic care management services.    SDOH (Social Determinants of Health) assessments performed: Yes See Care Plan activities for detailed interventions related to SDOH)  SDOH Interventions     Most Recent Value  SDOH Interventions  Financial Strain Interventions Intervention Not Indicated       Outpatient Encounter Medications as of 12/05/2019  Medication Sig  . Ascorbic Acid (VITAMIN C PO) Take by mouth.  Marland Kitchen buPROPion (WELLBUTRIN) 100 MG tablet Take 100 mg by mouth 2 (two) times daily.  Marland Kitchen escitalopram (LEXAPRO) 20 MG tablet TAKE 1 TABLET BY MOUTH EVERY DAY  . fexofenadine (ALLEGRA) 60 MG tablet Take 60 mg by mouth 2 (two) times daily.   . Insulin Pen Needle (PEN NEEDLES) 32G X 4 MM MISC Inject 1 pen into the skin daily. Use with Saxenda  . Liraglutide -Weight Management (SAXENDA) 18 MG/3ML SOPN Inject 1.8 mg daily. Titrate as tolerated. Max daily dose 3 mg  . pregabalin (LYRICA) 50 MG capsule TAKE 1 CAPSULE BY MOUTH EVERY DAY  . valACYclovir (VALTREX) 1000 MG tablet Take 1 tablet (1,000 mg total) by mouth 3 (three) times daily.  . vitamin B-12 (CYANOCOBALAMIN) 1000 MCG tablet Take 1,000 mcg by mouth daily.   Marland Kitchen zinc gluconate 50 MG tablet Take 50 mg by  mouth daily.    No facility-administered encounter medications on file as of 12/05/2019.     Objective:   Goals Addressed              This Visit's Progress     Patient Stated   .  "I'm frustrated that I can't lose any weight" (pt-stated)        CARE PLAN ENTRY (see longtitudinal plan of care for additional care plan information)  Current Barriers:  . Social, financial, and community barriers:  o Note noted at this time. Reports that copay for Kirke Shaggy is now ~$3, she believes she hit her prescription deductible . Obesity; restarted Saxenda therapy 10/22/19. Baseline weight while off therapy 185 lbs. Weight nadir when previously on therapy was 183 lbs, her starting weight in October 2020 was 208 lbs. o Current pharmacotherapy: Saxenda 2.4 mg weekly, reports that she tried 3 mg dose, but caused too much stomach upset o Most recent weight: ~174 lbs, 6% weight loss from July. 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 Mood: follows w/ Dr. Nicolasa Ducking; bupropion 100 mg BID, escitalopram 20 mg daily, reports mood is well controlled . Peripheral neuropathy: pregabalin 50 mg daily  Pharmacist Clinical Goal(s):  Marland Kitchen Over the next 90 days, patient with work with PharmD and primary care provider to address weight loss  Interventions: . Comprehensive medication review performed, medication list updated in electronic medical record . Inter-disciplinary care team  collaboration (see longitudinal plan of care) . Praised for continued weight loss. Given continued success, recommend continuing therapy at 2.4 mg daily.  . Continue focus on caloric reduction through diet and target exercise of 150 minutes of moderate intensity exercise daily.   Patient Self Care Activities:  . Patient will continue to focus on healthy dietary and exercise choices. . Patient will report any questions or concerns to provider   Please see past  updates related to this goal by clicking on the "Past Updates" button in the selected goal          Plan:  - Scheduled f/u call in ~ 10 weeks  Catie [name], PharmD, [place], [place] Pharmacist [facility] [place] [phone]

## 2019-12-05 NOTE — Progress Notes (Signed)
I have reviewed the above note and agree. I was available to the pharmacist for consultation.  Patric Vanpelt, MD 

## 2019-12-05 NOTE — Patient Instructions (Signed)
Visit Information  Goals Addressed              This Visit's Progress     Patient Stated   .  "I'm frustrated that I can't lose any weight" (pt-stated)        CARE PLAN ENTRY (see longtitudinal plan of care for additional care plan information)  Current Barriers:  . Social, financial, and community barriers:  o Note noted at this time. Reports that copay for Kirke Shaggy is now ~$3, she believes she hit her prescription deductible . Obesity; restarted Saxenda therapy 10/22/19. Baseline weight while off therapy 185 lbs. Weight nadir when previously on therapy was 183 lbs, her starting weight in October 2020 was 208 lbs. o Current pharmacotherapy: Saxenda 2.4 mg weekly, reports that she tried 3 mg dose, but caused too much stomach upset o Most recent weight: ~174 lbs, 6% weight loss from July. 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 Mood: follows w/ Dr. Nicolasa Ducking; bupropion 100 mg BID, escitalopram 20 mg daily, reports mood is well controlled . Peripheral neuropathy: pregabalin 50 mg daily  Pharmacist Clinical Goal(s):  Marland Kitchen Over the next 90 days, patient with work with PharmD and primary care provider to address weight loss  Interventions: . Comprehensive medication review performed, medication list updated in electronic medical record . Inter-disciplinary care team collaboration (see longitudinal plan of care) . Praised for continued weight loss. Given continued success, recommend continuing therapy at 2.4 mg daily.  . Continue focus on caloric reduction through diet and target exercise of 150 minutes of moderate intensity exercise daily.   Patient Self Care Activities:  . Patient will continue to focus on healthy dietary and exercise choices. . Patient will report any questions or concerns to provider   Please see past updates related to this goal by clicking on the "Past Updates" button in the selected goal           The patient verbalized understanding of instructions provided today and declined a print copy of patient instruction materials.   Plan:  - Scheduled f/u call in ~ 10 weeks  Catie [name], PharmD, [place], [place] Pharmacist [place] [phone]

## 2019-12-05 NOTE — Telephone Encounter (Signed)
Refill request for lyrica, last seen 06-01-19, last filled 10-10-19.  Please advise.

## 2019-12-06 ENCOUNTER — Telehealth (INDEPENDENT_AMBULATORY_CARE_PROVIDER_SITE_OTHER): Payer: BC Managed Care – PPO | Admitting: Internal Medicine

## 2019-12-06 DIAGNOSIS — E7800 Pure hypercholesterolemia, unspecified: Secondary | ICD-10-CM | POA: Diagnosis not present

## 2019-12-06 DIAGNOSIS — M5441 Lumbago with sciatica, right side: Secondary | ICD-10-CM

## 2019-12-06 DIAGNOSIS — B029 Zoster without complications: Secondary | ICD-10-CM

## 2019-12-06 DIAGNOSIS — R634 Abnormal weight loss: Secondary | ICD-10-CM

## 2019-12-06 DIAGNOSIS — F411 Generalized anxiety disorder: Secondary | ICD-10-CM

## 2019-12-06 DIAGNOSIS — G8929 Other chronic pain: Secondary | ICD-10-CM

## 2019-12-06 MED ORDER — PREGABALIN 50 MG PO CAPS: 50.0000 mg | ORAL_CAPSULE | Freq: Two times a day (BID) | ORAL | 2 refills | Status: DC

## 2019-12-06 NOTE — Telephone Encounter (Signed)
rx sent in for lyrica #30 with 2 refills.  

## 2019-12-06 NOTE — Progress Notes (Signed)
Patient ID: Ariel Morgan, female   DOB: [birth year], 44 y.o.   MRN: [id number]   Virtual Visit via video Note  This visit type was conducted due to national recommendations for restrictions regarding the COVID-19 pandemic (e.g. social distancing).  This format is felt to be most appropriate for this patient at this time.  All issues noted in this document were discussed and addressed.  No physical exam was performed (except for noted visual exam findings with Video Visits).   I connected with Thresa Ross by a video enabled telemedicine application and verified that I am speaking with the correct person using two identifiers. Location patient: work Location provider: work  Persons participating in the virtual visit: patient, provider  The limitations, risks, security and privacy concerns of performing an evaluation and management service by video and the availability of in person appointments have been discussed.  It has also been discussed with the patient that there may be a patient responsible charge related to this service. The patient expressed understanding and agreed to proceed.   Reason for visit: follow up appt  HPI: She has been having increase back/left side pain.  Feels like nerve pinched. Lying down relieves.  Legs feel weaker.  Bothers her to walk up and down stairs.  Hard to walk.  She is sitting more.  On lyrica.  Discussed increasing dose to bid.  On saxenda.  Has lost weight.  Tolerating. No chest pain or sob reported.  No increased cough or congestion reported.  No abdominal pain.  Bowels stable.  At work.  Handling stress.  Seeing Dr Nicolasa Ducking.    ROS: See pertinent positives and negatives per HPI.  Past Medical History:  Diagnosis Date   Anemia    Anxiety    Arthritis    Frequent headaches    H/O   GERD (gastroesophageal reflux disease)    Hypercholesterolemia    Placenta previa     Past Surgical History:  Procedure Laterality Date   ABDOMINAL EXPOSURE  N/A 01/01/2018   Procedure: ABDOMINAL EXPOSURE;  Surgeon: Marty Heck, MD;  Location: [facility];  Service: Vascular;  Laterality: N/A;   ANTERIOR LUMBAR FUSION N/A 01/01/2018   Procedure: ANTERIOR LUMBAR FUSION L5-S1; REPLACEMENT OF posterior HARDWARE L4-S1;  Surgeon: Melina Schools, MD;  Location: [facility];  Service: Orthopedics;  Laterality: N/A;   BREAST BIOPSY Right 05/10/2017   right breast stereotatic bx path pending   CESAREAN SECTION  2003 & 2010   CHOLECYSTECTOMY  2005   CYSTOSCOPY  09/11/2019   Procedure: CYSTOSCOPY;  Surgeon: Gae Dry, MD;  Location: [facility];  Service: Gynecology;;   LUMBAR LAMINECTOMY/DECOMPRESSION MICRODISCECTOMY N/A 12/28/2017   Procedure: L4-S1 decompression and fusion;  Surgeon: Melina Schools, MD;  Location: [facility];  Service: Orthopedics;  Laterality: N/A;  4.5 hrs   TOTAL LAPAROSCOPIC HYSTERECTOMY WITH SALPINGECTOMY Bilateral 09/11/2019   Procedure: TOTAL LAPAROSCOPIC HYSTERECTOMY WITH SALPINGECTOMY;  Surgeon: Gae Dry, MD;  Location: [facility];  Service: Gynecology;  Laterality: Bilateral;    Family History  Problem Relation Age of Onset   Hyperlipidemia Father    Diabetes Sister    Diabetes Maternal Grandmother    Cancer Maternal Grandmother        vaginal   Cirrhosis Maternal Grandmother    Colon polyps Maternal Grandmother    Breast cancer Maternal Grandmother        Early 48's    SOCIAL HX: reviewed.    Current Outpatient Medications:    Ascorbic  Acid (VITAMIN C PO), Take by mouth., Disp: , Rfl:    buPROPion (WELLBUTRIN) 100 MG tablet, Take 100 mg by mouth 2 (two) times daily., Disp: , Rfl:    calcium-vitamin D 250-100 MG-UNIT tablet, Take 1 tablet by mouth 2 (two) times daily., Disp: , Rfl:    escitalopram (LEXAPRO) 20 MG tablet, TAKE 1 TABLET BY MOUTH EVERY DAY, Disp: 90 tablet, Rfl: 0   fexofenadine (ALLEGRA) 60 MG tablet, Take 60 mg by mouth 2 (two) times daily. , Disp: , Rfl:    Insulin Pen Needle (PEN  NEEDLES) 32G X 4 MM MISC, Inject 1 pen into the skin daily. Use with Saxenda, Disp: 100 each, Rfl: 3   Liraglutide -Weight Management (SAXENDA) 18 MG/3ML SOPN, Inject 1.8 mg daily. Titrate as tolerated. Max daily dose 3 mg, Disp: 5 pen, Rfl: 2   pregabalin (LYRICA) 50 MG capsule, Take 1 capsule (50 mg total) by mouth 2 (two) times daily., Disp: 60 capsule, Rfl: 2   valACYclovir (VALTREX) 1000 MG tablet, Take 1 tablet (1,000 mg total) by mouth 3 (three) times daily., Disp: 21 tablet, Rfl: 0   vitamin B-12 (CYANOCOBALAMIN) 1000 MCG tablet, Take 1,000 mcg by mouth daily. , Disp: , Rfl:    zinc gluconate 50 MG tablet, Take 50 mg by mouth daily. , Disp: , Rfl:   EXAM:  GENERAL: alert, oriented, appears well and in no acute distress  HEENT: atraumatic, conjunttiva clear, no obvious abnormalities on inspection of external nose and ears  NECK: normal movements of the head and neck  LUNGS: on inspection no signs of respiratory distress, breathing rate appears normal, no obvious gross SOB, gasping or wheezing  CV: no obvious cyanosis  PSYCH/NEURO: pleasant and cooperative, no obvious depression or anxiety, speech and thought processing grossly intact  ASSESSMENT AND PLAN:  Discussed the following assessment and plan:  Weight loss On saxenda.  Doing well.  Has lost weight.  Follow.   Low back pain S/p surgery.  Sees ortho. Increased pain left side as outlined.  Increase lyrica to bid.  Follow.  Continue f/u with ortho.   Hypercholesterolemia Low choelsterol diet and exercise.  Follow lipid panel.   Herpes zoster Recently diagnosed.  Better.  Saw ophthalmology.  No eye involvement.  Follow.     GAD (generalized anxiety disorder) Being followed by Dr Nicolasa Ducking.  Continues on lexapro and wellbutrin.  Follow.    Orders Placed This Encounter  Procedures   Comprehensive metabolic panel    Standing Status:   Future    Standing Expiration Date:   12/15/2020   TSH    Standing Status:    Future    Standing Expiration Date:   12/15/2020   Lipid panel    Standing Status:   Future    Standing Expiration Date:   12/15/2020    Meds ordered this encounter  Medications   pregabalin (LYRICA) 50 MG capsule    Sig: Take 1 capsule (50 mg total) by mouth 2 (two) times daily.    Dispense:  60 capsule    Refill:  2    Cancel rx for lyrica q day.  Changing to bid.     I discussed the assessment and treatment plan with the patient. The patient was provided an opportunity to ask questions and all were answered. The patient agreed with the plan and demonstrated an understanding of the instructions.   The patient was advised to call back or seek an in-person evaluation if the symptoms worsen  or if the condition fails to improve as anticipated.   [name], MD

## 2019-12-07 ENCOUNTER — Other Ambulatory Visit: Payer: Self-pay | Admitting: Orthopedic Surgery

## 2019-12-07 ENCOUNTER — Telehealth: Payer: Self-pay | Admitting: Internal Medicine

## 2019-12-07 DIAGNOSIS — M259 Joint disorder, unspecified: Secondary | ICD-10-CM

## 2019-12-07 NOTE — Telephone Encounter (Signed)
Lm to schedule cpe in 70m and labs before

## 2019-12-13 ENCOUNTER — Telehealth: Payer: Self-pay | Admitting: Internal Medicine

## 2019-12-13 ENCOUNTER — Encounter: Payer: Self-pay | Admitting: Emergency Medicine

## 2019-12-13 ENCOUNTER — Other Ambulatory Visit: Payer: Self-pay

## 2019-12-13 ENCOUNTER — Ambulatory Visit
Admission: EM | Admit: 2019-12-13 | Discharge: 2019-12-13 | Disposition: A | Discharge status: Home / Self Care | Payer: BC Managed Care – PPO

## 2019-12-13 DIAGNOSIS — G43801 Other migraine, not intractable, with status migrainosus: Secondary | ICD-10-CM | POA: Diagnosis not present

## 2019-12-13 MED ORDER — DEXAMETHASONE SODIUM PHOSPHATE 10 MG/ML IJ SOLN
10.0000 mg | Freq: Once | INTRAMUSCULAR | Status: AC
  Administered 2019-12-13: 10 mg via INTRAMUSCULAR

## 2019-12-13 MED ORDER — KETOROLAC TROMETHAMINE 60 MG/2ML IM SOLN
60.0000 mg | Freq: Once | INTRAMUSCULAR | Status: AC
  Administered 2019-12-13: 60 mg via INTRAMUSCULAR

## 2019-12-13 MED ORDER — SUMATRIPTAN SUCCINATE 6 MG/0.5ML ~~LOC~~ SOLN
6.0000 mg | Freq: Once | SUBCUTANEOUS | Status: AC
  Administered 2019-12-13: 6 mg via SUBCUTANEOUS

## 2019-12-13 NOTE — Discharge Instructions (Addendum)
We have given you a migraine cocktail in the office today that consisted of a steroid, Toradol which is a pain reliever, and Imitrex.  Your symptoms should resolve with these medication  If you are experiencing worse headache of your life, I would have you go to the ER  Follow-up with the ER for trouble swallowing, trouble breathing, other concerning symptoms

## 2019-12-13 NOTE — ED Triage Notes (Signed)
Patient in [place] today c/o migraine for 8days on and off no vomiting or nausea   OTC: Excedrin and Tylenol, Ibuprofen
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
## 2019-12-13 NOTE — Telephone Encounter (Signed)
Pt states that she has had a migraine for eight days. There are no appts avail this week. Advised pt that she could go to the urgent care and she wants PCP to tell her what to do. Please call back to advise.

## 2019-12-14 NOTE — Telephone Encounter (Signed)
Pt was evaluated at urgent care. Feeling better today than she has this week.

## 2019-12-16 ENCOUNTER — Encounter: Payer: Self-pay | Admitting: Internal Medicine

## 2019-12-16 NOTE — Assessment & Plan Note (Signed)
Low choelsterol diet and exercise.  Follow lipid panel.

## 2019-12-16 NOTE — Assessment & Plan Note (Signed)
Recently diagnosed.  Better.  Saw ophthalmology.  No eye involvement.  Follow.

## 2019-12-16 NOTE — Assessment & Plan Note (Signed)
Being followed by Dr Nicolasa Ducking.  Continues on lexapro and wellbutrin.  Follow.

## 2019-12-16 NOTE — Assessment & Plan Note (Signed)
On saxenda.  Doing well.  Has lost weight.  Follow.

## 2019-12-16 NOTE — Assessment & Plan Note (Signed)
S/p surgery.  Sees ortho. Increased pain left side as outlined.  Increase lyrica to bid.  Follow.  Continue f/u with ortho.

## 2019-12-17 NOTE — Telephone Encounter (Signed)
Reviewed my chart message.  We just increased her lyrica - which should help if related to nerve pain.  Is she taking anything else?  Also, given she has seen Dr Manuella Ghazi and has a f/u app with him on Monday, I would recommend also calling him and seeing if he has other suggestions as well.

## 2019-12-25 ENCOUNTER — Other Ambulatory Visit: Payer: BC Managed Care – PPO

## 2020-01-11 ENCOUNTER — Ambulatory Visit
Admission: RE | Admit: 2020-01-11 | Discharge: 2020-01-11 | Disposition: A | Discharge status: Home / Self Care | Payer: BC Managed Care – PPO | Source: Ambulatory Visit | Attending: Orthopedic Surgery | Admitting: Orthopedic Surgery

## 2020-01-11 DIAGNOSIS — M259 Joint disorder, unspecified: Secondary | ICD-10-CM

## 2020-01-11 MED ORDER — METHYLPREDNISOLONE ACETATE 40 MG/ML INJ SUSP (RADIOLOG
120.0000 mg | Freq: Once | INTRAMUSCULAR | Status: AC
  Administered 2020-01-11: 120 mg via INTRA_ARTICULAR

## 2020-02-11 ENCOUNTER — Telehealth: Payer: Self-pay | Admitting: Pharmacist

## 2020-02-11 ENCOUNTER — Telehealth: Payer: BC Managed Care – PPO

## 2020-02-11 NOTE — Progress Notes (Signed)
  Chronic Care Management   Note  02/11/2020 Name: DAIL MEECE MRN: [id number] DOB: 1975-04-15   Attempted to contact patient for scheduled appointment for medication management support. Left HIPAA compliant message for patient to return my call at their convenience.    Plan: - If I do not hear back from the patient by end of business today, will collaborate with Care Guide to outreach to schedule follow up with me  Catie [name], PharmD, [place], [place] Pharmacist [place] [phone]

## 2020-02-15 NOTE — Telephone Encounter (Signed)
Pt has been r/s  

## 2020-02-26 NOTE — Telephone Encounter (Signed)
Called pt back to follow up on appt scheduling  Pt said that she would call back to schedule

## 2020-03-07 ENCOUNTER — Other Ambulatory Visit: Payer: Self-pay | Admitting: Internal Medicine

## 2020-03-07 NOTE — Telephone Encounter (Signed)
rx sent in for lyrica #60 with one refill.

## 2020-03-12 ENCOUNTER — Ambulatory Visit: Payer: BC Managed Care – PPO | Admitting: Pharmacist

## 2020-03-12 VITALS — Wt 168.0 lb

## 2020-03-12 DIAGNOSIS — R634 Abnormal weight loss: Secondary | ICD-10-CM

## 2020-03-12 DIAGNOSIS — F411 Generalized anxiety disorder: Secondary | ICD-10-CM

## 2020-03-12 DIAGNOSIS — Z713 Dietary counseling and surveillance: Secondary | ICD-10-CM

## 2020-03-12 DIAGNOSIS — G8929 Other chronic pain: Secondary | ICD-10-CM

## 2020-03-12 NOTE — Patient Instructions (Signed)
Visit Information  Patient Care Plan: Medication Management    Problem Identified: Obesity, Migraines, Depression     Long-Range Goal: Disease Progression Prevention   This Visit's Progress: On track  Priority: High  Note:   Current Barriers:  . Unable to maintain control of weight  Pharmacist Clinical Goal(s):  Marland Kitchen Over the next 90 days, patient will achieve adherence to monitoring guidelines and medication adherence to achieve therapeutic efficacy. through collaboration with PharmD and provider.  . Over the next 90 days, patient will achieve and maintain goal weight loss of 5-10% of baseline body weight  Interventions: . Inter-disciplinary care team collaboration (see longitudinal plan of care) . Comprehensive medication review performed; medication list updated in electronic medical record  Obesity: . Progressing with goal of weight loss; Current treatment: Saxenda 3 mg daily . Baseline weight prior to GLP1 therapy: 208 lbs. Most recent home weight: 168 lbs. 19% weight loss thus far on thearpy  . Reports that she has reached a weight plateau. Notes that cravings for sweets has increased recently . Current exercise: active job as a Pharmacist, hospital  . Success w/ GLP1 therapy, appropriately to continue. Discussed transition to [facility] [address] to reduce injection burden, and may provide enhanced weight loss. Will discuss w/ PCP and will pursue PA and coverage if Dr. Nicki Reaper in agreement  Depression: . Moderately well managed. Current regimen: escitalopram 20 mg daily, venlafaxine XR 75 mg daily. Reports the plan was to cross taper escitalopram to venlafaxine, but she notes that she immediately felt bad after reducing escitalopram so resumed 20 mg, continued venlafaxine. Notes the current plan is to continue both and re-address cross taper after the holidays . Counseled on risk of serotonin syndrome with multiple serotonergic agents. Encouraged continued follow up with psych.   Peripheral  Neuropathy: . Controlled; Current treatment: pregabalin 50 mg BID . Continue current regimen at this time  Migraines: . Moderately well managed; current treatment: propranolol ER 60 mg daily, rizatriptan 10 mg PRN. Reports reduced migraine frequency and improvement in symptoms w/ triptan use.  Marland Kitchen Recommended to continue current regimen and collaboration w/ neurology   Patient Goals/Self-Care Activities . Over the next 90 days, patient will:  - take medications as prescribed target a minimum of 150 minutes of moderate intensity exercise weekly engage in dietary modifications by continuing to monitor portion sizes  Follow Up Plan: Telephone follow up appointment with care management team member scheduled for:~ 6 weeks     The patient verbalized understanding of instructions, educational materials, and care plan provided today and declined offer to receive copy of patient instructions, educational materials, and care plan.   Plan: Telephone follow up appointment with care management team member scheduled for:~ 6 weeks  Catie [name], PharmD, [place], [place] Pharmacist [place] [phone]

## 2020-03-12 NOTE — Chronic Care Management (AMB) (Signed)
**Note Ariel-Identified via Obfuscation** Care Management   Pharmacy Note  03/12/2020 Name: Ariel Morgan MRN: [id number] DOB: [birth year]   Subjective:  Ariel Morgan is a 44 y.o. year old female who is a primary care Morgan of Ariel Morgan. The Care Management/Care Coordination team team was consulted for assistance with care management and care coordination needs.    Engaged with Morgan by telephone for follow up visit in response to provider referral for pharmacy case management and/or care coordination services.   Consent to Services:  Ariel Morgan was given information about care management/care coordination services, agreed to services, and gave verbal consent prior to initiation of services. Please see initial visit note for detailed documentation.   Review of Morgan status, including review of consultants reports, laboratory and other test data, was performed as part of comprehensive evaluation and provision of chronic care management services.   SDOH (Social Determinants of Health) assessments and interventions performed:  SDOH Interventions     Most Recent Value  SDOH Interventions  SDOH Interventions for the Following Domains Physical Activity  Financial Strain Interventions Intervention Not Indicated  Physical Activity Interventions Other (Comments)  [encouraged 150 minutes of moderate intensity exercise weekly]       Objective:  Lab Results  Component Value Date   CREATININE 0.63 05/23/2019   CREATININE 0.74 12/08/2018   CREATININE 0.83 12/19/2017        Component Value Date/Time   CHOL 166 12/08/2018 1355   TRIG 76.0 12/08/2018 1355   HDL 62.00 12/08/2018 1355   CHOLHDL 3 12/08/2018 1355   VLDL 15.2 12/08/2018 1355   [place] 88 12/08/2018 1355   LDLDIRECT 132.6 01/29/2013 0837    Clinical ASCVD: No  The 10-year ASCVD risk score Ariel Morgan) is: 0.4%   Values used to calculate the score:     Age: 55 years     Sex: Female     Is Non-Hispanic African American:  No     Diabetic: No     Tobacco smoker: No     Systolic Blood Pressure: 237 mmHg     Is BP treated: No     HDL Cholesterol: 62 mg/dL     Total Cholesterol: 166 mg/dL     BP Readings from Last 3 Encounters:  12/13/19 122/87  10/30/19 120/80  09/19/19 120/80    Assessment:   No Known Allergies  Medications Reviewed Today    Reviewed by Ariel Morgan, RPH-CPP (Pharmacist) on 03/12/20 at [place] List Status: <None>  Medication Order Taking? Sig Documenting Provider Last Dose Status Informant  Ascorbic Acid (VITAMIN C PO) [id number] Yes Take by mouth. Provider, Historical, Morgan Taking Morgan   calcium-vitamin D 250-100 MG-UNIT tablet [id number] Yes Take 1 tablet by mouth 2 (two) times daily. Provider, Historical, Morgan Taking Morgan   escitalopram (LEXAPRO) 20 MG tablet [id number] Yes TAKE 1 TABLET BY MOUTH EVERY DAY Ariel Morgan Taking Morgan   fexofenadine (ALLEGRA) 60 MG tablet [id number] Yes Take 60 mg by mouth 2 (two) times daily.  Provider, Historical, Morgan Taking Morgan   Insulin Pen Needle (PEN NEEDLES) 32G X 4 MM MISC [id number] Yes Inject 1 pen into the skin daily. Use with Ariel Handy, Ariel Morgan Taking Morgan   Liraglutide -Weight Management (SAXENDA) 18 MG/3ML SOPN [id number] Yes Inject 1.8 mg daily. Titrate as tolerated. Max daily dose 3 mg Ariel Morgan Taking Morgan   pregabalin (LYRICA) 50 MG capsule [id number] Yes TAKE 1 CAPSULE (50 MG TOTAL) BY  MOUTH 2 (TWO) TIMES DAILY. Ariel Morgan Taking Morgan   propranolol ER (INDERAL LA) 60 MG 24 hr capsule [id number] Yes Take 60 mg by mouth at bedtime. Provider, Historical, Morgan Taking Morgan   rizatriptan (MAXALT) 10 MG tablet [id number] Yes Take 10 mg by mouth daily as needed. Provider, Historical, Morgan Taking Morgan   valACYclovir (VALTREX) 1000 MG tablet [id number] Yes Take 1 tablet (1,000 mg total) by mouth 3 (three) times daily. Ariel Regal, NP Taking Morgan   venlafaxine XR (EFFEXOR-XR) 75 MG 24 hr capsule  [id number] Yes Take 75 mg by mouth daily. Provider, Historical, Morgan Taking Morgan   vitamin B-12 (CYANOCOBALAMIN) 1000 MCG tablet [id number] Yes Take 1,000 mcg by mouth daily.  Provider, Historical, Morgan Taking Morgan Self  zinc gluconate 50 MG tablet [id number] Yes Take 50 mg by mouth daily.  Provider, Historical, Morgan Taking Morgan Self          Morgan Morgan Problem List   Diagnosis Date Noted  . S/P laparoscopic hysterectomy 09/19/2019  . Fibroid 05/28/2019  . RLQ abdominal pain 05/28/2019  . Leg pain 02/25/2019  . Weight loss 11/19/2018  . Surgery, elective   . Hyperlipidemia   . Anxiety state   . Acute blood loss anemia   . Post-operative pain   . FUO (fever of unknown origin)   . S/P lumbar fusion 12/28/2017  . Pre-op evaluation 12/11/2017  . Abnormal mammogram 07/23/2017  . Microcalcification of right breast on mammogram 05/04/2017  . Low back pain 06/17/2015  . Health care maintenance 03/15/2015  . Herpes zoster 09/16/2014  . GAD (generalized anxiety disorder) 12/03/2012  . GERD (gastroesophageal reflux disease) 12/03/2012  . Headache 12/03/2012  . Hypercholesterolemia 12/03/2012    Medication Assistance: None required. Morgan affirms current coverage meets needs.   Morgan Care Plan: Medication Management    Problem Identified: Obesity, Migraines, Depression     Long-Range Goal: Disease Progression Prevention   This Visit's Progress: On track  Priority: High  Note:   Current Barriers:  . Unable to maintain control of weight  Pharmacist Clinical Goal(s):  Marland Kitchen Over the next 90 days, Morgan will achieve adherence to monitoring guidelines and medication adherence to achieve therapeutic efficacy. through collaboration with PharmD and provider.  . Over the next 90 days, Morgan will achieve and maintain goal weight loss of 5-10% of baseline body weight  Interventions: . Inter-disciplinary care team collaboration (see longitudinal plan of care) . Comprehensive  medication review performed; medication list updated in electronic medical record  Obesity: . Progressing with goal of weight loss; Current treatment: Saxenda 3 mg daily . Baseline weight prior to GLP1 therapy: 208 lbs. Most recent home weight: 168 lbs. 19% weight loss thus far on thearpy  . Reports that she has reached a weight plateau. Notes that cravings for sweets has increased recently . Current exercise: Morgan job as a Pharmacist, hospital  . Success w/ GLP1 therapy, appropriately to continue. Discussed transition to [employer] to reduce injection burden, and may provide enhanced weight loss. Will discuss w/ PCP and will pursue PA and coverage if Dr. Nicki Reaper in agreement  Depression: . Moderately well managed. Current regimen: escitalopram 20 mg daily, venlafaxine XR 75 mg daily. Reports the plan was to cross taper escitalopram to venlafaxine, but she notes that she immediately felt bad after reducing escitalopram so resumed 20 mg, continued venlafaxine. Notes the current plan is to continue both and re-address cross taper after the holidays . Counseled on risk of serotonin syndrome with  multiple serotonergic agents. Encouraged continued follow up with psych.   Peripheral Neuropathy: . Controlled; Current treatment: pregabalin 50 mg BID . Continue current regimen at this time  Migraines: . Moderately well managed; current treatment: propranolol ER 60 mg daily, rizatriptan 10 mg PRN. Reports reduced migraine frequency and improvement in symptoms w/ triptan use.  Marland Kitchen Recommended to continue current regimen and collaboration w/ neurology   Morgan Goals/Self-Care Activities . Over the next 90 days, Morgan will:  - take medications as prescribed target a minimum of 150 minutes of moderate intensity exercise weekly engage in dietary modifications by continuing to monitor portion sizes  Follow Up Plan: Telephone follow up appointment with care management team member scheduled for:~ 6 weeks      Plan:  Telephone follow up appointment with care management team member scheduled for:~ 6 weeks  Catie [name], PharmD, [place], CPP Clinical Pharmacist [place] [phone]

## 2020-03-14 ENCOUNTER — Ambulatory Visit: Payer: BC Managed Care – PPO | Admitting: Pharmacist

## 2020-03-14 DIAGNOSIS — Z713 Dietary counseling and surveillance: Secondary | ICD-10-CM

## 2020-03-14 DIAGNOSIS — K219 Gastro-esophageal reflux disease without esophagitis: Secondary | ICD-10-CM

## 2020-03-14 MED ORDER — SEMAGLUTIDE-WEIGHT MANAGEMENT 1 MG/0.5ML ~~LOC~~ SOAJ
1.0000 mg | SUBCUTANEOUS | 0 refills | Status: DC
Start: 2020-05-11 — End: 2020-04-16

## 2020-03-14 MED ORDER — SEMAGLUTIDE-WEIGHT MANAGEMENT 1.7 MG/0.75ML ~~LOC~~ SOAJ: 1.7000 mg | SUBCUTANEOUS | 0 refills | Status: DC

## 2020-03-14 MED ORDER — SEMAGLUTIDE-WEIGHT MANAGEMENT 2.4 MG/0.75ML ~~LOC~~ SOAJ: 2.4000 mg | SUBCUTANEOUS | 1 refills | Status: DC

## 2020-03-14 NOTE — Patient Instructions (Signed)
Visit Information  Patient Care Plan: Medication Management    Problem Identified: Obesity, Migraines, Depression     Long-Range Goal: Disease Progression Prevention   Recent Progress: On track  Priority: High  Note:   Current Barriers:  . Unable to maintain control of weight  Pharmacist Clinical Goal(s):  Marland Kitchen Over the next 90 days, patient will achieve adherence to monitoring guidelines and medication adherence to achieve therapeutic efficacy. through collaboration with PharmD and provider.  . Over the next 90 days, patient will achieve and maintain goal weight loss of 5-10% of baseline body weight  Interventions: . Inter-disciplinary care team collaboration (see longitudinal plan of care) . Comprehensive medication review performed; medication list updated in electronic medical record  Obesity: . Progressing with goal of weight loss; Current treatment: Saxenda 3 mg daily . Baseline weight prior to GLP1 therapy: 208 lbs. Most recent home weight: 168 lbs. 19% weight loss thus far on thearpy  . Reports that she has reached a weight plateau. Notes that cravings for sweets has increased recently . 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 D/c Saxenda. Start Wegovy 1 mg weekly. Can titrate 1.7 and then 2.4 mg doses at monthly intervals as tolerated  Depression: . Moderately well managed. Current regimen: escitalopram 20 mg daily, venlafaxine XR 75 mg daily. Reports the plan was to cross taper escitalopram to venlafaxine, but she notes that she immediately felt bad after reducing escitalopram so resumed 20 mg, continued venlafaxine. Notes the current plan is to continue both and re-address cross taper after the holidays . Counseled on risk of serotonin syndrome with multiple serotonergic agents. Encouraged continued follow up with psych.   Peripheral Neuropathy: . Controlled; Current treatment: pregabalin 50 mg BID . Continue current regimen at this  time  Migraines: . Moderately well managed; current treatment: propranolol ER 60 mg daily, rizatriptan 10 mg PRN. Reports reduced migraine frequency and improvement in symptoms w/ triptan use.  Marland Kitchen Recommended to continue current regimen and collaboration w/ neurology   Patient Goals/Self-Care Activities . Over the next 90 days, patient will:  - take medications as prescribed target a minimum of 150 minutes of moderate intensity exercise weekly engage in dietary modifications by continuing to monitor portion sizes  Follow Up Plan: Telephone follow up appointment with care management team member scheduled for:~ 6 weeks    The patient verbalized understanding of instructions, educational materials, and care plan provided today and declined offer to receive copy of patient instructions, educational materials, and care plan.   Plan: Telephone follow up appointment with care management team member scheduled for: ~ 6 weeks  Catie [name], PharmD, [place], [place] Pharmacist [place] [phone]

## 2020-03-14 NOTE — Chronic Care Management (AMB) (Signed)
**Note Ariel-Identified via Obfuscation** Care Management   Pharmacy Note  03/14/2020 Name: Ariel Morgan MRN: [id number] DOB: 13-Jun-1975   Subjective:  Ariel Morgan is a 44 y.o. year old female who is a primary care Morgan of Ariel Pheasant, MD. The Care Management/Care Coordination team team was consulted for assistance with care management and care coordination needs.    Engaged with Morgan by telephone for follow up visit in response to provider referral for pharmacy case management and/or care coordination services.   Consent to Services:  Ariel Morgan was given information about care management/care coordination services, agreed to services, and gave verbal consent prior to initiation of services. Please see initial visit note for detailed documentation.   Review of Morgan status, including review of consultants reports, laboratory and other test data, was performed as part of comprehensive evaluation and provision of chronic care management services.   SDOH (Social Determinants of Health) assessments and interventions performed:    Objective:  Lab Results  Component Value Date   CREATININE 0.63 05/23/2019   CREATININE 0.74 12/08/2018   CREATININE 0.83 12/19/2017        Component Value Date/Time   CHOL 166 12/08/2018 1355   TRIG 76.0 12/08/2018 1355   HDL 62.00 12/08/2018 1355   CHOLHDL 3 12/08/2018 1355   VLDL 15.2 12/08/2018 1355   LDLCALC 88 12/08/2018 1355   LDLDIRECT 132.6 01/29/2013 0837     BP Readings from Last 3 Encounters:  12/13/19 122/87  10/30/19 120/80  09/19/19 120/80    Assessment:   No Known Allergies  Medications Reviewed Today    Reviewed by Ariel Morgan, RPH-CPP (Pharmacist) on 03/12/20 at [place] List Status: <None>  Medication Order Taking? Sig Documenting Provider Last Dose Status Informant  Ascorbic Acid (VITAMIN C PO) [id number] Yes Take by mouth. [provider] Taking Active   calcium-vitamin D 250-100 MG-UNIT tablet [id number] Yes Take 1  tablet by mouth 2 (two) times daily. [provider] Taking Active   escitalopram (LEXAPRO) 20 MG tablet [id number] Yes TAKE 1 TABLET BY MOUTH EVERY DAY Ariel Pheasant, MD Taking Active   fexofenadine (ALLEGRA) 60 MG tablet [id number] Yes Take 60 mg by mouth 2 (two) times daily.  [provider] Taking Active   Insulin Pen Needle (PEN NEEDLES) 32G X 4 MM MISC [id number] Yes Inject 1 pen into the skin daily. Use with Ariel Morgan, Ariel Patient, MD Taking Active   Liraglutide -Weight Management (SAXENDA) 18 MG/3ML SOPN [id number] Yes Inject 1.8 mg daily. Titrate as tolerated. Max daily dose 3 mg Ariel Pheasant, MD Taking Active   pregabalin (LYRICA) 50 MG capsule [id number] Yes TAKE 1 CAPSULE (50 MG TOTAL) BY MOUTH 2 (TWO) TIMES DAILY. Ariel Pheasant, MD Taking Active   propranolol ER (INDERAL LA) 60 MG 24 hr capsule [id number] Yes Take 60 mg by mouth at bedtime. [provider] Taking Active   rizatriptan (MAXALT) 10 MG tablet [id number] Yes Take 10 mg by mouth daily as needed. [provider] Taking Active   valACYclovir (VALTREX) 1000 MG tablet [id number] Yes Take 1 tablet (1,000 mg total) by mouth 3 (three) times daily. Ariel Regal, NP Taking Active   venlafaxine XR (EFFEXOR-XR) 75 MG 24 hr capsule [id number] Yes Take 75 mg by mouth daily. [provider] Taking Active   vitamin B-12 (CYANOCOBALAMIN) 1000 MCG tablet [id number] Yes Take 1,000 mcg by mouth daily.  [provider] Taking Active Self  zinc gluconate 50 MG tablet [id number] Yes Take 50 mg by mouth  daily.  [provider] Taking Active Self          Morgan Active Problem List   Diagnosis Date Noted  . S/P laparoscopic hysterectomy 09/19/2019  . Fibroid 05/28/2019  . RLQ abdominal pain 05/28/2019  . Leg pain 02/25/2019  . Weight loss 11/19/2018  . Surgery, elective   . Hyperlipidemia   . Anxiety state   . Acute blood loss anemia   . Post-operative pain   . FUO (fever  of unknown origin)   . S/P lumbar fusion 12/28/2017  . Pre-op evaluation 12/11/2017  . Abnormal mammogram 07/23/2017  . Microcalcification of right breast on mammogram 05/04/2017  . Low back pain 06/17/2015  . Health care maintenance 03/15/2015  . Herpes zoster 09/16/2014  . GAD (generalized anxiety disorder) 12/03/2012  . GERD (gastroesophageal reflux disease) 12/03/2012  . Headache 12/03/2012  . Hypercholesterolemia 12/03/2012    Medication Assistance: None required. Morgan affirms current coverage meets needs.   Morgan Care Plan: Medication Management    Problem Identified: Obesity, Migraines, Depression     Long-Range Goal: Disease Progression Prevention   Recent Progress: On track  Priority: High  Note:   Current Barriers:  . Unable to maintain control of weight  Pharmacist Clinical Goal(s):  Marland Kitchen Over the next 90 days, Morgan will achieve adherence to monitoring guidelines and medication adherence to achieve therapeutic efficacy. through collaboration with PharmD and provider.  . Over the next 90 days, Morgan will achieve and maintain goal weight loss of 5-10% of baseline body weight  Interventions: . Inter-disciplinary care team collaboration (see longitudinal plan of care) . Comprehensive medication review performed; medication list updated in electronic medical record  Obesity: . Progressing with goal of weight loss; Current treatment: Saxenda 3 mg daily . Baseline weight prior to GLP1 therapy: 208 lbs. Most recent home weight: 168 lbs. 19% weight loss thus far on thearpy  . Reports that she has reached a weight plateau. Notes that cravings for sweets has increased recently . Current exercise: active job as a Pharmacist, hospital  . PCP in agreement to switch to [facility]. D/c Saxenda. Start Wegovy 1 mg weekly. Can titrate 1.7 and then 2.4 mg doses at monthly intervals as tolerated  Depression: . Moderately well managed. Current regimen: escitalopram 20 mg daily, venlafaxine XR  75 mg daily. Reports the plan was to cross taper escitalopram to venlafaxine, but she notes that she immediately felt bad after reducing escitalopram so resumed 20 mg, continued venlafaxine. Notes the current plan is to continue both and re-address cross taper after the holidays . Counseled on risk of serotonin syndrome with multiple serotonergic agents. Encouraged continued follow up with psych.   Peripheral Neuropathy: . Controlled; Current treatment: pregabalin 50 mg BID . Continue current regimen at this time  Migraines: . Moderately well managed; current treatment: propranolol ER 60 mg daily, rizatriptan 10 mg PRN. Reports reduced migraine frequency and improvement in symptoms w/ triptan use.  Marland Kitchen Recommended to continue current regimen and collaboration w/ neurology   Morgan Goals/Self-Care Activities . Over the next 90 days, Morgan will:  - take medications as prescribed target a minimum of 150 minutes of moderate intensity exercise weekly engage in dietary modifications by continuing to monitor portion sizes  Follow Up Plan: Telephone follow up appointment with care management team member scheduled for:~ 6 weeks      Plan: Telephone follow up appointment with care management team member scheduled for: ~ 6 weeks  Catie [name], PharmD, [place], CPP Clinical Pharmacist   [place] [phone]

## 2020-03-18 ENCOUNTER — Ambulatory Visit: Payer: BC Managed Care – PPO | Admitting: Pharmacist

## 2020-03-18 DIAGNOSIS — Z713 Dietary counseling and surveillance: Secondary | ICD-10-CM

## 2020-03-18 DIAGNOSIS — E7800 Pure hypercholesterolemia, unspecified: Secondary | ICD-10-CM

## 2020-03-18 DIAGNOSIS — Z6830 Body mass index (BMI) 30.0-30.9, adult: Secondary | ICD-10-CM

## 2020-03-18 NOTE — Chronic Care Management (AMB) (Signed)
Care Management   Pharmacy Note  03/18/2020 Name: BIRD TAILOR MRN: [id number] DOB: 04/12/1975   Subjective:  Ariel Morgan is a 44 y.o. year old female who is a primary care patient of Ariel Pheasant, MD. The Care Management/Care Coordination team team was consulted for assistance with care management and care coordination needs.    Care coordination follow up in response to provider referral for pharmacy case management and/or care coordination services.   Consent to Services:  Ariel Morgan was given information about care management/care coordination services, agreed to services, and gave verbal consent prior to initiation of services. Please see initial visit note for detailed documentation.   Review of patient status, including review of consultants reports, laboratory and other test data, was performed as part of comprehensive evaluation and provision of chronic care management services.   SDOH (Social Determinants of Health) assessments and interventions performed: none    Objective:  Lab Results  Component Value Date   CREATININE 0.63 05/23/2019   CREATININE 0.74 12/08/2018   CREATININE 0.83 12/19/2017    No results found for: HGBA1C     Component Value Date/Time   CHOL 166 12/08/2018 1355   TRIG 76.0 12/08/2018 1355   HDL 62.00 12/08/2018 1355   CHOLHDL 3 12/08/2018 1355   VLDL 15.2 12/08/2018 1355   LDLCALC 88 12/08/2018 1355   LDLDIRECT 132.6 01/29/2013 0837    BP Readings from Last 3 Encounters:  12/13/19 122/87  10/30/19 120/80  09/19/19 120/80    Assessment:   No Known Allergies  Medications Reviewed Today    Reviewed by De Hollingshead, RPH-CPP (Pharmacist) on 03/12/20 at [place] List Status: <None>  Medication Order Taking? Sig Documenting Provider Last Dose Status Informant  Ascorbic Acid (VITAMIN C PO) [id number] Yes Take by mouth. [provider] Taking Active   calcium-vitamin D 250-100 MG-UNIT tablet [id number] Yes  Take 1 tablet by mouth 2 (two) times daily. [provider] Taking Active   escitalopram (LEXAPRO) 20 MG tablet [id number] Yes TAKE 1 TABLET BY MOUTH EVERY DAY Ariel Pheasant, MD Taking Active   fexofenadine (ALLEGRA) 60 MG tablet [id number] Yes Take 60 mg by mouth 2 (two) times daily.  [provider] Taking Active   Insulin Pen Needle (PEN NEEDLES) 32G X 4 MM MISC [id number] Yes Inject 1 pen into the skin daily. Use with [name], [name], MD Taking Active   Liraglutide -Weight Management (SAXENDA) 18 MG/3ML SOPN [id number] Yes Inject 1.8 mg daily. Titrate as tolerated. Max daily dose 3 mg Ariel Pheasant, MD Taking Active   pregabalin (LYRICA) 50 MG capsule [id number] Yes TAKE 1 CAPSULE (50 MG TOTAL) BY MOUTH 2 (TWO) TIMES DAILY. Ariel Pheasant, MD Taking Active   propranolol ER (INDERAL LA) 60 MG 24 hr capsule [id number] Yes Take 60 mg by mouth at bedtime. [provider] Taking Active   rizatriptan (MAXALT) 10 MG tablet [id number] Yes Take 10 mg by mouth daily as needed. [provider] Taking Active   valACYclovir (VALTREX) 1000 MG tablet [id number] Yes Take 1 tablet (1,000 mg total) by mouth 3 (three) times daily. Marval Regal, NP Taking Active   venlafaxine XR (EFFEXOR-XR) 75 MG 24 hr capsule [id number] Yes Take 75 mg by mouth daily. [provider] Taking Active   vitamin B-12 (CYANOCOBALAMIN) 1000 MCG tablet [id number] Yes Take 1,000 mcg by mouth daily.  [provider] Taking Active Self  zinc gluconate 50 MG tablet [id number] Yes Take 50 mg by mouth  daily.  [provider] Taking Active Self          Patient Active Problem List   Diagnosis Date Noted  . S/P laparoscopic hysterectomy 09/19/2019  . Fibroid 05/28/2019  . RLQ abdominal pain 05/28/2019  . Leg pain 02/25/2019  . Weight loss 11/19/2018  . Surgery, elective   . Hyperlipidemia   . Anxiety state   . Acute blood loss anemia   . Post-operative pain   . FUO  (fever of unknown origin)   . S/P lumbar fusion 12/28/2017  . Pre-op evaluation 12/11/2017  . Abnormal mammogram 07/23/2017  . Microcalcification of right breast on mammogram 05/04/2017  . Low back pain 06/17/2015  . Health care maintenance 03/15/2015  . Herpes zoster 09/16/2014  . GAD (generalized anxiety disorder) 12/03/2012  . GERD (gastroesophageal reflux disease) 12/03/2012  . Headache 12/03/2012  . Hypercholesterolemia 12/03/2012    Medication Assistance: None required. Patient affirms current coverage meets needs.   Patient Care Plan: Medication Management    Problem Identified: Obesity, Migraines, Depression     Long-Range Goal: Disease Progression Prevention   This Visit's Progress: On track  Recent Progress: On track  Priority: High  Note:   Current Barriers:  . Unable to maintain control of weight  Pharmacist Clinical Goal(s):  Marland Kitchen Over the next 90 days, patient will achieve adherence to monitoring guidelines and medication adherence to achieve therapeutic efficacy. through collaboration with PharmD and provider.  . Over the next 90 days, patient will achieve and maintain goal weight loss of 5-10% of baseline body weight  Interventions: . Inter-disciplinary care team collaboration (see longitudinal plan of care) . Comprehensive medication review performed; medication list updated in electronic medical record  Obesity: . Progressing with goal of weight loss; Current treatment: Saxenda 3 mg daily . Baseline weight prior to GLP1 therapy: 208 lbs. Most recent home weight: 168 lbs. 19% weight loss thus far on therapy  . PA for [facility] approved. Complete supply of Saxenda. Then, recommend starting Wegovy 1 mg once weekly x 4 weeks, then increase to 1.7 mg weekly x 4 weeks, then increase to max dose 2.4 mg weekly thereafter.  . Contacted [facility] to notify to fill Wegovy 1 mg weekly. They confirmed that patient picked this up yesterday.  . Contacted patient. LVM that I was  scheduling a f/u call in ~ 4 weeks to f/u on weight management and to call me if we needed to r/s  Depression: . Moderately well managed. Current regimen: escitalopram 20 mg daily, venlafaxine XR 75 mg daily. Reports the plan was to cross taper escitalopram to venlafaxine, but she notes that she immediately felt bad after reducing escitalopram so resumed 20 mg, continued venlafaxine. Notes the current plan is to continue both and re-address cross taper after the holidays . Counseled on risk of serotonin syndrome with multiple serotonergic agents. Encouraged continued follow up with psych.   Peripheral Neuropathy: . Controlled; Current treatment: pregabalin 50 mg BID . Continue current regimen at this time  Migraines: . Moderately well managed; current treatment: propranolol ER 60 mg daily, rizatriptan 10 mg PRN. Reports reduced migraine frequency and improvement in symptoms w/ triptan use.  Marland Kitchen Recommended to continue current regimen and collaboration w/ neurology   Patient Goals/Self-Care Activities . Over the next 90 days, patient will:  - take medications as prescribed target a minimum of 150 minutes of moderate intensity exercise weekly engage in dietary modifications by continuing to monitor portion sizes  Follow Up Plan: Telephone follow  up appointment with care management team member scheduled for:~ 4 weeks      Plan: Telephone follow up appointment with care management team member scheduled for: ~ 4 weeks  Catie [name], PharmD, [place], [place] Clinical Pharmacist [employer] at [employer] [phone]

## 2020-03-18 NOTE — Patient Instructions (Signed)
Ms. Gregg,   Complete the supply of Saxenda that you have. Then, start Wegovy at 1 mg weekly. This is a middle-range dose of this medication. Complete 1 mg weekly for 4 weeks, then increase to the next dose, 1.7 mg weekly. Complete 4 weeks of 1.7 mg weekly, then increase to the highest dose, 2.4 mg weekly.   I scheduled a follow up phone call in 4 weeks. Please call me or call the office if you need to reschedule that.   Take care!  Catie [name], PharmD [phone]  Visit Information  Patient Care Plan: Medication Management    Problem Identified: Obesity, Migraines, Depression     Long-Range Goal: Disease Progression Prevention   This Visit's Progress: On track  Recent Progress: On track  Priority: High  Note:   Current Barriers:  . Unable to maintain control of weight  Pharmacist Clinical Goal(s):  Marland Kitchen Over the next 90 days, patient will achieve adherence to monitoring guidelines and medication adherence to achieve therapeutic efficacy. through collaboration with PharmD and provider.  . Over the next 90 days, patient will achieve and maintain goal weight loss of 5-10% of baseline body weight  Interventions: . Inter-disciplinary care team collaboration (see longitudinal plan of care) . Comprehensive medication review performed; medication list updated in electronic medical record  Obesity: . Progressing with goal of weight loss; Current treatment: Saxenda 3 mg daily . Baseline weight prior to GLP1 therapy: 208 lbs. Most recent home weight: 168 lbs. 19% weight loss thus far on therapy  . PA for [facility] approved. Complete supply of Saxenda. Then, recommend starting Wegovy 1 mg once weekly x 4 weeks, then increase to 1.7 mg weekly x 4 weeks, then increase to max dose 2.4 mg weekly thereafter.  . [facility] to notify to fill Wegovy 1 mg weekly. They confirmed that patient picked this up yesterday.  . Contacted patient. LVM that I was scheduling a f/u call in ~ 4 weeks to f/u on  weight management and to call me if we needed to r/s  Depression: . Moderately well managed. Current regimen: escitalopram 20 mg daily, venlafaxine XR 75 mg daily. Reports the plan was to cross taper escitalopram to venlafaxine, but she notes that she immediately felt bad after reducing escitalopram so resumed 20 mg, continued venlafaxine. Notes the current plan is to continue both and re-address cross taper after the holidays . Counseled on risk of serotonin syndrome with multiple serotonergic agents. Encouraged continued follow up with psych.   Peripheral Neuropathy: . Controlled; Current treatment: pregabalin 50 mg BID . Continue current regimen at this time  Migraines: . Moderately well managed; current treatment: propranolol ER 60 mg daily, rizatriptan 10 mg PRN. Reports reduced migraine frequency and improvement in symptoms w/ triptan use.  Marland Kitchen Recommended to continue current regimen and collaboration w/ neurology   Patient Goals/Self-Care Activities . Over the next 90 days, patient will:  - take medications as prescribed target a minimum of 150 minutes of moderate intensity exercise weekly engage in dietary modifications by continuing to monitor portion sizes  Follow Up Plan: Telephone follow up appointment with care management team member scheduled for:~ 4 weeks      The patient verbalized understanding of instructions, educational materials, and care plan provided today and agreed to receive a mailed copy of patient instructions, educational materials, and care plan.    Plan: Telephone follow up appointment with care management team member scheduled for: ~ 4 weeks  Catie [name], PharmD, [place], CPP Clinical  Probation officer at [place]

## 2020-03-19 ENCOUNTER — Telehealth: Payer: Self-pay

## 2020-03-19 NOTE — Telephone Encounter (Signed)
PA was done and approved for wegovy from 03/15/20 to 10/13/2020.

## 2020-04-16 ENCOUNTER — Ambulatory Visit: Payer: Self-pay | Admitting: Pharmacist

## 2020-04-16 DIAGNOSIS — Z713 Dietary counseling and surveillance: Secondary | ICD-10-CM

## 2020-04-16 DIAGNOSIS — E7800 Pure hypercholesterolemia, unspecified: Secondary | ICD-10-CM

## 2020-04-16 DIAGNOSIS — Z6830 Body mass index (BMI) 30.0-30.9, adult: Secondary | ICD-10-CM

## 2020-04-16 MED ORDER — SEMAGLUTIDE-WEIGHT MANAGEMENT 1 MG/0.5ML ~~LOC~~ SOAJ: 1.0000 mg | SUBCUTANEOUS | 0 refills | Status: DC

## 2020-04-16 NOTE — Patient Instructions (Signed)
Visit Information  Patient Care Plan: Medication Management    Problem Identified: Obesity, Migraines, Depression     Long-Range Goal: Disease Progression Prevention   Recent Progress: On track  Priority: High  Note:   Current Barriers:  . Unable to maintain control of weight  Pharmacist Clinical Goal(s):  Marland Kitchen Over the next 90 days, patient will achieve adherence to monitoring guidelines and medication adherence to achieve therapeutic efficacy. through collaboration with PharmD and provider.  . Over the next 90 days, patient will achieve and maintain goal weight loss of 5-10% of baseline body weight  Interventions: . 1:1 collaboration with [name], MD regarding development and update of comprehensive plan of care as evidenced by provider attestation and co-signature . Inter-disciplinary care team collaboration (see longitudinal plan of care) . Comprehensive medication review performed; medication list updated in electronic medical record  Obesity: . Progressing with goal of weight loss; Current treatment: Wegovy 1 mg weekly x 4 weeks (previously on Saxenda; decided to switch due to plateau in weight and desire for reduced injection burden) . Patient calls today to report increased diarrhea since starting [facility]. Denies any other medication changes. Does not wish to discontinue therapy.  . Baseline weight prior to GLP1 therapy: 208 lbs. Most recent home weight: 168 lbs.  . Discussed that constipation is generally more common w/ this medication, but if there is a temporal relationship between starting [facility] and start of diarrhea, it could be connected. Decided to continue Wegovy 1 mg for 4 more weeks to allow for tolerability prior to increasing to 1.7 mg weekly. Script sent in to pharmacy. Patient verbalizes understanding  Depression: . Moderately well managed. Current regimen: escitalopram 20 mg daily, venlafaxine XR 75 mg daily. Reports the plan was to cross taper escitalopram to  venlafaxine, but she notes that she immediately felt bad after reducing escitalopram so resumed 20 mg, continued venlafaxine.  Geanie Logan if high serotonin burden (ie serotonin syndrome) is contributing to diarrhea. Encouraged patient to follow up with psychiatry. She isn't sure when her next follow up is, but will look into that and report her symptoms.   Peripheral Neuropathy: . Controlled; Current treatment: pregabalin 50 mg BID . Continue current regimen at this time  Migraines: . Moderately well managed; current treatment: propranolol ER 60 mg daily, rizatriptan 10 mg PRN. Reports reduced migraine frequency and improvement in symptoms w/ triptan use.  Marland Kitchen Recommended to continue current regimen and collaboration w/ neurology   Patient Goals/Self-Care Activities . Over the next 90 days, patient will:  - take medications as prescribed target a minimum of 150 minutes of moderate intensity exercise weekly engage in dietary modifications by continuing to monitor portion sizes  Follow Up Plan: Telephone follow up appointment with care management team member scheduled for:~ 4 weeks     The patient verbalized understanding of instructions, educational materials, and care plan provided today and declined offer to receive copy of patient instructions, educational materials, and care plan.   Plan: Telephone follow up appointment with care management team member scheduled for:  ~ 4 weeks  Catie [name], PharmD, [place], [place] Clinical Pharmacist [employer] at [employer] [phone]

## 2020-04-16 NOTE — Chronic Care Management (AMB) (Signed)
Care Management   Pharmacy Note  04/16/2020 Name: Ariel Morgan MRN: [id number] DOB: [birth year]  Subjective: Ariel Morgan is a 45 y.o. year old female who is a primary care patient of [name], MD. The Care Management team was consulted for assistance with care management and care coordination needs.    Engaged with patient by telephone for follow up visit in response to provider referral for pharmacy case management and/or care coordination services.   The patient was given information about Care Management services today including:  1. Care Management services includes personalized support from designated clinical staff supervised by the patient's primary care provider, including individualized plan of care and coordination with other care providers. 2. 24/7 contact phone numbers for assistance for urgent and routine care needs. 3. The patient may stop case management services at any time by phone call to the office staff.  Patient agreed to services and consent obtained.  Review of patient status, including review of consultants reports, laboratory and other test data, was performed as part of comprehensive evaluation and provision of chronic care management services.   SDOH (Social Determinants of Health) assessments and interventions performed:  none today  Objective:  Lab Results  Component Value Date   CREATININE 0.63 05/23/2019   CREATININE 0.74 12/08/2018   CREATININE 0.83 12/19/2017        Component Value Date/Time   CHOL 166 12/08/2018 1355   TRIG 76.0 12/08/2018 1355   HDL 62.00 12/08/2018 1355   CHOLHDL 3 12/08/2018 1355   VLDL 15.2 12/08/2018 1355   LDLCALC 88 12/08/2018 1355   LDLDIRECT 132.6 01/29/2013 0837     BP Readings from Last 3 Encounters:  12/13/19 122/87  10/30/19 120/80  09/19/19 120/80    Care Plan  No Known Allergies  Medications Reviewed Today    Reviewed by De Hollingshead, RPH-CPP (Pharmacist) on 04/16/20 at  1613  Med List Status: <None>  Medication Order Taking? Sig Documenting Provider Last Dose Status Informant  Ascorbic Acid (VITAMIN C PO) [id number] Yes Take by mouth. [provider] Taking Active   calcium-vitamin D 250-100 MG-UNIT tablet [id number] Yes Take 1 tablet by mouth 2 (two) times daily. [provider] Taking Active   escitalopram (LEXAPRO) 20 MG tablet [id number] Yes TAKE 1 TABLET BY MOUTH EVERY DAY [name], MD Taking Active   fexofenadine (ALLEGRA) 60 MG tablet [id number] Yes Take 60 mg by mouth 2 (two) times daily.  [provider] Taking Active   Insulin Pen Needle (PEN NEEDLES) 32G X 4 MM MISC [id number] Yes Inject 1 pen into the skin daily. Use with [name], [name], MD Taking Active   Liraglutide -Weight Management (SAXENDA) 18 MG/3ML SOPN [id number] No Inject 1.8 mg daily. Titrate as tolerated. Max daily dose 3 mg  Patient not taking: Reported on 04/16/2020   [name], MD Not Taking Active   pregabalin (LYRICA) 50 MG capsule [id number] Yes TAKE 1 CAPSULE (50 MG TOTAL) BY MOUTH 2 (TWO) TIMES DAILY. [name], MD Taking Active   propranolol ER (INDERAL LA) 60 MG 24 hr capsule [id number] Yes Take 60 mg by mouth at bedtime. [provider] Taking Active   rizatriptan (MAXALT) 10 MG tablet [id number] Yes Take 10 mg by mouth daily as needed. [provider] Taking Active   Semaglutide-Weight Management 1 MG/0.5ML Darden Palmer [id number]  Inject 1 mg into the skin once a week for 28 days. Then increase to next dose [name], MD  Active   Semaglutide-Weight  Management 1.7 MG/0.75ML SOAJ [id number] No Inject 1.7 mg into the skin once a week for 28 days. Then increase to next dsoe  Patient not taking: Reported on 04/16/2020   Dale [place], MD Not Taking Active   Semaglutide-Weight Management 2.4 MG/0.75ML SOAJ [id number] No Inject 2.4 mg into the skin once a week for 28 days.  Patient not taking: Reported on 04/16/2020   Dale [place], MD Not Taking Active   valACYclovir (VALTREX) 1000 MG tablet [id number] No Take 1 tablet (1,000 mg total) by mouth 3 (three) times daily.  Patient not taking: Reported on 04/16/2020   Theadore Nan, NP Not Taking Active   venlafaxine XR (EFFEXOR-XR) 75 MG 24 hr capsule [id number] Yes Take 75 mg by mouth daily. [provider] Taking Active   vitamin B-12 (CYANOCOBALAMIN) 1000 MCG tablet [id number] Yes Take 1,000 mcg by mouth daily.  [provider] Taking Active Self  zinc gluconate 50 MG tablet [id number] Yes Take 50 mg by mouth daily.  [provider] Taking Active Self          Patient Active Problem List   Diagnosis Date Noted  . S/P laparoscopic hysterectomy 09/19/2019  . Fibroid 05/28/2019  . RLQ abdominal pain 05/28/2019  . Leg pain 02/25/2019  . Weight loss 11/19/2018  . Surgery, elective   . Hyperlipidemia   . Anxiety state   . Acute blood loss anemia   . Post-operative pain   . FUO (fever of unknown origin)   . S/P lumbar fusion 12/28/2017  . Pre-op evaluation 12/11/2017  . Abnormal mammogram 07/23/2017  . Microcalcification of right breast on mammogram 05/04/2017  . Low back pain 06/17/2015  . Health care maintenance 03/15/2015  . Herpes zoster 09/16/2014  . GAD (generalized anxiety disorder) 12/03/2012  . GERD (gastroesophageal reflux disease) 12/03/2012  . Headache 12/03/2012  . Hypercholesterolemia 12/03/2012    Conditions to be addressed/monitored: Depression and migraine, weight  Care Plan : Medication Management  Updates made by Lourena Simmonds, RPH-CPP since 04/16/2020 12:00 AM    Problem: Obesity, Migraines, Depression     Long-Range Goal: Disease Progression Prevention   Recent Progress: On track  Priority: High  Note:   Current Barriers:  . Unable to maintain control of weight  Pharmacist Clinical Goal(s):  Marland Kitchen Over the next 90 days, patient will achieve adherence to monitoring guidelines and medication  adherence to achieve therapeutic efficacy. through collaboration with PharmD and provider.  . Over the next 90 days, patient will achieve and maintain goal weight loss of 5-10% of baseline body weight  Interventions: . 1:1 collaboration with Dale [place], MD regarding development and update of comprehensive plan of care as evidenced by provider attestation and co-signature . Inter-disciplinary care team collaboration (see longitudinal plan of care) . Comprehensive medication review performed; medication list updated in electronic medical record  Obesity: . Progressing with goal of weight loss; Current treatment: Wegovy 1 mg weekly x 4 weeks (previously on Saxenda; decided to switch due to plateau in weight and desire for reduced injection burden) . Patient calls today to report increased diarrhea since starting [facility]. Denies any other medication changes. Does not wish to discontinue therapy.  . Baseline weight prior to GLP1 therapy: 208 lbs. Most recent home weight: 168 lbs.  . Discussed that constipation is generally more common w/ this medication, but if there is a temporal relationship between starting [facility] and start of diarrhea, it could be connected. Decided to continue Wegovy 1 mg for 4  more weeks to allow for tolerability prior to increasing to 1.7 mg weekly. Script sent in to pharmacy. Patient verbalizes understanding  Depression: . Moderately well managed. Current regimen: escitalopram 20 mg daily, venlafaxine XR 75 mg daily. Reports the plan was to cross taper escitalopram to venlafaxine, but she notes that she immediately felt bad after reducing escitalopram so resumed 20 mg, continued venlafaxine.  Geanie Logan if high serotonin burden (ie serotonin syndrome) is contributing to diarrhea. Encouraged patient to follow up with psychiatry. She isn't sure when her next follow up is, but will look into that and report her symptoms.   Peripheral Neuropathy: . Controlled; Current treatment:  pregabalin 50 mg BID . Continue current regimen at this time  Migraines: . Moderately well managed; current treatment: propranolol ER 60 mg daily, rizatriptan 10 mg PRN. Reports reduced migraine frequency and improvement in symptoms w/ triptan use.  Marland Kitchen Recommended to continue current regimen and collaboration w/ neurology   Patient Goals/Self-Care Activities . Over the next 90 days, patient will:  - take medications as prescribed target a minimum of 150 minutes of moderate intensity exercise weekly engage in dietary modifications by continuing to monitor portion sizes  Follow Up Plan: Telephone follow up appointment with care management team member scheduled for:~ 4 weeks     Medication Assistance:  None required. Patient affirms current coverage meets needs.   Follow Up:  Patient agrees to Care Plan and Follow-up.  Plan: Telephone follow up appointment with care management team member scheduled for:  ~ 4 weeks  Catie [name], PharmD, [place], [place] Clinical Pharmacist [employer] at [employer] [phone]

## 2020-05-08 ENCOUNTER — Ambulatory Visit: Payer: Self-pay | Admitting: Pharmacist

## 2020-05-08 DIAGNOSIS — Z6830 Body mass index (BMI) 30.0-30.9, adult: Secondary | ICD-10-CM

## 2020-05-08 DIAGNOSIS — F411 Generalized anxiety disorder: Secondary | ICD-10-CM

## 2020-05-08 DIAGNOSIS — M79606 Pain in leg, unspecified: Secondary | ICD-10-CM

## 2020-05-08 DIAGNOSIS — R634 Abnormal weight loss: Secondary | ICD-10-CM

## 2020-05-08 DIAGNOSIS — E7800 Pure hypercholesterolemia, unspecified: Secondary | ICD-10-CM

## 2020-05-08 NOTE — Chronic Care Management (AMB) (Signed)
Chronic Care Management Pharmacy Note  05/08/2020 Name:  Ariel Morgan MRN:  [id number] DOB:  1976-02-16  Subjective: Ariel Morgan is an 45 y.o. year old female who is a primary patient of Dale Enderlin, MD.  The CCM team was consulted for assistance with disease management and care coordination needs.    Engaged with patient by telephone for follow up visit in response to provider referral for pharmacy case management and/or care coordination services.   Consent to Services:  The patient was given information about Chronic Care Management services, agreed to services, and gave verbal consent prior to initiation of services.  Please see initial visit note for detailed documentation.   Objective:  Lab Results  Component Value Date   CREATININE 0.63 05/23/2019   CREATININE 0.74 12/08/2018   CREATININE 0.83 12/19/2017    No results found for: HGBA1C     Component Value Date/Time   CHOL 166 12/08/2018 1355   TRIG 76.0 12/08/2018 1355   HDL 62.00 12/08/2018 1355   CHOLHDL 3 12/08/2018 1355   VLDL 15.2 12/08/2018 1355   LDLCALC 88 12/08/2018 1355   LDLDIRECT 132.6 01/29/2013 0837    Clinical ASCVD: No  The 10-year ASCVD risk score Denman George DC Jr., et al., 2013) is: 0.3%   Values used to calculate the score:     Age: 20 years     Sex: Female     Is Non-Hispanic African American: No     Diabetic: No     Tobacco smoker: No     Systolic Blood Pressure: 106 mmHg     Is BP treated: No     HDL Cholesterol: 62 mg/dL     Total Cholesterol: 166 mg/dL     BP Readings from Last 3 Encounters:  12/13/19 122/87  10/30/19 120/80  09/19/19 120/80    Assessment: Review of patient past medical history, allergies, medications, health status, including review of consultants reports, laboratory and other test data, was performed as part of comprehensive evaluation and provision of chronic care management services.   SDOH:  (Social Determinants of Health) assessments and  interventions performed:  SDOH Interventions   Flowsheet Row Most Recent Value  SDOH Interventions   Financial Strain Interventions Intervention Not Indicated      CCM Care Plan  No Known Allergies  Medications Reviewed Today    Reviewed by Lourena Simmonds, RPH-CPP (Pharmacist) on 04/16/20 at 1613  Med List Status: <None>  Medication Order Taking? Sig Documenting Provider Last Dose Status Informant  Ascorbic Acid (VITAMIN C PO) [id number] Yes Take by mouth. [provider] Taking Active   calcium-vitamin D 250-100 MG-UNIT tablet [id number] Yes Take 1 tablet by mouth 2 (two) times daily. [provider] Taking Active   escitalopram (LEXAPRO) 20 MG tablet [id number] Yes TAKE 1 TABLET BY MOUTH EVERY DAY Dale [place], MD Taking Active   fexofenadine (ALLEGRA) 60 MG tablet [id number] Yes Take 60 mg by mouth 2 (two) times daily.  [provider] Taking Active   Insulin Pen Needle (PEN NEEDLES) 32G X 4 MM MISC [id number] Yes Inject 1 pen into the skin daily. Use with [name], [name], MD Taking Active   Liraglutide -Weight Management (SAXENDA) 18 MG/3ML SOPN [id number] No Inject 1.8 mg daily. Titrate as tolerated. Max daily dose 3 mg  Patient not taking: Reported on 04/16/2020   Dale , MD Not Taking Active   pregabalin (LYRICA) 50 MG capsule [id number] Yes TAKE 1 CAPSULE (50 MG TOTAL) BY MOUTH  2 (TWO) TIMES DAILY. [name], MD Taking Active   propranolol ER (INDERAL LA) 60 MG 24 hr capsule [id number] Yes Take 60 mg by mouth at bedtime. [provider] Taking Active   rizatriptan (MAXALT) 10 MG tablet [id number] Yes Take 10 mg by mouth daily as needed. [provider] Taking Active   Semaglutide-Weight Management 1 MG/0.5ML Darden Palmer [id number]  Inject 1 mg into the skin once a week for 28 days. Then increase to next dose [name], MD  Active   Semaglutide-Weight Management 1.7 MG/0.75ML SOAJ [id number] No Inject 1.7 mg into the  skin once a week for 28 days. Then increase to next dsoe  Patient not taking: Reported on 04/16/2020   [name], MD Not Taking Active   Semaglutide-Weight Management 2.4 MG/0.75ML SOAJ [id number] No Inject 2.4 mg into the skin once a week for 28 days.  Patient not taking: Reported on 04/16/2020   [name], MD Not Taking Active   valACYclovir (VALTREX) 1000 MG tablet [id number] No Take 1 tablet (1,000 mg total) by mouth 3 (three) times daily.  Patient not taking: Reported on 04/16/2020   Marval Regal, NP Not Taking Active   venlafaxine XR (EFFEXOR-XR) 75 MG 24 hr capsule [id number] Yes Take 75 mg by mouth daily. [provider] Taking Active   vitamin B-12 (CYANOCOBALAMIN) 1000 MCG tablet [id number] Yes Take 1,000 mcg by mouth daily.  [provider] Taking Active Self  zinc gluconate 50 MG tablet [id number] Yes Take 50 mg by mouth daily.  [provider] Taking Active Self          Patient Active Problem List   Diagnosis Date Noted  . S/P laparoscopic hysterectomy 09/19/2019  . Fibroid 05/28/2019  . RLQ abdominal pain 05/28/2019  . Leg pain 02/25/2019  . Weight loss 11/19/2018  . Surgery, elective   . Hyperlipidemia   . Anxiety state   . Acute blood loss anemia   . Post-operative pain   . FUO (fever of unknown origin)   . S/P lumbar fusion 12/28/2017  . Pre-op evaluation 12/11/2017  . Abnormal mammogram 07/23/2017  . Microcalcification of right breast on mammogram 05/04/2017  . Low back pain 06/17/2015  . Health care maintenance 03/15/2015  . Herpes zoster 09/16/2014  . GAD (generalized anxiety disorder) 12/03/2012  . GERD (gastroesophageal reflux disease) 12/03/2012  . Headache 12/03/2012  . Hypercholesterolemia 12/03/2012    Conditions to be addressed/monitored: HTN, HLD, Depression and Obesity, migraines  Care Plan : Medication Management  Updates made by De Hollingshead, RPH-CPP since 05/08/2020 12:00 AM    Problem: Obesity,  Migraines, Depression     Long-Range Goal: Disease Progression Prevention   Recent Progress: On track  Priority: High  Note:   Current Barriers:  . Unable to maintain control of weight  Pharmacist Clinical Goal(s):  Marland Kitchen Over the next 90 days, patient will achieve adherence to monitoring guidelines and medication adherence to achieve therapeutic efficacy. through collaboration with PharmD and provider.  . Over the next 90 days, patient will achieve and maintain goal weight loss of 5-10% of baseline body weight  Interventions: . 1:1 collaboration with [name], MD regarding development and update of comprehensive plan of care as evidenced by provider attestation and co-signature . Inter-disciplinary care team collaboration (see longitudinal plan of care) . Comprehensive medication review performed; medication list updated in electronic medical record  Obesity: . Progressing with goal of weight loss; Current treatment: Wegovy 1 mg weekly -reports that she was  out for a week while [facility] was waiting on [facility] order. Did not have diarrhea that week. Restarted and had diarrhea. However, there has been no diarrhea today . Previously on Saxenda; decided to switch due to plateau in weight and desire for reduced injection burden . Baseline weight prior to GLP1 therapy: 208 lbs. Most recent home weight: 168 lbs.  . Patient requested to wait another week to see if tolerability is improving. She will send me a MyChart message in 1 week. We can continue Wegovy 1 mg weekly if diarrhea has improved, or we can reduce to 0.5 mg weekly and eventually titrate back up. Patient verbalizes understanding.  Depression: . Moderately well managed. Current regimen: escitalopram 20 mg daily, venlafaxine XR 75 mg daily. Reports the plan was to cross taper escitalopram to venlafaxine, but she notes that she immediately felt bad after reducing escitalopram so resumed 20 mg, continued venlafaxine.  . Encouraged patient  to follow up with psychiatry regarding SSRI taper  Peripheral Neuropathy: . Improved; Current treatment: pregabalin 50 mg BID, 150 mg QPM per neurology . Recommended to current regimen at this time  Migraines: . Moderately well managed; current treatment: propranolol ER 60 mg daily, rizatriptan 10 mg PRN, OTC magnesium for prevention per neurology.  . Recommended to continue current regimen and collaboration w/ neurology   Patient Goals/Self-Care Activities . Over the next 90 days, patient will:  - take medications as prescribed target a minimum of 150 minutes of moderate intensity exercise weekly engage in dietary modifications by continuing to monitor portion sizes  Follow Up Plan: Telephone follow up appointment with care management team member scheduled for:~ 4 weeks     Medication Assistance: None required.  Patient affirms current coverage meets needs.  Follow Up:  Patient agrees to Care Plan and Follow-up.  Plan: Telephone follow up appointment with care management team member scheduled for:  ~ 4 weeks  Catie [name], PharmD, [place], [place] Clinical Pharmacist [employer] [facility] [employer] [phone]

## 2020-05-08 NOTE — Patient Instructions (Signed)
Visit Information  Goals Addressed              This Visit's Progress     Patient Stated   .  Medication Management (pt-stated)        Patient Goals/Self-Care Activities . Over the next 90 days, patient will:  - take medications as prescribed target a minimum of 150 minutes of moderate intensity exercise weekly engage in dietary modifications by continuing to monitor portion sizes          Patient verbalizes understanding of instructions provided today and agrees to view in [place].   Plan: Telephone follow up appointment with care management team member scheduled for:  ~ 4 weeks  Catie [name], PharmD, [place], [place] Clinical Pharmacist [employer] [facility] [employer] [phone]

## 2020-05-30 ENCOUNTER — Telehealth: Payer: Self-pay | Admitting: *Deleted

## 2020-05-30 NOTE — Chronic Care Management (AMB) (Signed)
  Care Management   Note  05/30/2020 Name: [name] MRN: [id number] DOB: March 19, 1976  Ariel Morgan is a 45 y.o. year old female who is a primary care patient of [name], MD and is actively engaged with the care management team. I reached out to [name] by phone today to assist with re-scheduling a follow up visit with the Pharmacist  Follow up plan: Unsuccessful telephone outreach attempt made. A HIPAA compliant phone message was left for the patient providing contact information and requesting a return call.  The care management team will reach out to the patient again over the next 4 days.  If patient returns call to provider office, please advise to call Gowrie Lysle Morales at [facility]

## 2020-06-01 ENCOUNTER — Other Ambulatory Visit: Payer: Self-pay | Admitting: Internal Medicine

## 2020-06-01 DIAGNOSIS — Z713 Dietary counseling and surveillance: Secondary | ICD-10-CM

## 2020-06-02 NOTE — Telephone Encounter (Signed)
Per Catie's last note, she was off wegovy temporarily to see if GI symptoms subsided.  Was questioning if needed to decrease dose.  See Catie's note.  Need to clarify how pt is doing - so know if needs adjusted dose, etc.

## 2020-06-03 NOTE — Telephone Encounter (Signed)
Patient confirmed that she did the trial dose of the 1 mg again and does not want to reduce the dose to 0.5mg . She is requesting a 90 day supply. Per your last note it looked like it was fine for her to stay on the 1 mg if tolerated. Just wanted to confirm with you. Still needs script sent in.

## 2020-06-05 NOTE — Chronic Care Management (AMB) (Signed)
  Care Management   Note  06/05/2020 Name: Ariel Morgan MRN: [id number] DOB: [birth year]  Ariel Morgan is a 45 y.o. year old female who is a primary care patient of [name], MD and is actively engaged with the care management team. I reached out to [name] by phone today to assist with re-scheduling a follow up visit with the Pharmacist  Follow up plan: Telephone appointment with care management team member scheduled for: 06/24/2020  [facility]

## 2020-06-11 ENCOUNTER — Telehealth: Payer: Self-pay

## 2020-06-12 ENCOUNTER — Encounter: Payer: Self-pay | Admitting: Internal Medicine

## 2020-06-13 NOTE — Telephone Encounter (Signed)
Pt scheduled  

## 2020-06-13 NOTE — Telephone Encounter (Signed)
Please call her and notify her that there are a number of issues that can contribute to hair loss.  Before stopping her medication, can schedule appt or at least a lab appt to rule out metabolic issues that could be contributing.  (fasting lab)

## 2020-06-18 ENCOUNTER — Other Ambulatory Visit: Payer: Self-pay

## 2020-06-18 ENCOUNTER — Encounter: Payer: Self-pay | Admitting: Internal Medicine

## 2020-06-18 ENCOUNTER — Telehealth (INDEPENDENT_AMBULATORY_CARE_PROVIDER_SITE_OTHER): Payer: Self-pay | Admitting: Internal Medicine

## 2020-06-18 VITALS — Ht 62.0 in | Wt 168.0 lb

## 2020-06-18 DIAGNOSIS — Z13220 Encounter for screening for lipoid disorders: Secondary | ICD-10-CM

## 2020-06-18 DIAGNOSIS — R059 Cough, unspecified: Secondary | ICD-10-CM

## 2020-06-18 DIAGNOSIS — L659 Nonscarring hair loss, unspecified: Secondary | ICD-10-CM

## 2020-06-18 MED ORDER — AZITHROMYCIN 250 MG PO TABS
ORAL_TABLET | ORAL | 0 refills | Status: DC
Start: 2020-06-18 — End: 2020-06-24

## 2020-06-18 MED ORDER — CHERATUSSIN AC 100-10 MG/5ML PO SOLN: ORAL | 0 refills | Status: DC

## 2020-06-18 NOTE — Progress Notes (Signed)
Patient ID: Ariel Morgan, female   DOB: 28-Sep-1975, 45 y.o.   MRN: [id number]   Virtual Visit via video Note  This visit type was conducted due to national recommendations for restrictions regarding the COVID-19 pandemic (e.g. social distancing).  This format is felt to be most appropriate for this patient at this time.  All issues noted in this document were discussed and addressed.  No physical exam was performed (except for noted visual exam findings with Video Visits).   I connected with Thresa Ross by a video enabled telemedicine application and verified that I am speaking with the correct person using two identifiers. Location patient: home Location provider: work  Persons participating in the virtual visit: patient, provider  The limitations, risks, security and privacy concerns of performing an evaluation and management service by video and the availability of in person appointments have been discussed.  It has also been discussed with the patient that there may be a patient responsible charge related to this service. The patient expressed understanding and agreed to proceed.   Reason for visit: work in appt  HPI: Work in for hair loss.  She has also been sick. When she went to get her hair cut and colored, her hair stylist noticed that her hair had thinned out and was dry.  States when wet and ran her hand through her hair, an increased amount of hair came out.  Discussed.  She was concerned regarding propranolol contributing to the loss.  Discussed stress and other metabolic etiologies.  She also reports increased congestion and cough.  Cough more this week.  States symptoms started approximately 10 days ago.  Was outside. Noticed increased sinus pressure.  Initial hoarseness.  Increased cough.  No significant sinus pressure.  Minimal pressure in her head.  Some sore throat with coughing.  Drainage is better.  No nausea or vomiting.  No body aches.  No chest pain or chest tightness.   No sob.  Has taken otc sinus/headache medication and robitussin.  Discussed covid testing.     ROS: See pertinent positives and negatives per HPI.  Past Medical History:  Diagnosis Date  . Anemia   . Anxiety   . Arthritis   . Frequent headaches    H/O  . GERD (gastroesophageal reflux disease)   . Hypercholesterolemia   . Placenta previa     Past Surgical History:  Procedure Laterality Date  . ABDOMINAL EXPOSURE N/A 01/01/2018   Procedure: ABDOMINAL EXPOSURE;  Surgeon: Marty Heck, MD;  Location: [facility];  Service: Vascular;  Laterality: N/A;  . ANTERIOR LUMBAR FUSION N/A 01/01/2018   Procedure: ANTERIOR LUMBAR FUSION L5-S1; REPLACEMENT OF posterior HARDWARE L4-S1;  Surgeon: Melina Schools, MD;  Location: [facility];  Service: Orthopedics;  Laterality: N/A;  . BREAST BIOPSY Right 05/10/2017   right breast stereotatic bx path pending  . CESAREAN SECTION  2003 & 2010  . CHOLECYSTECTOMY  2005  . CYSTOSCOPY  09/11/2019   Procedure: CYSTOSCOPY;  Surgeon: Gae Dry, MD;  Location: [facility];  Service: Gynecology;;  . LUMBAR LAMINECTOMY/DECOMPRESSION MICRODISCECTOMY N/A 12/28/2017   Procedure: L4-S1 decompression and fusion;  Surgeon: Melina Schools, MD;  Location: [facility];  Service: Orthopedics;  Laterality: N/A;  4.5 hrs  . TOTAL LAPAROSCOPIC HYSTERECTOMY WITH SALPINGECTOMY Bilateral 09/11/2019   Procedure: TOTAL LAPAROSCOPIC HYSTERECTOMY WITH SALPINGECTOMY;  Surgeon: Gae Dry, MD;  Location: [facility];  Service: Gynecology;  Laterality: Bilateral;    Family History  Problem Relation Age of Onset  .  Hyperlipidemia Father   . Diabetes Sister   . Diabetes Maternal Grandmother   . Cancer Maternal Grandmother        vaginal  . Cirrhosis Maternal Grandmother   . Colon polyps Maternal Grandmother   . Breast cancer Maternal Grandmother        Early 57's    SOCIAL HX: reviewed.    Current Outpatient Medications:  .  amphetamine-dextroamphetamine (ADDERALL XR) 30 MG 24 hr  capsule, Take 30 mg by mouth daily., Disp: , Rfl:  .  Ascorbic Acid (VITAMIN C PO), Take by mouth., Disp: , Rfl:  .  azithromycin (ZITHROMAX) 250 MG tablet, Take two tablets x 1 day and then one tablet per day for four more days., Disp: 6 tablet, Rfl: 0 .  calcium-vitamin D 250-100 MG-UNIT tablet, Take 1 tablet by mouth 2 (two) times daily., Disp: , Rfl:  .  escitalopram (LEXAPRO) 20 MG tablet, TAKE 1 TABLET BY MOUTH EVERY DAY, Disp: 90 tablet, Rfl: 0 .  fexofenadine (ALLEGRA) 60 MG tablet, Take 60 mg by mouth 2 (two) times daily. , Disp: , Rfl:  .  guaiFENesin-codeine (CHERATUSSIN AC) 100-10 MG/5ML syrup, Take 1/2 - 1 teaspoon q hs prn cough, Disp: 60 mL, Rfl: 0 .  Insulin Pen Needle (PEN NEEDLES) 32G X 4 MM MISC, Inject 1 pen into the skin daily. Use with Saxenda, Disp: 100 each, Rfl: 3 .  pregabalin (LYRICA) 50 MG capsule, TAKE 1 CAPSULE (50 MG TOTAL) BY MOUTH 2 (TWO) TIMES DAILY., Disp: 60 capsule, Rfl: 1 .  propranolol ER (INDERAL LA) 60 MG 24 hr capsule, Take 60 mg by mouth at bedtime., Disp: , Rfl:  .  rizatriptan (MAXALT) 10 MG tablet, Take 10 mg by mouth daily as needed., Disp: , Rfl:  .  venlafaxine XR (EFFEXOR-XR) 75 MG 24 hr capsule, Take 75 mg by mouth daily., Disp: , Rfl:  .  vitamin B-12 (CYANOCOBALAMIN) 1000 MCG tablet, Take 1,000 mcg by mouth daily. , Disp: , Rfl:  .  WEGOVY 1 MG/0.5ML SOAJ, INJECT 1 MG INTO THE SKIN ONCE A WEEK FOR 28 DAYS., Disp: 2 mL, Rfl: 2 .  zinc gluconate 50 MG tablet, Take 50 mg by mouth daily. , Disp: , Rfl:  .  [START ON 07/08/2020] Semaglutide-Weight Management 2.4 MG/0.75ML SOAJ, Inject 2.4 mg into the skin once a week for 28 days. (Patient not taking: No sig reported), Disp: 3 mL, Rfl: 1 .  valACYclovir (VALTREX) 1000 MG tablet, Take 1 tablet (1,000 mg total) by mouth 3 (three) times daily. (Patient not taking: No sig reported), Disp: 21 tablet, Rfl: 0  EXAM:  GENERAL: alert, oriented, appears well and in no acute distress  HEENT: atraumatic,  conjunttiva clear, no obvious abnormalities on inspection of external nose and ears  NECK: normal movements of the head and neck  LUNGS: on inspection no signs of respiratory distress, breathing rate appears normal, no obvious gross SOB, gasping or wheezing  CV: no obvious cyanosis  PSYCH/NEURO: pleasant and cooperative, no obvious depression or anxiety, speech and thought processing grossly intact  ASSESSMENT AND PLAN:  Discussed the following assessment and plan:  Problem List Items Addressed This Visit    Cough - Primary    Congestion and cough as outlined.  flonase nasal spray and saline nasal spray as directed.  Robitussin DM/mucinex as directed.  Discussed covid.  Obtain nasal swab. Concern regarding URI. Treat with zpak as directed.  She feels she needs something to help stop coughing to  get some sleep.  cheratussin as directed.  Will take q hs. Do not take and drive.  She has cut down lyrica and will not take together.  Follow.  Call with update.        Relevant Orders   Novel Coronavirus, NAA ([facility]) (Completed)   Hair loss    Hair loss as outlined.  Discussed possible etiologies.  Check labs, including cbc, met c and tsh.  Hold on stopping propranolol.  Follow.            I discussed the assessment and treatment plan with the patient. The patient was provided an opportunity to ask questions and all were answered. The patient agreed with the plan and demonstrated an understanding of the instructions.   The patient was advised to call back or seek an in-person evaluation if the symptoms worsen or if the condition fails to improve as anticipated.   Charlene Scott, MD  

## 2020-06-19 LAB — SARS-COV-2, NAA 2 DAY TAT

## 2020-06-19 LAB — NOVEL CORONAVIRUS, NAA: SARS-CoV-2, NAA: NOT DETECTED

## 2020-06-22 ENCOUNTER — Telehealth: Payer: Self-pay | Admitting: Internal Medicine

## 2020-06-22 ENCOUNTER — Encounter: Payer: Self-pay | Admitting: Internal Medicine

## 2020-06-22 DIAGNOSIS — R059 Cough, unspecified: Secondary | ICD-10-CM | POA: Insufficient documentation

## 2020-06-22 DIAGNOSIS — L659 Nonscarring hair loss, unspecified: Secondary | ICD-10-CM | POA: Insufficient documentation

## 2020-06-22 NOTE — Assessment & Plan Note (Signed)
Hair loss as outlined.  Discussed possible etiologies.  Check labs, including cbc, met c and tsh.  Hold on stopping propranolol.  Follow.

## 2020-06-22 NOTE — Addendum Note (Signed)
Addended by: Alisa Graff on: 06/22/2020 12:35 PM   Modules accepted: Orders

## 2020-06-22 NOTE — Telephone Encounter (Signed)
Was just worked in for hair loss.  Was also having cough and congestion.  Tested for covid - negative.  Treated for URI.  She needs fasting lab appt scheduled when able to come in for labs (from being tested).  Thanks.

## 2020-06-22 NOTE — Assessment & Plan Note (Signed)
Congestion and cough as outlined.  flonase nasal spray and saline nasal spray as directed.  Robitussin DM/mucinex as directed.  Discussed covid.  Obtain nasal swab. Concern regarding URI. Treat with zpak as directed.  She feels she needs something to help stop coughing to get some sleep.  cheratussin as directed.  Will take q hs. Do not take and drive.  She has cut down lyrica and will not take together.  Follow.  Call with update.

## 2020-06-23 NOTE — Telephone Encounter (Signed)
Can you schedule her for fasting labs in a couple of weeks. She was just tested last week

## 2020-06-23 NOTE — Telephone Encounter (Signed)
scheduled

## 2020-06-24 ENCOUNTER — Ambulatory Visit: Payer: Self-pay | Admitting: Pharmacist

## 2020-06-24 DIAGNOSIS — F411 Generalized anxiety disorder: Secondary | ICD-10-CM

## 2020-06-24 DIAGNOSIS — Z6830 Body mass index (BMI) 30.0-30.9, adult: Secondary | ICD-10-CM

## 2020-06-24 DIAGNOSIS — R519 Headache, unspecified: Secondary | ICD-10-CM

## 2020-06-24 DIAGNOSIS — M79606 Pain in leg, unspecified: Secondary | ICD-10-CM

## 2020-06-24 MED ORDER — WEGOVY 1.7 MG/0.75ML ~~LOC~~ SOAJ: 1.7000 mg | SUBCUTANEOUS | 2 refills | Status: DC

## 2020-06-24 NOTE — Patient Instructions (Signed)
Visit Information  Goals Addressed              This Visit's Progress     Patient Stated   .  Medication Management (pt-stated)        Patient Goals/Self-Care Activities . Over the next 90 days, patient will:  - take medications as prescribed target a minimum of 150 minutes of moderate intensity exercise weekly engage in dietary modifications by continuing to monitor portion sizes           Patient verbalizes understanding of instructions provided today and agrees to view in [place].   Plan: Telephone follow up appointment with care management team member scheduled for:  ~ 8 weeks  Catie [name], PharmD, [place], [place] Clinical Pharmacist [employer] [facility] [employer] [phone]

## 2020-06-24 NOTE — Chronic Care Management (AMB) (Signed)
Care Management   Pharmacy Note  06/24/2020 Name: Ariel Morgan MRN: [id number] DOB: 04-26-1975  Subjective: Ariel Morgan is a 45 y.o. year old female who is a primary care patient of [name], MD. The Care Management team was consulted for assistance with care management and care coordination needs.    Engaged with patient by telephone for follow up visit in response to provider referral for pharmacy case management and/or care coordination services.   The patient was given information about Care Management services today including:  1. Care Management services includes personalized support from designated clinical staff supervised by the patient's primary care provider, including individualized plan of care and coordination with other care providers. 2. 24/7 contact phone numbers for assistance for urgent and routine care needs. 3. The patient may stop case management services at any time by phone call to the office staff.  Patient agreed to services and consent obtained.  Assessment:  Review of patient status, including review of consultants reports, laboratory and other test data, was performed as part of comprehensive evaluation and provision of chronic care management services.   SDOH (Social Determinants of Health) assessments and interventions performed:    Objective:  Lab Results  Component Value Date   CREATININE 0.63 05/23/2019   CREATININE 0.74 12/08/2018   CREATININE 0.83 12/19/2017       Component Value Date/Time   CHOL 166 12/08/2018 1355   TRIG 76.0 12/08/2018 1355   HDL 62.00 12/08/2018 1355   CHOLHDL 3 12/08/2018 1355   VLDL 15.2 12/08/2018 1355   LDLCALC 88 12/08/2018 1355   LDLDIRECT 132.6 01/29/2013 0837   Lab Results  Component Value Date   TSH 1.07 12/08/2018    Clinical ASCVD: No  The 10-year ASCVD risk score Mikey Bussing DC Jr., et al., 2013) is: 0.3%   Values used to calculate the score:     Age: 41 years     Sex: Female     Is  Non-Hispanic African American: No     Diabetic: No     Tobacco smoker: No     Systolic Blood Pressure: 932 mmHg     Is BP treated: No     HDL Cholesterol: 62 mg/dL     Total Cholesterol: 166 mg/dL     BP Readings from Last 3 Encounters:  12/13/19 122/87  10/30/19 120/80  09/19/19 120/80    Care Plan  No Known Allergies  Medications Reviewed Today    Reviewed by De Hollingshead, RPH-CPP (Pharmacist) on 06/24/20 at 1531  Med List Status: <None>  Medication Order Taking? Sig Documenting Provider Last Dose Status Informant  amphetamine-dextroamphetamine (ADDERALL XR) 30 MG 24 hr capsule [id number] Yes Take 30 mg by mouth daily. [provider] Taking Active   escitalopram (LEXAPRO) 20 MG tablet [id number] Yes TAKE 1 TABLET BY MOUTH EVERY DAY [name], MD Taking Active   fexofenadine (ALLEGRA) 60 MG tablet [id number] Yes Take 60 mg by mouth 2 (two) times daily.  [provider] Taking Active   Insulin Pen Needle (PEN NEEDLES) 32G X 4 MM MISC [id number] Yes Inject 1 pen into the skin daily. Use with [name], [name], MD Taking Active   pregabalin (LYRICA) 50 MG capsule [id number] Yes TAKE 1 CAPSULE (50 MG TOTAL) BY MOUTH 2 (TWO) TIMES DAILY. [name], MD Taking Active   propranolol ER (INDERAL LA) 60 MG 24 hr capsule [id number] No Take 60 mg by mouth at bedtime.  Patient not taking: Reported on 06/24/2020  [provider] Not Taking Active   rizatriptan (MAXALT) 10 MG tablet [id number] Yes Take 10 mg by mouth daily as needed. [provider] Taking Active   Semaglutide-Weight Management 2.4 MG/0.75ML SOAJ [id number] No Inject 2.4 mg into the skin once a week for 28 days.  Patient not taking: No sig reported   [name], MD Not Taking Active   valACYclovir (VALTREX) 1000 MG tablet [id number] No Take 1 tablet (1,000 mg total) by mouth 3 (three) times daily.  Patient not taking: No sig reported   Marval Regal, NP Not Taking  Active   venlafaxine XR (EFFEXOR-XR) 75 MG 24 hr capsule [id number] Yes Take 75 mg by mouth daily. [provider] Taking Active   WEGOVY 1 MG/0.5ML Darden Palmer [id number] Yes INJECT 1 MG INTO THE SKIN ONCE A WEEK FOR 28 DAYS. [name], MD Taking Active   zinc gluconate 50 MG tablet [id number] Yes Take 50 mg by mouth daily.  [provider] Taking Active Self          Patient Active Problem List   Diagnosis Date Noted  . Cough 06/22/2020  . Hair loss 06/22/2020  . S/P laparoscopic hysterectomy 09/19/2019  . Fibroid 05/28/2019  . RLQ abdominal pain 05/28/2019  . Leg pain 02/25/2019  . Weight loss 11/19/2018  . Surgery, elective   . Hyperlipidemia   . Anxiety state   . Acute blood loss anemia   . Post-operative pain   . FUO (fever of unknown origin)   . S/P lumbar fusion 12/28/2017  . Pre-op evaluation 12/11/2017  . Abnormal mammogram 07/23/2017  . Microcalcification of right breast on mammogram 05/04/2017  . Low back pain 06/17/2015  . Health care maintenance 03/15/2015  . Herpes zoster 09/16/2014  . GAD (generalized anxiety disorder) 12/03/2012  . GERD (gastroesophageal reflux disease) 12/03/2012  . Headache 12/03/2012  . Hypercholesterolemia 12/03/2012    Conditions to be addressed/monitored: obesity, migraines, depression  Care Plan : Medication Management  Updates made by De Hollingshead, RPH-CPP since 06/24/2020 12:00 AM    Problem: Obesity, Migraines, Depression     Long-Range Goal: Disease Progression Prevention   This Visit's Progress: On track  Recent Progress: On track  Priority: High  Note:   Current Barriers:  . Unable to maintain control of weight  Pharmacist Clinical Goal(s):  Marland Kitchen Over the next 90 days, patient will achieve adherence to monitoring guidelines and medication adherence to achieve therapeutic efficacy. through collaboration with PharmD and provider.  . Over the next 90 days, patient will achieve and maintain goal weight  loss of 5-10% of baseline body weight  Interventions: . 1:1 collaboration with [name], MD regarding development and update of comprehensive plan of care as evidenced by provider attestation and co-signature . Inter-disciplinary care team collaboration (see longitudinal plan of care) . Comprehensive medication review performed; medication list updated in electronic medical record  Obesity: . Progressing with goal of weight loss; Current treatment: Wegovy 1 mg weekly  . Denies any continued issues with diarrhea or GI upset in the past few weeks. . Baseline weight prior to GLP1 therapy: 208 lbs. Most recent home weight: 168 lbs.  . Increase Wegovy to 1.7 mg weekly. After 4 weeks, increase to 2.4 mg weekly as tolerated. Patient verbalizes understanding  Depression: . Moderately well managed. Current regimen: escitalopram 20 mg daily, venlafaxine XR 75 mg daily. Reports the plan was to cross taper escitalopram to venlafaxine, but she notes that she immediately felt bad after reducing  escitalopram so resumed 20 mg, continued venlafaxine. She notes that Dr. Toy Care is aware of this regimen and was fine with continuation . Continue current regimen at this time along with psychiatry collaboration  Peripheral Neuropathy: . Improved; Current treatment: pregabalin 50 mg BID . Recommended to current regimen at this time  Migraines: . Moderately well managed; current treatment: propranolol ER 60 mg daily, rizatriptan 10 mg PRN, OTC magnesium for prevention per neurology.  . Stopped propranolol as she was having concerns with hair loss, and she read that it could be related to propranolol. Was advised not to abruptly stop this medication, but she did. Denies issue with reflex tachycardia. Denies any change yet in hair loss or migraine frequency . Advised to contact neurology for sooner follow up if increased frequency of migraines w/ propranolol discontinuation . Lab work to evaluate for hair loss  scheduled in ~ 2 weeks  Patient Goals/Self-Care Activities . Over the next 90 days, patient will:  - take medications as prescribed target a minimum of 150 minutes of moderate intensity exercise weekly engage in dietary modifications by continuing to monitor portion sizes  Follow Up Plan: Telephone follow up appointment with care management team member scheduled for: ~6 weeks     Medication Assistance:  None required.  Patient affirms current coverage meets needs.  Follow Up:  Patient agrees to Care Plan and Follow-up.  Plan: Telephone follow up appointment with care management team member scheduled for:  ~ 8 weeks  Catie [name], PharmD, [place], [place] Clinical Pharmacist [employer] [facility] [employer] [phone]

## 2020-07-11 ENCOUNTER — Other Ambulatory Visit: Payer: Self-pay

## 2020-07-11 ENCOUNTER — Other Ambulatory Visit (INDEPENDENT_AMBULATORY_CARE_PROVIDER_SITE_OTHER): Payer: BC Managed Care – PPO

## 2020-07-11 DIAGNOSIS — Z13220 Encounter for screening for lipoid disorders: Secondary | ICD-10-CM

## 2020-07-11 DIAGNOSIS — L659 Nonscarring hair loss, unspecified: Secondary | ICD-10-CM | POA: Diagnosis not present

## 2020-07-11 DIAGNOSIS — E7800 Pure hypercholesterolemia, unspecified: Secondary | ICD-10-CM

## 2020-07-11 LAB — CBC WITH DIFFERENTIAL/PLATELET
Basophils Absolute: 0.2 10*3/uL — ABNORMAL HIGH (ref 0.0–0.1)
Basophils Relative: 2 % (ref 0.0–3.0)
Eosinophils Absolute: 0.3 10*3/uL (ref 0.0–0.7)
Eosinophils Relative: 3.5 % (ref 0.0–5.0)
HCT: 41.5 % (ref 36.0–46.0)
Hemoglobin: 14 g/dL (ref 12.0–15.0)
Lymphocytes Relative: 30.5 % (ref 12.0–46.0)
Lymphs Abs: 2.5 10*3/uL (ref 0.7–4.0)
MCHC: 33.7 g/dL (ref 30.0–36.0)
MCV: 86.6 fl (ref 78.0–100.0)
Monocytes Absolute: 0.5 10*3/uL (ref 0.1–1.0)
Monocytes Relative: 6 % (ref 3.0–12.0)
Neutro Abs: 4.7 10*3/uL (ref 1.4–7.7)
Neutrophils Relative %: 58 % (ref 43.0–77.0)
Platelets: 363 10*3/uL (ref 150.0–400.0)
RBC: 4.79 Mil/uL (ref 3.87–5.11)
RDW: 12.6 % (ref 11.5–15.5)
WBC: 8.1 10*3/uL (ref 4.0–10.5)

## 2020-07-11 LAB — COMPREHENSIVE METABOLIC PANEL
ALT: 17 U/L (ref 0–35)
AST: 18 U/L (ref 0–37)
Albumin: 4.4 g/dL (ref 3.5–5.2)
Alkaline Phosphatase: 83 U/L (ref 39–117)
BUN: 11 mg/dL (ref 6–23)
CO2: 27 mEq/L (ref 19–32)
Calcium: 9.4 mg/dL (ref 8.4–10.5)
Chloride: 105 mEq/L (ref 96–112)
Creatinine, Ser: 0.82 mg/dL (ref 0.40–1.20)
GFR: 86.96 mL/min (ref >60.00)
Glucose, Bld: 82 mg/dL (ref 70–99)
Potassium: 4.5 mEq/L (ref 3.5–5.1)
Sodium: 138 mEq/L (ref 135–145)
Total Bilirubin: 0.6 mg/dL (ref 0.2–1.2)
Total Protein: 6.9 g/dL (ref 6.0–8.3)

## 2020-07-11 LAB — TSH: TSH: 1.11 u[IU]/mL (ref 0.35–4.50)

## 2020-07-11 LAB — LIPID PANEL
Cholesterol: 210 mg/dL — ABNORMAL HIGH (ref 0–200)
HDL: 66 mg/dL (ref >39.00)
LDL Cholesterol: 129 mg/dL — ABNORMAL HIGH (ref 0–99)
NonHDL: 144.24
Total CHOL/HDL Ratio: 3
Triglycerides: 76 mg/dL (ref 0.0–149.0)
VLDL: 15.2 mg/dL (ref 0.0–40.0)

## 2020-07-11 NOTE — Addendum Note (Signed)
Addended by: Leeanne Rio on: 07/11/2020 09:02 AM   Modules accepted: Orders

## 2020-07-15 ENCOUNTER — Ambulatory Visit: Payer: BC Managed Care – PPO | Admitting: Internal Medicine

## 2020-07-15 ENCOUNTER — Other Ambulatory Visit: Payer: Self-pay

## 2020-07-15 DIAGNOSIS — E7800 Pure hypercholesterolemia, unspecified: Secondary | ICD-10-CM

## 2020-07-15 DIAGNOSIS — F411 Generalized anxiety disorder: Secondary | ICD-10-CM

## 2020-07-15 DIAGNOSIS — R634 Abnormal weight loss: Secondary | ICD-10-CM | POA: Diagnosis not present

## 2020-07-15 DIAGNOSIS — R519 Headache, unspecified: Secondary | ICD-10-CM

## 2020-07-15 DIAGNOSIS — L659 Nonscarring hair loss, unspecified: Secondary | ICD-10-CM

## 2020-07-15 DIAGNOSIS — M5441 Lumbago with sciatica, right side: Secondary | ICD-10-CM

## 2020-07-15 DIAGNOSIS — G8929 Other chronic pain: Secondary | ICD-10-CM

## 2020-07-15 NOTE — Progress Notes (Signed)
Patient ID: Ariel Morgan, female   DOB: Jan 16, 1976, 45 y.o.   MRN: [id number]   Subjective:    Patient ID: Ariel Morgan, female    DOB: 1975/10/05, 45 y.o.   MRN: [id number]  HPI This visit occurred during the SARS-CoV-2 public health emergency.  Safety protocols were in place, including screening questions prior to the visit, additional usage of staff PPE, and extensive cleaning of exam room while observing appropriate contact time as indicated for disinfecting solutions.  Patient here for a scheduled follow up. On wegovy.  Has lost weight.  Down to 168 pounds (started 208 pounds).  Watching her diet.  Tries to stay active.  No chest pain or sob reported.  No abdominal pain reported.  Hair loss after covid.  Reports is better.  Persistent back issues/neuropathy.  PT helped some. Seeing psychiatry.  Stable on current regimen.  Appears to overall be doing better.      Past Medical History:  Diagnosis Date  . Anemia   . Anxiety   . Arthritis   . Frequent headaches    H/O  . GERD (gastroesophageal reflux disease)   . Hypercholesterolemia   . Placenta previa    Past Surgical History:  Procedure Laterality Date  . ABDOMINAL EXPOSURE N/A 01/01/2018   Procedure: ABDOMINAL EXPOSURE;  Surgeon: Marty Heck, MD;  Location: [facility];  Service: Vascular;  Laterality: N/A;  . ANTERIOR LUMBAR FUSION N/A 01/01/2018   Procedure: ANTERIOR LUMBAR FUSION L5-S1; REPLACEMENT OF posterior HARDWARE L4-S1;  Surgeon: Melina Schools, MD;  Location: [facility];  Service: Orthopedics;  Laterality: N/A;  . BREAST BIOPSY Right 05/10/2017   right breast stereotatic bx path pending  . CESAREAN SECTION  2003 & 2010  . CHOLECYSTECTOMY  2005  . CYSTOSCOPY  09/11/2019   Procedure: CYSTOSCOPY;  Surgeon: Gae Dry, MD;  Location: [facility];  Service: Gynecology;;  . LUMBAR LAMINECTOMY/DECOMPRESSION MICRODISCECTOMY N/A 12/28/2017   Procedure: L4-S1 decompression and fusion;  Surgeon: Melina Schools, MD;   Location: [facility];  Service: Orthopedics;  Laterality: N/A;  4.5 hrs  . TOTAL LAPAROSCOPIC HYSTERECTOMY WITH SALPINGECTOMY Bilateral 09/11/2019   Procedure: TOTAL LAPAROSCOPIC HYSTERECTOMY WITH SALPINGECTOMY;  Surgeon: Gae Dry, MD;  Location: [facility];  Service: Gynecology;  Laterality: Bilateral;   Family History  Problem Relation Age of Onset  . Hyperlipidemia Father   . Diabetes Sister   . Diabetes Maternal Grandmother   . Cancer Maternal Grandmother        vaginal  . Cirrhosis Maternal Grandmother   . Colon polyps Maternal Grandmother   . Breast cancer Maternal Grandmother        Early 61's   Social History   Socioeconomic History  . Marital status: Married    Spouse name: Not on file  . Number of children: 3  . Years of education: Not on file  . Highest education level: Not on file  Occupational History  . Not on file  Tobacco Use  . Smoking status: Former Smoker    Packs/day: 1.00    Types: Cigarettes  . Smokeless tobacco: Never Used  Vaping Use  . Vaping Use: Never used  Substance and Sexual Activity  . Alcohol use: Yes    Alcohol/week: 0.0 standard drinks    Comment: rarely  . Drug use: No  . Sexual activity: Not on file  Other Topics Concern  . Not on file  Social History Narrative  . Not on file   Social Determinants of Health  Financial Resource Strain: Low Risk   . Difficulty of Paying Living Expenses: Not hard at all  Food Insecurity: Not on file  Transportation Needs: Not on file  Physical Activity: Insufficiently Active  . Days of Exercise per Week: 3 days  . Minutes of Exercise per Session: 30 min  Stress: Not on file  Social Connections: Not on file    Outpatient Encounter Medications as of 07/15/2020  Medication Sig  . pregabalin (LYRICA) 150 MG capsule Take 150 mg by mouth at bedtime.  Marland Kitchen amphetamine-dextroamphetamine (ADDERALL XR) 30 MG 24 hr capsule Take 30 mg by mouth daily.  Marland Kitchen escitalopram (LEXAPRO) 20 MG tablet TAKE 1 TABLET BY  MOUTH EVERY DAY  . fexofenadine (ALLEGRA) 60 MG tablet Take 60 mg by mouth 2 (two) times daily.   . Insulin Pen Needle (PEN NEEDLES) 32G X 4 MM MISC Inject 1 pen into the skin daily. Use with Saxenda  . pregabalin (LYRICA) 50 MG capsule TAKE 1 CAPSULE (50 MG TOTAL) BY MOUTH 2 (TWO) TIMES DAILY.  . rizatriptan (MAXALT) 10 MG tablet Take 10 mg by mouth daily as needed.  . Semaglutide-Weight Management (WEGOVY) 1.7 MG/0.75ML SOAJ Inject 1.7 mg into the skin once a week.  . venlafaxine XR (EFFEXOR-XR) 75 MG 24 hr capsule Take 75 mg by mouth daily.  Marland Kitchen zinc gluconate 50 MG tablet Take 50 mg by mouth daily.   . [DISCONTINUED] propranolol ER (INDERAL LA) 60 MG 24 hr capsule Take 60 mg by mouth at bedtime. (Patient not taking: No sig reported)  . [DISCONTINUED] Semaglutide-Weight Management 2.4 MG/0.75ML SOAJ Inject 2.4 mg into the skin once a week for 28 days. (Patient not taking: No sig reported)  . [DISCONTINUED] valACYclovir (VALTREX) 1000 MG tablet Take 1 tablet (1,000 mg total) by mouth 3 (three) times daily. (Patient not taking: No sig reported)   No facility-administered encounter medications on file as of 07/15/2020.    Review of Systems  Constitutional: Negative for appetite change and unexpected weight change.  HENT: Negative for congestion and sinus pressure.   Respiratory: Negative for cough, chest tightness and shortness of breath.   Cardiovascular: Negative for chest pain, palpitations and leg swelling.  Gastrointestinal: Negative for abdominal pain, diarrhea, nausea and vomiting.  Genitourinary: Negative for difficulty urinating and dysuria.  Musculoskeletal: Positive for back pain. Negative for joint swelling and myalgias.  Skin: Negative for color change and rash.  Neurological: Negative for dizziness, light-headedness and headaches.  Psychiatric/Behavioral: Negative for agitation and dysphoric mood.       Objective:    Physical Exam Vitals reviewed.  Constitutional:       General: She is not in acute distress.    Appearance: Normal appearance.  HENT:     Head: Normocephalic and atraumatic.     Right Ear: External ear normal.     Left Ear: External ear normal.  Eyes:     General: No scleral icterus.       Right eye: No discharge.        Left eye: No discharge.     Conjunctiva/sclera: Conjunctivae normal.  Neck:     Thyroid: No thyromegaly.  Cardiovascular:     Rate and Rhythm: Normal rate and regular rhythm.  Pulmonary:     Effort: No respiratory distress.     Breath sounds: Normal breath sounds. No wheezing.  Abdominal:     General: Bowel sounds are normal.     Palpations: Abdomen is soft.     Tenderness: There is  no abdominal tenderness.  Musculoskeletal:        General: No swelling or tenderness.     Cervical back: Neck supple. No tenderness.  Lymphadenopathy:     Cervical: No cervical adenopathy.  Skin:    Findings: No erythema or rash.  Neurological:     Mental Status: She is alert.  Psychiatric:        Mood and Affect: Mood normal.        Behavior: Behavior normal.     BP 118/70   Pulse 73   Temp 98 F (36.7 C) (Oral)   Resp 16   Ht 5\' 2"  (1.575 m)   Wt 172 lb (78 kg)   SpO2 98%   BMI 31.46 kg/m  Wt Readings from Last 3 Encounters:  07/15/20 172 lb (78 kg)  06/18/20 168 lb (76.2 kg)  03/12/20 168 lb (76.2 kg)     Lab Results  Component Value Date   WBC 8.1 07/11/2020   HGB 14.0 07/11/2020   HCT 41.5 07/11/2020   PLT 363.0 07/11/2020   GLUCOSE 82 07/11/2020   CHOL 210 (H) 07/11/2020   TRIG 76.0 07/11/2020   HDL 66.00 07/11/2020   LDLDIRECT 132.6 01/29/2013   LDLCALC 129 (H) 07/11/2020   ALT 17 07/11/2020   AST 18 07/11/2020   NA 138 07/11/2020   K 4.5 07/11/2020   CL 105 07/11/2020   CREATININE 0.82 07/11/2020   BUN 11 07/11/2020   CO2 27 07/11/2020   TSH 1.11 07/11/2020    CT BIOPSY  Result Date: 01/11/2020 CLINICAL DATA:  Left sacroiliac pain. EXAM: CT GUIDED LEFT SI JOINT INJECTION PROCEDURE: After  a thorough discussion of risks and benefits of the procedure, including bleeding, infection, injury to nerves, blood vessels, and adjacent structures, verbal and written consent was obtained. The patient was placed prone on the CT table and localization was performed over the sacrum. Target site marked using CT guidance. The skin was prepped and draped in the usual sterile fashion using Betadine soap. After local anesthesia with 1% lidocaine without epinephrine and subsequent deep anesthesia, a 3.5 inch 22 gauge spinal needle was advanced into the left SI joint under intermittent CT guidance. Despite the needle being within the joint, injection of a small amount of Isovue-M 200 initially demonstrated suboptimal contrast spread posterior to the joint. The needle was advanced slightly deeper into the joint, and a subsequent injection demonstrated intra-articular contrast spread. Subsequently, 120 mg of Depo-Medrol mixed with 1 mL of 0.5% bupivacaine were injected into the left SI joint. The needle was removed and a sterile dressing applied. No complications were observed. IMPRESSION: Successful CT-guided left SI joint injection. Electronically Signed   By: Logan Bores M.D.   On: 01/11/2020 15:29       Assessment & Plan:   Problem List Items Addressed This Visit    GAD (generalized anxiety disorder)    Being followed by Dr Nicolasa Ducking.  Continue lexapro and wellbutrin.       Hair loss    Is better.  Follow.        Headache    Migraine headache history.  Seeing neurology.  Continue mag oxide.  maxalt prn.  Follow.       Relevant Medications   pregabalin (LYRICA) 150 MG capsule   Hypercholesterolemia    .The 10-year ASCVD risk score Mikey Bussing DC Jr., et al., 2013) is: 0.5%   Values used to calculate the score:     Age: 62 years  Sex: Female     Is Non-Hispanic African American: No     Diabetic: No     Tobacco smoker: No     Systolic Blood Pressure: 662 mmHg     Is BP treated: No     HDL  Cholesterol: 66 mg/dL     Total Cholesterol: 210 mg/dL  Low cholesterol diet and exercise.  Follow lipid panel.       Low back pain    S/p surgery.  Chronic issues with pain. Is doing better.  Continue f/u with surgery.       Weight loss    Doing well on wegovy.  Watching her diet.  Follow.           [name], MD

## 2020-07-15 NOTE — Assessment & Plan Note (Addendum)
.  The 10-year ASCVD risk score Mikey Bussing DC Brooke Bonito., et al., 2013) is: 0.5%   Values used to calculate the score:     Age: 45 years     Sex: Female     Is Non-Hispanic African American: No     Diabetic: No     Tobacco smoker: No     Systolic Blood Pressure: 953 mmHg     Is BP treated: No     HDL Cholesterol: 66 mg/dL     Total Cholesterol: 210 mg/dL  Low cholesterol diet and exercise.  Follow lipid panel.

## 2020-07-20 ENCOUNTER — Encounter: Payer: Self-pay | Admitting: Internal Medicine

## 2020-07-20 NOTE — Assessment & Plan Note (Signed)
Is better.  Follow.

## 2020-07-20 NOTE — Assessment & Plan Note (Signed)
Doing well on wegovy.  Watching her diet.  Follow.

## 2020-07-20 NOTE — Assessment & Plan Note (Signed)
S/p surgery.  Chronic issues with pain. Is doing better.  Continue f/u with surgery.

## 2020-07-20 NOTE — Assessment & Plan Note (Signed)
Migraine headache history.  Seeing neurology.  Continue mag oxide.  maxalt prn.  Follow.

## 2020-07-20 NOTE — Assessment & Plan Note (Signed)
Being followed by Dr Nicolasa Ducking.  Continue lexapro and wellbutrin.

## 2020-08-20 ENCOUNTER — Ambulatory Visit: Payer: BC Managed Care – PPO | Admitting: Pharmacist

## 2020-08-20 DIAGNOSIS — E7800 Pure hypercholesterolemia, unspecified: Secondary | ICD-10-CM

## 2020-08-20 DIAGNOSIS — Z6830 Body mass index (BMI) 30.0-30.9, adult: Secondary | ICD-10-CM

## 2020-08-20 NOTE — Chronic Care Management (AMB) (Signed)
Care Management   Pharmacy Note  08/20/2020 Name: Ariel Morgan MRN: [id number] DOB: 04-Apr-1976  Subjective: Ariel Morgan is a 45 y.o. year old female who is a primary care patient of [name], MD. The Care Management team was consulted for assistance with care management and care coordination needs.    Engaged with patient by telephone for follow up visit in response to provider referral for pharmacy case management and/or care coordination services.   The patient was given information about Care Management services today including:  1. Care Management services includes personalized support from designated clinical staff supervised by the patient's primary care provider, including individualized plan of care and coordination with other care providers. 2. 24/7 contact phone numbers for assistance for urgent and routine care needs. 3. The patient may stop case management services at any time by phone call to the office staff.  Patient agreed to services and consent obtained.  Assessment:  Review of patient status, including review of consultants reports, laboratory and other test data, was performed as part of comprehensive evaluation and provision of chronic care management services.   SDOH (Social Determinants of Health) assessments and interventions performed:  SDOH Interventions   Flowsheet Row Most Recent Value  SDOH Interventions   Financial Strain Interventions Intervention Not Indicated       Objective:  Lab Results  Component Value Date   CREATININE 0.82 07/11/2020   CREATININE 0.63 05/23/2019   CREATININE 0.74 12/08/2018    No results found for: HGBA1C     Component Value Date/Time   CHOL 210 (H) 07/11/2020 0854   TRIG 76.0 07/11/2020 0854   HDL 66.00 07/11/2020 0854   CHOLHDL 3 07/11/2020 0854   VLDL 15.2 07/11/2020 0854   LDLCALC 129 (H) 07/11/2020 0854   LDLDIRECT 132.6 01/29/2013 0837     Clinical ASCVD: No  The 10-year ASCVD risk  score Mikey Bussing DC Jr., et al., 2013) is: 0.5%   Values used to calculate the score:     Age: 54 years     Sex: Female     Is Non-Hispanic African American: No     Diabetic: No     Tobacco smoker: No     Systolic Blood Pressure: 321 mmHg     Is BP treated: No     HDL Cholesterol: 66 mg/dL     Total Cholesterol: 210 mg/dL     BP Readings from Last 3 Encounters:  07/15/20 118/70  12/13/19 122/87  10/30/19 120/80    Care Plan  No Known Allergies  Medications Reviewed Today    Reviewed by [name], MD (Physician) on 07/20/20 at 2237  Med List Status: <None>  Medication Order Taking? Sig Documenting Provider Last Dose Status Informant  amphetamine-dextroamphetamine (ADDERALL XR) 30 MG 24 hr capsule [id number]  Take 30 mg by mouth daily. [provider]  Active   escitalopram (LEXAPRO) 20 MG tablet [id number]  TAKE 1 TABLET BY MOUTH EVERY DAY [name], MD  Active   fexofenadine (ALLEGRA) 60 MG tablet [id number]  Take 60 mg by mouth 2 (two) times daily.  [provider]  Active   Insulin Pen Needle (PEN NEEDLES) 32G X 4 MM MISC [id number]  Inject 1 pen into the skin daily. Use with [name], [name], MD  Active   pregabalin (LYRICA) 150 MG capsule [id number] Yes Take 150 mg by mouth at bedtime. [provider]  Active   pregabalin (LYRICA) 50 MG capsule [id number]  TAKE 1 CAPSULE (50  MG TOTAL) BY MOUTH 2 (TWO) TIMES DAILY. [name], MD  Active   propranolol ER (INDERAL LA) 60 MG 24 hr capsule [id number] No Take 60 mg by mouth at bedtime.  Patient not taking: No sig reported   [provider] Not Taking Active   rizatriptan (MAXALT) 10 MG tablet [id number]  Take 10 mg by mouth daily as needed. [provider]  Active   Semaglutide-Weight Management (WEGOVY) 1.7 MG/0.75ML SOAJ [id number]  Inject 1.7 mg into the skin once a week. [name], MD  Active   Semaglutide-Weight Management 2.4 MG/0.75ML Darden Palmer [id number] No Inject 2.4  mg into the skin once a week for 28 days.  Patient not taking: No sig reported   [name], MD Not Taking Active   valACYclovir (VALTREX) 1000 MG tablet [id number] No Take 1 tablet (1,000 mg total) by mouth 3 (three) times daily.  Patient not taking: No sig reported   Marval Regal, NP Not Taking Active   venlafaxine XR (EFFEXOR-XR) 75 MG 24 hr capsule [id number]  Take 75 mg by mouth daily. [provider]  Active   zinc gluconate 50 MG tablet [id number]  Take 50 mg by mouth daily.  [provider]  Active Self          Patient Active Problem List   Diagnosis Date Noted  . Cough 06/22/2020  . Hair loss 06/22/2020  . S/P laparoscopic hysterectomy 09/19/2019  . Fibroid 05/28/2019  . RLQ abdominal pain 05/28/2019  . Leg pain 02/25/2019  . Weight loss 11/19/2018  . Surgery, elective   . Hyperlipidemia   . Anxiety state   . Acute blood loss anemia   . Post-operative pain   . FUO (fever of unknown origin)   . S/P lumbar fusion 12/28/2017  . Pre-op evaluation 12/11/2017  . Abnormal mammogram 07/23/2017  . Microcalcification of right breast on mammogram 05/04/2017  . Low back pain 06/17/2015  . Health care maintenance 03/15/2015  . Herpes zoster 09/16/2014  . GAD (generalized anxiety disorder) 12/03/2012  . GERD (gastroesophageal reflux disease) 12/03/2012  . Headache 12/03/2012  . Hypercholesterolemia 12/03/2012    Conditions to be addressed/monitored: Migraines, Depression, Weight Management  Care Plan : Medication Management  Updates made by De Hollingshead, RPH-CPP since 08/20/2020 12:00 AM  Completed 08/20/2020  Problem: Obesity, Migraines, Depression Resolved 08/20/2020    Long-Range Goal: Disease Progression Prevention Completed 08/20/2020  This Visit's Progress: On track  Recent Progress: On track  Priority: High  Note:   Current Barriers:  . Unable to maintain control of weight  Pharmacist Clinical Goal(s):  Marland Kitchen Over the next 90 days,  patient will achieve adherence to monitoring guidelines and medication adherence to achieve therapeutic efficacy. through collaboration with PharmD and provider.  . Over the next 90 days, patient will achieve and maintain goal weight loss of 5-10% of baseline body weight  Interventions: . 1:1 collaboration with [name], MD regarding development and update of comprehensive plan of care as evidenced by provider attestation and co-signature . Inter-disciplinary care team collaboration (see longitudinal plan of care) . Comprehensive medication review performed; medication list updated in electronic medical record  Obesity: . Progressing with goal of weight loss; Current treatment: Wegovy 1.7 mg weekly  . Does note some nausea when she first started this dose. Some constipation, taking stool softener.  . Baseline weight prior to GLP1 therapy: 208 lbs. Most recent home weight: 172 lbs (total 36 lbs, 17% weight loss from baseline) . Advised to  increase hydration, fiber intake, physical activity for improvement in constipation.  . Patient elects to continue 1.7 mg weekly dose at this time. Can increase to 2.4 mg dose moving forward if needed.   Depression: . Moderately well managed. Current regimen: escitalopram 20 mg daily, venlafaxine XR 75 mg daily, follows w/ Dr. Toy Care . Continue current regimen at this time along with psychiatry collaboration  Peripheral Neuropathy: . Improved; Current treatment: pregabalin 50 mg BID, pregabalin 150 mg QPM . Recommended to current regimen at this time along with monitoring for sedation  Migraines: . Moderately well managed; current treatment: rizatriptan 10 mg PRN, OTC magnesium for prevention per neurology. . Continue collaboration with neurology at this time  Patient Goals/Self-Care Activities . Over the next 90 days, patient will:  - take medications as prescribed target a minimum of 150 minutes of moderate intensity exercise weekly engage in  dietary modifications by continuing to monitor portion sizes  Follow Up Plan: Goals of pharmacy care met. No follow up needed at this time     Medication Assistance:  None required.  Patient affirms current coverage meets needs.  Follow Up:  Patient requests no follow-up at this time.  Plan: Goals of care met. No follow up needed at this time  Catie [name], PharmD, [place], [place] Pharmacist [employer] at [place]

## 2020-08-20 NOTE — Patient Instructions (Signed)
Visit Information  Goals Addressed              This Visit's Progress     Patient Stated   .  COMPLETED: Medication Management (pt-stated)        Patient Goals/Self-Care Activities . Over the next 90 days, patient will:  - take medications as prescribed target a minimum of 150 minutes of moderate intensity exercise weekly engage in dietary modifications by continuing to monitor portion sizes           Patient verbalizes understanding of instructions provided today and agrees to view in [place].    Plan: Goals of care met. No follow up needed at this time  Catie [name], PharmD, [place], [place] Pharmacist [employer] at [place]

## 2020-10-10 ENCOUNTER — Telehealth: Payer: Self-pay | Admitting: Internal Medicine

## 2020-10-10 NOTE — Telephone Encounter (Signed)
Patient dropped off a form to be completed before having surgery. Form is up front in Dr [employer] color folder.

## 2020-10-13 NOTE — Telephone Encounter (Signed)
PT called to see if she needs a apt to have paperwork filled or if it has been completed.

## 2020-10-13 NOTE — Telephone Encounter (Signed)
Please advise, does Patient need an appointment?

## 2020-10-13 NOTE — Telephone Encounter (Signed)
Need to clarify what procedure is being done.  Will need appt for pre op evaluation.  Will need to work in.  Please confirm when they are planning to do her surgery (if has tentative date).  Hold for work in appt.

## 2020-10-14 ENCOUNTER — Encounter: Payer: Self-pay | Admitting: Internal Medicine

## 2020-10-14 NOTE — Telephone Encounter (Signed)
See my chart message. Can work in next week - 10/22/20 - 12:30.

## 2020-10-14 NOTE — Telephone Encounter (Signed)
I can see her at 12:30 on 10/22/20.  (Let me know if this is a problem).

## 2020-10-14 NOTE — Telephone Encounter (Signed)
Called and spoke to [place]. 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 She is out of town this week. Checked PCP schedule and it is fully booked, Would you like to work her in for next week?

## 2020-10-15 NOTE — Telephone Encounter (Signed)
Patient has been scheduled for 10/22/20 at 12:30pm , per Dr Nicki Reaper.

## 2020-10-15 NOTE — Telephone Encounter (Signed)
Patient has been scheduled and made aware by Santiago Glad.

## 2020-10-16 ENCOUNTER — Ambulatory Visit: Payer: Self-pay | Admitting: Orthopedic Surgery

## 2020-10-16 NOTE — Telephone Encounter (Signed)
Pt is scheduled for an appointment.

## 2020-10-17 ENCOUNTER — Ambulatory Visit: Payer: BC Managed Care – PPO | Admitting: Internal Medicine

## 2020-10-20 ENCOUNTER — Ambulatory Visit: Payer: Self-pay | Admitting: Orthopedic Surgery

## 2020-10-22 ENCOUNTER — Encounter: Payer: Self-pay | Admitting: Internal Medicine

## 2020-10-22 ENCOUNTER — Ambulatory Visit: Payer: BC Managed Care – PPO | Admitting: Internal Medicine

## 2020-10-22 ENCOUNTER — Other Ambulatory Visit: Payer: Self-pay

## 2020-10-22 VITALS — BP 116/76 | HR 100 | Temp 97.9°F | Resp 16 | Ht 62.0 in | Wt 155.8 lb

## 2020-10-22 DIAGNOSIS — R634 Abnormal weight loss: Secondary | ICD-10-CM

## 2020-10-22 DIAGNOSIS — Z1231 Encounter for screening mammogram for malignant neoplasm of breast: Secondary | ICD-10-CM

## 2020-10-22 DIAGNOSIS — E7800 Pure hypercholesterolemia, unspecified: Secondary | ICD-10-CM

## 2020-10-22 DIAGNOSIS — R519 Headache, unspecified: Secondary | ICD-10-CM

## 2020-10-22 DIAGNOSIS — G8929 Other chronic pain: Secondary | ICD-10-CM

## 2020-10-22 DIAGNOSIS — M5441 Lumbago with sciatica, right side: Secondary | ICD-10-CM

## 2020-10-22 DIAGNOSIS — Z01818 Encounter for other preprocedural examination: Secondary | ICD-10-CM

## 2020-10-22 DIAGNOSIS — F411 Generalized anxiety disorder: Secondary | ICD-10-CM

## 2020-10-22 NOTE — Progress Notes (Signed)
Patient ID: Ariel Morgan, female   DOB: 09/21/1975, 45 y.o.   MRN: [id number]   Subjective:    Patient ID: Ariel Morgan, female    DOB: 02-13-1976, 45 y.o.   MRN: [id number]  HPI This visit occurred during the SARS-CoV-2 public health emergency.  Safety protocols were in place, including screening questions prior to the visit, additional usage of staff PPE, and extensive cleaning of exam room while observing appropriate contact time as indicated for disinfecting solutions.   Patient here for pre op evaluation.  Planning for spinal cord stimulator placement - 10/30/20.  She had a successful spinal cord stimulator trial with near complete relief of her chronic buttock and neuropathic leg pain.  She is planning to have a permanent implantation.  Persistent back and leg pain as outlined.  States she is otherwise doing well.  Handling stress.  Tries to stay active.  Denies any chest pain, tightness or shortness of breath.  No increased cough or congestion.  No abdominal pain or cramping.  No nausea vomiting or diarrhea.  She has stopped her Wegovy in preparation for the surgery.  Sunday 717 was her last injection.  Past Medical History:  Diagnosis Date   Anemia    Anxiety    Arthritis    Frequent headaches    H/O   GERD (gastroesophageal reflux disease)    Hypercholesterolemia    Placenta previa    Past Surgical History:  Procedure Laterality Date   ABDOMINAL EXPOSURE N/A 01/01/2018   Procedure: ABDOMINAL EXPOSURE;  Surgeon: Marty Heck, MD;  Location: [facility];  Service: Vascular;  Laterality: N/A;   ANTERIOR LUMBAR FUSION N/A 01/01/2018   Procedure: ANTERIOR LUMBAR FUSION L5-S1; REPLACEMENT OF posterior HARDWARE L4-S1;  Surgeon: Melina Schools, MD;  Location: [facility];  Service: Orthopedics;  Laterality: N/A;   BREAST BIOPSY Right 05/10/2017   right breast stereotatic bx path pending   CESAREAN SECTION  2003 & 2010   CHOLECYSTECTOMY  2005   CYSTOSCOPY  09/11/2019   Procedure:  CYSTOSCOPY;  Surgeon: Gae Dry, MD;  Location: [facility];  Service: Gynecology;;   LUMBAR LAMINECTOMY/DECOMPRESSION MICRODISCECTOMY N/A 12/28/2017   Procedure: L4-S1 decompression and fusion;  Surgeon: Melina Schools, MD;  Location: [facility];  Service: Orthopedics;  Laterality: N/A;  4.5 hrs   TOTAL LAPAROSCOPIC HYSTERECTOMY WITH SALPINGECTOMY Bilateral 09/11/2019   Procedure: TOTAL LAPAROSCOPIC HYSTERECTOMY WITH SALPINGECTOMY;  Surgeon: Gae Dry, MD;  Location: [facility] OR[facility];  Service: Gynecology;  Laterality: Bilateral;   Family History  Problem Relation Age of Onset   Hyperlipidemia Father    Diabetes Sister    Diabetes Maternal Grandmother    Cancer Maternal Grandmother        vaginal   Cirrhosis Maternal Grandmother    Colon polyps Maternal Grandmother    Breast cancer Maternal Grandmother        Early 76's   Social History   Socioeconomic History   Marital status: Married    Spouse name: Not on file   Number of children: 3   Years of education: Not on file   Highest education level: Not on file  Occupational History   Not on file  Tobacco Use   Smoking status: Former    Packs/day: 1.00    Types: Cigarettes   Smokeless tobacco: Never  Vaping Use   Vaping Use: Never used  Substance and Sexual Activity   Alcohol use: Yes    Alcohol/week: 0.0 standard drinks    Comment: rarely  Drug use: No   Sexual activity: Not on file  Other Topics Concern   Not on file  Social History Narrative   Not on file   Social Determinants of Health   Financial Resource Strain: Low Risk    Difficulty of Paying Living Expenses: Not hard at all  Food Insecurity: Not on file  Transportation Needs: Not on file  Physical Activity: Not on file  Stress: Not on file  Social Connections: Not on file    Review of Systems  Constitutional:  Negative for appetite change and unexpected weight change.  HENT:  Negative for congestion and sinus pressure.   Respiratory:  Negative for cough,  chest tightness and shortness of breath.   Cardiovascular:  Negative for chest pain, palpitations and leg swelling.  Gastrointestinal:  Negative for abdominal pain, diarrhea, nausea and vomiting.  Genitourinary:  Negative for difficulty urinating and dysuria.  Musculoskeletal:  Negative for joint swelling and myalgias.       Back/buttock and leg pain as outlined.   Skin:  Negative for color change and rash.  Neurological:  Negative for dizziness, light-headedness and headaches.  Psychiatric/Behavioral:  Negative for agitation and dysphoric mood.       Objective:    Physical Exam Vitals reviewed.  Constitutional:      General: She is not in acute distress.    Appearance: Normal appearance.  HENT:     Head: Normocephalic and atraumatic.     Right Ear: External ear normal.     Left Ear: External ear normal.  Eyes:     General: No scleral icterus.       Right eye: No discharge.        Left eye: No discharge.     Conjunctiva/sclera: Conjunctivae normal.  Neck:     Thyroid: No thyromegaly.  Cardiovascular:     Rate and Rhythm: Normal rate and regular rhythm.  Pulmonary:     Effort: No respiratory distress.     Breath sounds: Normal breath sounds. No wheezing.  Abdominal:     General: Bowel sounds are normal.     Palpations: Abdomen is soft.     Tenderness: There is no abdominal tenderness.  Musculoskeletal:        General: No swelling or tenderness.     Cervical back: Neck supple. No tenderness.  Lymphadenopathy:     Cervical: No cervical adenopathy.  Skin:    Findings: No erythema or rash.  Neurological:     Mental Status: She is alert.  Psychiatric:        Mood and Affect: Mood normal.        Behavior: Behavior normal.    BP 116/76   Pulse 100   Temp 97.9 F (36.6 C)   Resp 16   Ht 5\' 2"  (1.575 m)   Wt 155 lb 12.8 oz (70.7 kg)   SpO2 98%   BMI 28.50 kg/m  Wt Readings from Last 3 Encounters:  10/22/20 155 lb 12.8 oz (70.7 kg)  07/15/20 172 lb (78 kg)   06/18/20 168 lb (76.2 kg)    Outpatient Encounter Medications as of 10/22/2020  Medication Sig   amphetamine-dextroamphetamine (ADDERALL XR) 30 MG 24 hr capsule Take 30 mg by mouth daily.   fexofenadine (ALLEGRA) 60 MG tablet Take 60 mg by mouth 2 (two) times daily.    pregabalin (LYRICA) 150 MG capsule Take 150 mg by mouth at bedtime.   pregabalin (LYRICA) 50 MG capsule TAKE 1 CAPSULE (50 MG TOTAL)  BY MOUTH 2 (TWO) TIMES DAILY.   rizatriptan (MAXALT) 10 MG tablet Take 10 mg by mouth daily as needed.   Semaglutide-Weight Management (WEGOVY) 1.7 MG/0.75ML SOAJ Inject 1.7 mg into the skin once a week.   venlafaxine XR (EFFEXOR-XR) 75 MG 24 hr capsule Take 75 mg by mouth daily.   zinc gluconate 50 MG tablet Take 50 mg by mouth daily.    [DISCONTINUED] escitalopram (LEXAPRO) 20 MG tablet TAKE 1 TABLET BY MOUTH EVERY DAY (Patient not taking: Reported on 10/22/2020)   [DISCONTINUED] Insulin Pen Needle (PEN NEEDLES) 32G X 4 MM MISC Inject 1 pen into the skin daily. Use with Kirke Shaggy (Patient not taking: Reported on 10/22/2020)   No facility-administered encounter medications on file as of 10/22/2020.     Lab Results  Component Value Date   WBC 8.1 07/11/2020   HGB 14.0 07/11/2020   HCT 41.5 07/11/2020   PLT 363.0 07/11/2020   GLUCOSE 82 07/11/2020   CHOL 210 (H) 07/11/2020   TRIG 76.0 07/11/2020   HDL 66.00 07/11/2020   LDLDIRECT 132.6 01/29/2013   LDLCALC 129 (H) 07/11/2020   ALT 17 07/11/2020   AST 18 07/11/2020   NA 138 07/11/2020   K 4.5 07/11/2020   CL 105 07/11/2020   CREATININE 0.82 07/11/2020   BUN 11 07/11/2020   CO2 27 07/11/2020   TSH 1.11 07/11/2020    CT BIOPSY  Result Date: 01/11/2020 CLINICAL DATA:  Left sacroiliac pain. EXAM: CT GUIDED LEFT SI JOINT INJECTION PROCEDURE: After a thorough discussion of risks and benefits of the procedure, including bleeding, infection, injury to nerves, blood vessels, and adjacent structures, verbal and written consent was obtained.  The patient was placed prone on the CT table and localization was performed over the sacrum. Target site marked using CT guidance. The skin was prepped and draped in the usual sterile fashion using Betadine soap. After local anesthesia with 1% lidocaine without epinephrine and subsequent deep anesthesia, a 3.5 inch 22 gauge spinal needle was advanced into the left SI joint under intermittent CT guidance. Despite the needle being within the joint, injection of a small amount of Isovue-M 200 initially demonstrated suboptimal contrast spread posterior to the joint. The needle was advanced slightly deeper into the joint, and a subsequent injection demonstrated intra-articular contrast spread. Subsequently, 120 mg of Depo-Medrol mixed with 1 mL of 0.5% bupivacaine were injected into the left SI joint. The needle was removed and a sterile dressing applied. No complications were observed. IMPRESSION: Successful CT-guided left SI joint injection. Electronically Signed   By: Logan Bores M.D.   On: 01/11/2020 15:29       Assessment & Plan:   Problem List Items Addressed This Visit     Breast cancer screening    She is overdue.  Need to schedule mammogram.       Relevant Orders   MM 3D SCREEN BREAST BILATERAL   GAD (generalized anxiety disorder)    Has been followed by Dr. Nicolasa Ducking.  Off Lexapro.  Continues on Effexor.       Headache    Sees neurology.  No increased headaches reported.       Hypercholesterolemia    She has adjusted her diet.  On Wegovy.  Has lost weight.  Continue diet and exercise.  Follow lipid panel.       Low back pain    Persistent back/buttock and leg pain as outlined.  Had a successful trial with a spinal cord stimulator.  Planning for  permanent implantation.       Pre-op evaluation - Primary    Planning for permanent spinal cord stimulator placement.  EKG obtained and revealed sinus rhythm with no acute ischemic changes noted.  She denies any chest pain, tightness or  shortness of breath.  Given no symptoms and given EKG findings I do feel that she is at low risk from a cardiac standpoint to proceed with the planned procedure.  She will need close Intra-Op and postop monitoring of her heart rate and blood pressures to avoid extremes.  She has her preop surgery scheduled for Monday.  Last dose of Wegovy was 10/19/2020.  She knows not to take her dose next Sunday.  She will discuss with surgery regarding date stopped.       Relevant Orders   EKG 12-Lead (Completed)   Weight loss    Has [facility] well with [facility].  Has adjusted her diet.  We will need to hold [facility] for the surgery.  She has stopped.  Her last injection was 10/19/2020.  She will discuss with surgery.         [name], MD

## 2020-10-23 ENCOUNTER — Telehealth: Payer: Self-pay

## 2020-10-23 NOTE — Telephone Encounter (Signed)
Prior authorization has been submitted for [facility] 2.4MG /0.75ML AUTO-INJECTORS  KEY [id number]

## 2020-10-24 ENCOUNTER — Encounter: Payer: Self-pay | Admitting: Internal Medicine

## 2020-10-24 DIAGNOSIS — Z1239 Encounter for other screening for malignant neoplasm of breast: Secondary | ICD-10-CM | POA: Insufficient documentation

## 2020-10-24 NOTE — Assessment & Plan Note (Signed)
Sees neurology.  No increased headaches reported.

## 2020-10-24 NOTE — Pre-Procedure Instructions (Signed)
Surgical Instructions    Your procedure is scheduled on Thursday 10/30/20.   Report to [facility] Main Entrance "A" at 05:30 A.M., then check in with the Admitting office.  Call this number if you have problems the morning of surgery:  [phone]   If you have any questions prior to your surgery date call [phone]: Open Monday-Friday 8am-4pm    Remember:  Do not eat or drink after midnight the night before your surgery     Take these medicines the morning of surgery with A SIP OF WATER   fexofenadine (ALLEGRA)  pregabalin (LYRICA)  venlafaxine XR (EFFEXOR-XR)   DO NOT TAKE Semaglutide-Weight Management (WEGOVY) the morning of surgery.  As of today, STOP taking any Aspirin (unless otherwise instructed by your surgeon) Aleve, Naproxen, Ibuprofen, Motrin, Advil, Goody's, BC's, all herbal medications, fish oil, and all vitamins.          Do not wear jewelry or makeup Do not wear lotions, powders, perfumes/colognes, or deodorant. Do not shave 48 hours prior to surgery.  Men may shave face and neck. Do not bring valuables to the hospital. DO Not wear nail polish, gel polish, artificial nails, or any other type of covering on natural nails including finger and toenails. If patients have artificial nails, gel coating, etc. that need to be removed by a nail salon please have this removed prior to surgery or surgery may need to be canceled/delayed if the surgeon/ anesthesia feels like the patient is unable to be adequately monitored.             [facility] is not responsible for any belongings or valuables.  Do NOT Smoke (Tobacco/Vaping) or drink Alcohol 24 hours prior to your procedure If you use a CPAP at night, you may bring all equipment for your overnight stay.   Contacts, glasses, dentures or bridgework may not be worn into surgery, please bring cases for these belongings   For patients admitted to the hospital, discharge time will be determined by your treatment team.    Patients discharged the day of surgery will not be allowed to drive home, and someone needs to stay with them for 24 hours.  ONLY 1 SUPPORT PERSON MAY BE PRESENT WHILE YOU ARE IN SURGERY. IF YOU ARE TO BE ADMITTED ONCE YOU ARE IN YOUR ROOM YOU WILL BE ALLOWED TWO (2) VISITORS.  Minor children may have two parents present. Special consideration for safety and communication needs will be reviewed on a case by case basis.  Special instructions:    Oral Hygiene is also important to reduce your risk of infection.  Remember - BRUSH YOUR TEETH THE MORNING OF SURGERY WITH YOUR REGULAR TOOTHPASTE   [facility]- Preparing For Surgery  Before surgery, you can play an important role. Because skin is not sterile, your skin needs to be as free of germs as possible. You can reduce the number of germs on your skin by washing with CHG (chlorahexidine gluconate) Soap before surgery.  CHG is an antiseptic cleaner which kills germs and bonds with the skin to continue killing germs even after washing.     Please do not use if you have an allergy to CHG or antibacterial soaps. If your skin becomes reddened/irritated stop using the CHG.  Do not shave (including legs and underarms) for at least 48 hours prior to first CHG shower. It is OK to shave your face.  Please follow these instructions carefully.     Shower the [employer] SURGERY and  the MORNING OF SURGERY with CHG Soap.   If you chose to wash your hair, wash your hair first as usual with your normal shampoo. After you shampoo, rinse your hair and body thoroughly to remove the shampoo.  Then [employer] and genitals (private parts) with your normal soap and rinse thoroughly to remove soap.  After that Use CHG Soap as you would any other liquid soap. You can apply CHG directly to the skin and wash gently with a scrungie or a clean washcloth.   Apply the CHG Soap to your body ONLY FROM THE NECK DOWN.  Do not use on open wounds or open sores. Avoid contact  with your eyes, ears, mouth and genitals (private parts). Wash Face and genitals (private parts)  with your normal soap.   Wash thoroughly, paying special attention to the area where your surgery will be performed.  Thoroughly rinse your body with warm water from the neck down.  DO NOT shower/wash with your normal soap after using and rinsing off the CHG Soap.  Pat yourself dry with a CLEAN TOWEL.  Wear CLEAN PAJAMAS to bed the night before surgery  Place CLEAN SHEETS on your bed the night before your surgery  DO NOT SLEEP WITH PETS.   Day of Surgery:  Take a shower with CHG soap. Wear Clean/Comfortable clothing the morning of surgery Do not apply any deodorants/lotions.   Remember to brush your teeth WITH YOUR REGULAR TOOTHPASTE.   Please read over the following fact sheets that you were given.

## 2020-10-24 NOTE — Assessment & Plan Note (Signed)
She has adjusted her diet.  On Wegovy.  Has lost weight.  Continue diet and exercise.  Follow lipid panel.

## 2020-10-24 NOTE — Assessment & Plan Note (Signed)
Has been followed by Dr. Nicolasa Ducking.  Off Lexapro.  Continues on Effexor.

## 2020-10-24 NOTE — Assessment & Plan Note (Signed)
Planning for permanent spinal cord stimulator placement.  EKG obtained and revealed sinus rhythm with no acute ischemic changes noted.  She denies any chest pain, tightness or shortness of breath.  Given no symptoms and given EKG findings I do feel that she is at low risk from a cardiac standpoint to proceed with the planned procedure.  She will need close Intra-Op and postop monitoring of her heart rate and blood pressures to avoid extremes.  She has her preop surgery scheduled for Monday.  Last dose of Wegovy was 10/19/2020.  She knows not to take her dose next Sunday.  She will discuss with surgery regarding date stopped.

## 2020-10-24 NOTE — Assessment & Plan Note (Signed)
Has done well with the [facility].  Has adjusted her diet.  We will need to hold [facility] for the surgery.  She has stopped.  Her last injection was 10/19/2020.  She will discuss with surgery.

## 2020-10-24 NOTE — Assessment & Plan Note (Signed)
She is overdue.  Need to schedule mammogram.

## 2020-10-24 NOTE — Assessment & Plan Note (Signed)
Persistent back/buttock and leg pain as outlined.  Had a successful trial with a spinal cord stimulator.  Planning for permanent implantation.

## 2020-10-27 ENCOUNTER — Other Ambulatory Visit: Payer: Self-pay

## 2020-10-27 ENCOUNTER — Encounter (HOSPITAL_COMMUNITY)
Admission: RE | Admit: 2020-10-27 | Discharge: 2020-10-27 | Disposition: A | Discharge status: Home / Self Care | Payer: BC Managed Care – PPO | Source: Ambulatory Visit | Attending: Orthopedic Surgery | Admitting: Orthopedic Surgery

## 2020-10-27 ENCOUNTER — Ambulatory Visit: Payer: Self-pay | Admitting: Orthopedic Surgery

## 2020-10-27 ENCOUNTER — Encounter (HOSPITAL_COMMUNITY): Payer: Self-pay

## 2020-10-27 ENCOUNTER — Ambulatory Visit (HOSPITAL_COMMUNITY)
Admission: RE | Admit: 2020-10-27 | Discharge: 2020-10-27 | Disposition: A | Discharge status: Home / Self Care | Payer: BC Managed Care – PPO | Source: Ambulatory Visit | Attending: Orthopedic Surgery | Admitting: Orthopedic Surgery

## 2020-10-27 DIAGNOSIS — Z01818 Encounter for other preprocedural examination: Secondary | ICD-10-CM

## 2020-10-27 DIAGNOSIS — Z20822 Contact with and (suspected) exposure to covid-19: Secondary | ICD-10-CM | POA: Insufficient documentation

## 2020-10-27 HISTORY — DX: Depression, unspecified: F32.A

## 2020-10-27 LAB — URINALYSIS, ROUTINE W REFLEX MICROSCOPIC
Bilirubin Urine: NEGATIVE
Glucose, UA: NEGATIVE mg/dL
Hgb urine dipstick: NEGATIVE
Ketones, ur: NEGATIVE mg/dL
Leukocytes,Ua: NEGATIVE
Nitrite: NEGATIVE
Protein, ur: NEGATIVE mg/dL
Specific Gravity, Urine: 1.01 (ref 1.005–1.030)
pH: 5 (ref 5.0–8.0)

## 2020-10-27 LAB — BASIC METABOLIC PANEL
Anion gap: 9 (ref 5–15)
BUN: 5 mg/dL — ABNORMAL LOW (ref 6–20)
CO2: 24 mmol/L (ref 22–32)
Calcium: 9.2 mg/dL (ref 8.9–10.3)
Chloride: 103 mmol/L (ref 98–111)
Creatinine, Ser: 0.67 mg/dL (ref 0.44–1.00)
GFR, Estimated: >60 mL/min (ref >60)
Glucose, Bld: 85 mg/dL (ref 70–99)
Potassium: 3.6 mmol/L (ref 3.5–5.1)
Sodium: 136 mmol/L (ref 135–145)

## 2020-10-27 LAB — CBC
HCT: 42 % (ref 36.0–46.0)
Hemoglobin: 13.9 g/dL (ref 12.0–15.0)
MCH: 29.3 pg (ref 26.0–34.0)
MCHC: 33.1 g/dL (ref 30.0–36.0)
MCV: 88.6 fL (ref 80.0–100.0)
Platelets: 328 10*3/uL (ref 150–400)
RBC: 4.74 MIL/uL (ref 3.87–5.11)
RDW: 12.1 % (ref 11.5–15.5)
WBC: 7.2 10*3/uL (ref 4.0–10.5)
nRBC: 0 % (ref 0.0–0.2)

## 2020-10-27 LAB — PROTIME-INR
INR: 0.9 (ref 0.8–1.2)
Prothrombin Time: 12.5 seconds (ref 11.4–15.2)

## 2020-10-27 LAB — SARS CORONAVIRUS 2 (TAT 6-24 HRS): SARS Coronavirus 2: NEGATIVE

## 2020-10-27 LAB — SURGICAL PCR SCREEN
MRSA, PCR: NEGATIVE
Staphylococcus aureus: NEGATIVE

## 2020-10-27 LAB — APTT: aPTT: 27 seconds (ref 24–36)

## 2020-10-27 NOTE — Progress Notes (Addendum)
PCP - [name], MD Cardiologist - denies  PPM/ICD - denies Device Orders - N/A Rep Notified - N/A  Chest x-ray - 10/27/2020 EKG - -10/22/2020 Stress Test - denies ECHO - denies Cardiac Cath - denies  Sleep Study - denies CPAP - N/A  Fasting Blood Sugar - N/A  Wegovy - last dose - 10/19/2020  Blood Thinner Instructions: N/A  Aspirin Instructions: Patient was instructed: As of today, STOP taking any Aspirin (unless otherwise instructed by your surgeon) Aleve, Naproxen, Ibuprofen, Motrin, Advil, Goody's, BC's, all herbal medications, fish oil, and all vitamins.  ERAS Protcol - no PRE-SURGERY Ensure or G2- N/A  COVID TEST- 10/27/2020   Anesthesia review: yes; family history of anesthesia complication  Patient denies shortness of breath, fever, cough and chest pain at PAT appointment   All instructions explained to the patient, with a verbal understanding of the material. Patient agrees to go over the instructions while at home for a better understanding. Patient also instructed to self quarantine after being tested for COVID-19. The opportunity to ask questions was provided.

## 2020-10-27 NOTE — H&P (Deleted)
  The note originally documented on this encounter has been moved the the encounter in which it belongs.  

## 2020-10-27 NOTE — H&P (Signed)
Subjective:   The patient is a 45 year old female who presents for pre-operative visit in preparation for their spinal cord stimulator placement , which is scheduled on 10/30/20 with Dr. Rolena Infante at [facility]. She had a successful spinal cord stimulator trial with near complete relief of her chronic back buttock and neuropathic leg pain  Patient Active Problem List   Diagnosis Date Noted   Breast cancer screening 10/24/2020   Cough 06/22/2020   Hair loss 06/22/2020   S/P laparoscopic hysterectomy 09/19/2019   Fibroid 05/28/2019   RLQ abdominal pain 05/28/2019   Leg pain 02/25/2019   Weight loss 11/19/2018   Surgery, elective    Hyperlipidemia    Anxiety state    Acute blood loss anemia    Post-operative pain    FUO (fever of unknown origin)    S/P lumbar fusion 12/28/2017   Pre-op evaluation 12/11/2017   Abnormal mammogram 07/23/2017   Microcalcification of right breast on mammogram 05/04/2017   Low back pain 06/17/2015   Health care maintenance 03/15/2015   Herpes zoster 09/16/2014   GAD (generalized anxiety disorder) 12/03/2012   GERD (gastroesophageal reflux disease) 12/03/2012   Headache 12/03/2012   Hypercholesterolemia 12/03/2012   Past Medical History:  Diagnosis Date   Anemia    Anxiety    Arthritis    Frequent headaches    H/O   GERD (gastroesophageal reflux disease)    Hypercholesterolemia    Placenta previa     Past Surgical History:  Procedure Laterality Date   ABDOMINAL EXPOSURE N/A 01/01/2018   Procedure: ABDOMINAL EXPOSURE;  Surgeon: Marty Heck, MD;  Location: [place];  Service: Vascular;  Laterality: N/A;   ANTERIOR LUMBAR FUSION N/A 01/01/2018   Procedure: ANTERIOR LUMBAR FUSION L5-S1; REPLACEMENT OF posterior HARDWARE L4-S1;  Surgeon: Melina Schools, MD;  Location: [facility];  Service: Orthopedics;  Laterality: N/A;   BREAST BIOPSY Right 05/10/2017   right breast stereotatic bx path pending   CESAREAN SECTION  2003 & 2010    CHOLECYSTECTOMY  2005   CYSTOSCOPY  09/11/2019   Procedure: CYSTOSCOPY;  Surgeon: Gae Dry, MD;  Location: [facility];  Service: Gynecology;;   LUMBAR LAMINECTOMY/DECOMPRESSION MICRODISCECTOMY N/A 12/28/2017   Procedure: L4-S1 decompression and fusion;  Surgeon: Melina Schools, MD;  Location: [place];  Service: Orthopedics;  Laterality: N/A;  4.5 hrs   TOTAL LAPAROSCOPIC HYSTERECTOMY WITH SALPINGECTOMY Bilateral 09/11/2019   Procedure: TOTAL LAPAROSCOPIC HYSTERECTOMY WITH SALPINGECTOMY;  Surgeon: Gae Dry, MD;  Location: [facility];  Service: Gynecology;  Laterality: Bilateral;    Current Outpatient Medications  Medication Sig Dispense Refill Last Dose   amphetamine-dextroamphetamine (ADDERALL XR) 30 MG 24 hr capsule Take 30 mg by mouth daily.      fexofenadine (ALLEGRA) 60 MG tablet Take 60 mg by mouth 2 (two) times daily.       pregabalin (LYRICA) 150 MG capsule Take 150 mg by mouth at bedtime.      pregabalin (LYRICA) 50 MG capsule TAKE 1 CAPSULE (50 MG TOTAL) BY MOUTH 2 (TWO) TIMES DAILY. 60 capsule 1    rizatriptan (MAXALT) 10 MG tablet Take 10 mg by mouth daily as needed.      Semaglutide-Weight Management (WEGOVY) 1.7 MG/0.75ML SOAJ Inject 1.7 mg into the skin once a week. 3 mL 2    venlafaxine XR (EFFEXOR-XR) 75 MG 24 hr capsule Take 75 mg by mouth daily.      zinc gluconate 50 MG tablet Take 50 mg by mouth daily.  No current facility-administered medications for this visit.   No Known Allergies  Social History   Tobacco Use   Smoking status: Former    Packs/day: 1.00    Types: Cigarettes   Smokeless tobacco: Never  Substance Use Topics   Alcohol use: Yes    Alcohol/week: 0.0 standard drinks    Comment: rarely    Family History  Problem Relation Age of Onset   Hyperlipidemia Father    Diabetes Sister    Diabetes Maternal Grandmother    Cancer Maternal Grandmother        vaginal   Cirrhosis Maternal Grandmother    Colon polyps Maternal Grandmother    Breast  cancer Maternal Grandmother        Early 70's    Review of Systems Pertinent items are noted in HPI.  Objective:   Vitals: Ht: 5 ft 3 in 10/27/2020 09:43 am BP: 110/74 10/27/2020 09:46 am Pulse: 89 bpm 10/27/2020 09:46 am  Clinical exam: Patient is alert and oriented 3. No shortness of breath, chest pain. Heart: Regular rate and rhythm, no rubs, murmurs, or gallops Lungs: Clear to auscultation bilaterally Abdomen: Soft and nontender. No loss of bowel and bladder control, no rebound tenderness. Bowel sounds 4 Lumbar spine: Mild to moderate low back pain which is fairly consistent. Severe pain radiating into the lower extremities Neuro: 5/5 motor strength in the lower external he bilaterally. Positive numbness/dysesthesias diffusely into the lower extremities . Negative straight leg raise test. Thoracic MRI completed 10/04/20 at [facility] was reviewed. I agree with the radiology report. No thoracic cord or intraspinal lesions. Severe bilateral T2-3 and moderate to severe right T1/2 facet arthrosis, but no significant neural foraminal narrowing. No significant central stenosis that would prohibit implantation of the spinal cord stimulator.  Assessment:   Ariel Morgan had a successful spinal cord stimulator trial with near complete relief of her chronic back buttock and neuropathic leg pain. At this point she is expressed a desire to move forward with a permanent implantation. I have gone over the surgical procedure in great detail including the risks, benefits, and alternatives. Risks and benefits of surgery were discussed with the patient. These include: Infection, bleeding, death, stroke, paralysis, ongoing or worse pain, need for additional surgery, leak of spinal fluid, Failure of the battery requiring reoperation. Inability to place the paddle requiring the surgery to be aborted. Migration of the lead, failure to obtain results similar to the trial.  Plan:   Placement of spinal cord  stimulator on 10/30/2020  We will obtain preoperative medical clearance from the patients primary care provider.  Patient is not on any blood thinners. Not using any aspirin products. Not taking any NSAIDs. She will hold her over-the-counter vitamins and supplements.  We have also discussed the post-operative recovery period to include: bathing/showering restrictions, wound healing, activity (and driving) restrictions, medications/pain mangement.  We have also discussed post-operative redflags to include: signs and symptoms of postoperative infection, DVT/PE.  Discharge instructions were reviewed with the patient at today's visit, she was given a copy.  Follow-up: 2 weeks post

## 2020-10-30 ENCOUNTER — Encounter (HOSPITAL_COMMUNITY): Payer: Self-pay | Admitting: Orthopedic Surgery

## 2020-10-30 ENCOUNTER — Observation Stay (HOSPITAL_COMMUNITY)
Admission: RE | Admit: 2020-10-30 | Discharge: 2020-10-30 | Disposition: A | Discharge status: Home / Self Care | Payer: BC Managed Care – PPO | Attending: Orthopedic Surgery | Admitting: Orthopedic Surgery

## 2020-10-30 ENCOUNTER — Ambulatory Visit (HOSPITAL_COMMUNITY): Payer: BC Managed Care – PPO | Admitting: Anesthesiology

## 2020-10-30 ENCOUNTER — Encounter (HOSPITAL_COMMUNITY)
Admission: RE | Disposition: A | Discharge status: Home / Self Care | Payer: Self-pay | Source: Home / Self Care | Attending: Orthopedic Surgery

## 2020-10-30 ENCOUNTER — Ambulatory Visit (HOSPITAL_COMMUNITY): Payer: BC Managed Care – PPO | Admitting: Physician Assistant

## 2020-10-30 ENCOUNTER — Ambulatory Visit (HOSPITAL_COMMUNITY): Payer: BC Managed Care – PPO

## 2020-10-30 DIAGNOSIS — Z419 Encounter for procedure for purposes other than remedying health state, unspecified: Secondary | ICD-10-CM

## 2020-10-30 DIAGNOSIS — G8929 Other chronic pain: Secondary | ICD-10-CM | POA: Diagnosis not present

## 2020-10-30 DIAGNOSIS — M961 Postlaminectomy syndrome, not elsewhere classified: Principal | ICD-10-CM | POA: Insufficient documentation

## 2020-10-30 DIAGNOSIS — M549 Dorsalgia, unspecified: Secondary | ICD-10-CM | POA: Diagnosis present

## 2020-10-30 DIAGNOSIS — Z87891 Personal history of nicotine dependence: Secondary | ICD-10-CM | POA: Diagnosis not present

## 2020-10-30 HISTORY — PX: SPINAL CORD STIMULATOR INSERTION: SHX5378

## 2020-10-30 SURGERY — INSERTION, SPINAL CORD STIMULATOR, LUMBAR
Anesthesia: General

## 2020-10-30 MED ORDER — FENTANYL CITRATE (PF) 250 MCG/5ML IJ SOLN
INTRAMUSCULAR | Status: AC
  Filled 2020-10-30: qty 5

## 2020-10-30 MED ORDER — ACETAMINOPHEN 10 MG/ML IV SOLN: 1000.0000 mg | Freq: Once | INTRAVENOUS | Status: DC | PRN

## 2020-10-30 MED ORDER — OXYCODONE-ACETAMINOPHEN 10-325 MG PO TABS: 1.0000 | ORAL_TABLET | Freq: Four times a day (QID) | ORAL | 0 refills | Status: AC | PRN

## 2020-10-30 MED ORDER — DEXAMETHASONE SODIUM PHOSPHATE 10 MG/ML IJ SOLN
INTRAMUSCULAR | Status: DC | PRN
  Administered 2020-10-30: 10 mg via INTRAVENOUS

## 2020-10-30 MED ORDER — LIDOCAINE 2% (20 MG/ML) 5 ML SYRINGE
INTRAMUSCULAR | Status: DC | PRN
  Administered 2020-10-30: 40 mg via INTRAVENOUS

## 2020-10-30 MED ORDER — IRBESARTAN 75 MG PO TABS
37.5000 mg | ORAL_TABLET | Freq: Every day | ORAL | Status: DC
  Filled 2020-10-30: qty 0.5

## 2020-10-30 MED ORDER — SCOPOLAMINE 1 MG/3DAYS TD PT72
MEDICATED_PATCH | TRANSDERMAL | Status: AC
  Filled 2020-10-30: qty 1

## 2020-10-30 MED ORDER — VENLAFAXINE HCL ER 75 MG PO CP24: 75.0000 mg | ORAL_CAPSULE | Freq: Every day | ORAL | Status: DC

## 2020-10-30 MED ORDER — PREGABALIN 75 MG PO CAPS: 150.0000 mg | ORAL_CAPSULE | Freq: Every day | ORAL | Status: DC

## 2020-10-30 MED ORDER — OXYCODONE HCL 5 MG PO TABS: 5.0000 mg | ORAL_TABLET | ORAL | Status: DC | PRN

## 2020-10-30 MED ORDER — ACETAMINOPHEN 325 MG PO TABS: 325.0000 mg | ORAL_TABLET | ORAL | Status: DC | PRN

## 2020-10-30 MED ORDER — FENTANYL CITRATE (PF) 100 MCG/2ML IJ SOLN
25.0000 ug | INTRAMUSCULAR | Status: DC | PRN
  Administered 2020-10-30 (×2): 25 ug via INTRAVENOUS

## 2020-10-30 MED ORDER — PROPOFOL 10 MG/ML IV BOLUS
INTRAVENOUS | Status: DC | PRN
  Administered 2020-10-30: 140 mg via INTRAVENOUS

## 2020-10-30 MED ORDER — AMISULPRIDE (ANTIEMETIC) 5 MG/2ML IV SOLN: 10.0000 mg | Freq: Once | INTRAVENOUS | Status: DC | PRN

## 2020-10-30 MED ORDER — ROCURONIUM BROMIDE 10 MG/ML (PF) SYRINGE
PREFILLED_SYRINGE | INTRAVENOUS | Status: AC
  Filled 2020-10-30: qty 10

## 2020-10-30 MED ORDER — CHLORHEXIDINE GLUCONATE 0.12 % MT SOLN
15.0000 mL | Freq: Once | OROMUCOSAL | Status: AC
  Administered 2020-10-30: 15 mL via OROMUCOSAL
  Filled 2020-10-30: qty 15

## 2020-10-30 MED ORDER — 0.9 % SODIUM CHLORIDE (POUR BTL) OPTIME
TOPICAL | Status: DC | PRN
  Administered 2020-10-30: 1000 mL

## 2020-10-30 MED ORDER — METHOCARBAMOL 500 MG PO TABS: 500.0000 mg | ORAL_TABLET | Freq: Three times a day (TID) | ORAL | 0 refills | Status: AC | PRN

## 2020-10-30 MED ORDER — SUGAMMADEX SODIUM 200 MG/2ML IV SOLN
INTRAVENOUS | Status: DC | PRN
  Administered 2020-10-30: 200 mg via INTRAVENOUS

## 2020-10-30 MED ORDER — ACETAMINOPHEN 10 MG/ML IV SOLN
INTRAVENOUS | Status: AC
  Filled 2020-10-30: qty 100

## 2020-10-30 MED ORDER — SODIUM CHLORIDE 0.9% FLUSH: 3.0000 mL | INTRAVENOUS | Status: DC | PRN

## 2020-10-30 MED ORDER — SODIUM CHLORIDE 0.9% FLUSH
3.0000 mL | Freq: Two times a day (BID) | INTRAVENOUS | Status: DC
  Administered 2020-10-30: 3 mL via INTRAVENOUS

## 2020-10-30 MED ORDER — DOCUSATE SODIUM 100 MG PO CAPS: 100.0000 mg | ORAL_CAPSULE | Freq: Two times a day (BID) | ORAL | Status: DC

## 2020-10-30 MED ORDER — METHOCARBAMOL 500 MG PO TABS
ORAL_TABLET | ORAL | Status: AC
  Administered 2020-10-30: 500 mg via ORAL
  Filled 2020-10-30: qty 1

## 2020-10-30 MED ORDER — HEMOSTATIC AGENTS (NO CHARGE) OPTIME
TOPICAL | Status: DC | PRN
  Administered 2020-10-30: 1 via TOPICAL

## 2020-10-30 MED ORDER — THROMBIN (RECOMBINANT) 20000 UNITS EX SOLR
CUTANEOUS | Status: AC
  Filled 2020-10-30: qty 20000

## 2020-10-30 MED ORDER — ONDANSETRON HCL 4 MG/2ML IJ SOLN
INTRAMUSCULAR | Status: AC
  Filled 2020-10-30: qty 2

## 2020-10-30 MED ORDER — SCOPOLAMINE 1 MG/3DAYS TD PT72
1.0000 | MEDICATED_PATCH | TRANSDERMAL | Status: DC
  Administered 2020-10-30: 1 via TRANSDERMAL

## 2020-10-30 MED ORDER — MIDAZOLAM HCL 2 MG/2ML IJ SOLN
INTRAMUSCULAR | Status: AC
  Filled 2020-10-30: qty 2

## 2020-10-30 MED ORDER — ACETAMINOPHEN 10 MG/ML IV SOLN
INTRAVENOUS | Status: DC | PRN
  Administered 2020-10-30: 1000 mg via INTRAVENOUS

## 2020-10-30 MED ORDER — DEXAMETHASONE SODIUM PHOSPHATE 10 MG/ML IJ SOLN
INTRAMUSCULAR | Status: AC
  Filled 2020-10-30: qty 1

## 2020-10-30 MED ORDER — ACETAMINOPHEN 325 MG PO TABS: 650.0000 mg | ORAL_TABLET | ORAL | Status: DC | PRN

## 2020-10-30 MED ORDER — FENTANYL CITRATE (PF) 250 MCG/5ML IJ SOLN
INTRAMUSCULAR | Status: DC | PRN
  Administered 2020-10-30 (×3): 50 ug via INTRAVENOUS

## 2020-10-30 MED ORDER — ONDANSETRON HCL 4 MG/2ML IJ SOLN: 4.0000 mg | Freq: Four times a day (QID) | INTRAMUSCULAR | Status: DC | PRN

## 2020-10-30 MED ORDER — MENTHOL 3 MG MT LOZG: 1.0000 | LOZENGE | OROMUCOSAL | Status: DC | PRN

## 2020-10-30 MED ORDER — MIDAZOLAM HCL 2 MG/2ML IJ SOLN
INTRAMUSCULAR | Status: DC | PRN
  Administered 2020-10-30: 2 mg via INTRAVENOUS

## 2020-10-30 MED ORDER — METHOCARBAMOL 500 MG PO TABS: 500.0000 mg | ORAL_TABLET | Freq: Four times a day (QID) | ORAL | Status: DC | PRN

## 2020-10-30 MED ORDER — ONDANSETRON HCL 4 MG PO TABS: 4.0000 mg | ORAL_TABLET | Freq: Three times a day (TID) | ORAL | 0 refills | Status: DC | PRN

## 2020-10-30 MED ORDER — LACTATED RINGERS IV SOLN: INTRAVENOUS | Status: DC

## 2020-10-30 MED ORDER — PROPOFOL 10 MG/ML IV BOLUS
INTRAVENOUS | Status: AC
  Filled 2020-10-30: qty 20

## 2020-10-30 MED ORDER — OXYCODONE HCL 5 MG PO TABS
ORAL_TABLET | ORAL | Status: AC
  Administered 2020-10-30: 10 mg via ORAL
  Filled 2020-10-30: qty 2

## 2020-10-30 MED ORDER — PHENOL 1.4 % MT LIQD: 1.0000 | OROMUCOSAL | Status: DC | PRN

## 2020-10-30 MED ORDER — SODIUM CHLORIDE 0.9 % IV SOLN: 250.0000 mL | INTRAVENOUS | Status: DC

## 2020-10-30 MED ORDER — OXYCODONE HCL 5 MG PO TABS: 5.0000 mg | ORAL_TABLET | Freq: Once | ORAL | Status: DC | PRN

## 2020-10-30 MED ORDER — PROMETHAZINE HCL 25 MG/ML IJ SOLN
INTRAMUSCULAR | Status: AC
  Administered 2020-10-30: 6.25 mg via INTRAVENOUS
  Filled 2020-10-30: qty 1

## 2020-10-30 MED ORDER — PROPOFOL 1000 MG/100ML IV EMUL
INTRAVENOUS | Status: AC
  Filled 2020-10-30: qty 100

## 2020-10-30 MED ORDER — PREGABALIN 50 MG PO CAPS: 50.0000 mg | ORAL_CAPSULE | Freq: Two times a day (BID) | ORAL | Status: DC

## 2020-10-30 MED ORDER — FENTANYL CITRATE (PF) 100 MCG/2ML IJ SOLN
INTRAMUSCULAR | Status: AC
  Administered 2020-10-30: 25 ug via INTRAVENOUS
  Filled 2020-10-30: qty 2

## 2020-10-30 MED ORDER — METHOCARBAMOL 1000 MG/10ML IJ SOLN
500.0000 mg | Freq: Four times a day (QID) | INTRAVENOUS | Status: DC | PRN
  Filled 2020-10-30: qty 5

## 2020-10-30 MED ORDER — ROCURONIUM BROMIDE 10 MG/ML (PF) SYRINGE
PREFILLED_SYRINGE | INTRAVENOUS | Status: DC | PRN
  Administered 2020-10-30: 50 mg via INTRAVENOUS

## 2020-10-30 MED ORDER — ONDANSETRON HCL 4 MG PO TABS: 4.0000 mg | ORAL_TABLET | Freq: Four times a day (QID) | ORAL | Status: DC | PRN

## 2020-10-30 MED ORDER — BUPIVACAINE-EPINEPHRINE (PF) 0.25% -1:200000 IJ SOLN
INTRAMUSCULAR | Status: AC
  Filled 2020-10-30: qty 30

## 2020-10-30 MED ORDER — ACETAMINOPHEN 160 MG/5ML PO SOLN: 325.0000 mg | ORAL | Status: DC | PRN

## 2020-10-30 MED ORDER — OXYCODONE HCL 5 MG/5ML PO SOLN
5.0000 mg | Freq: Once | ORAL | Status: DC | PRN
Start: 2020-10-30 — End: 2020-10-30

## 2020-10-30 MED ORDER — POLYETHYLENE GLYCOL 3350 17 G PO PACK: 17.0000 g | PACK | Freq: Every day | ORAL | Status: DC | PRN

## 2020-10-30 MED ORDER — ONDANSETRON HCL 4 MG/2ML IJ SOLN
INTRAMUSCULAR | Status: DC | PRN
  Administered 2020-10-30: 4 mg via INTRAVENOUS

## 2020-10-30 MED ORDER — LIDOCAINE 2% (20 MG/ML) 5 ML SYRINGE
INTRAMUSCULAR | Status: AC
  Filled 2020-10-30: qty 5

## 2020-10-30 MED ORDER — OXYCODONE HCL 5 MG PO TABS
10.0000 mg | ORAL_TABLET | ORAL | Status: DC | PRN
  Administered 2020-10-30: 10 mg via ORAL
  Filled 2020-10-30: qty 2

## 2020-10-30 MED ORDER — ORAL CARE MOUTH RINSE: 15.0000 mL | Freq: Once | OROMUCOSAL | Status: AC

## 2020-10-30 MED ORDER — BUPIVACAINE-EPINEPHRINE 0.25% -1:200000 IJ SOLN
INTRAMUSCULAR | Status: DC | PRN
  Administered 2020-10-30: 10 mL

## 2020-10-30 MED ORDER — ACETAMINOPHEN 650 MG RE SUPP: 650.0000 mg | RECTAL | Status: DC | PRN

## 2020-10-30 MED ORDER — CEFAZOLIN SODIUM-DEXTROSE 1-4 GM/50ML-% IV SOLN: 1.0000 g | Freq: Three times a day (TID) | INTRAVENOUS | Status: DC

## 2020-10-30 MED ORDER — PROMETHAZINE HCL 25 MG/ML IJ SOLN: 6.2500 mg | INTRAMUSCULAR | Status: DC | PRN

## 2020-10-30 MED ORDER — CEFAZOLIN SODIUM-DEXTROSE 2-4 GM/100ML-% IV SOLN
2.0000 g | INTRAVENOUS | Status: AC
  Administered 2020-10-30: 2 g via INTRAVENOUS
  Filled 2020-10-30: qty 100

## 2020-10-30 SURGICAL SUPPLY — 57 items
AGENT HMST KT MTR STRL THRMB (HEMOSTASIS) ×1
ANCH LD SWIFT-LCK ×2 IMPLANT
ANCHOR SWIFT LOCK ×4 IMPLANT
BAG COUNTER SPONGE SURGICOUNT (BAG) ×1 IMPLANT
BAG SPNG CNTER NS LX DISP (BAG) ×1
CANISTER SUCT 3000ML PPV (MISCELLANEOUS) ×2 IMPLANT
CLSR STERI-STRIP ANTIMIC 1/2X4 (GAUZE/BANDAGES/DRESSINGS) ×4 IMPLANT
COVER MAYO STAND STRL (DRAPES) ×2 IMPLANT
COVER PROBE W GEL 5X96 (DRAPES) IMPLANT
COVER SURGICAL LIGHT HANDLE (MISCELLANEOUS) ×2 IMPLANT
DRAPE C-ARM 42X72 X-RAY (DRAPES) ×2 IMPLANT
DRAPE SURG 17X23 STRL (DRAPES) ×2 IMPLANT
DRAPE U-SHAPE 47X51 STRL (DRAPES) ×2 IMPLANT
DRSG OPSITE POSTOP 4X6 (GAUZE/BANDAGES/DRESSINGS) ×3 IMPLANT
DURAPREP 26ML APPLICATOR (WOUND CARE) ×2 IMPLANT
ELECT BLADE 4.0 EZ CLEAN MEGAD (MISCELLANEOUS)
ELECT CAUTERY BLADE 6.4 (BLADE) IMPLANT
ELECT PENCIL ROCKER SW 15FT (MISCELLANEOUS) ×2 IMPLANT
ELECT REM PT RETURN 9FT ADLT (ELECTROSURGICAL) ×2
ELECTRODE BLDE 4.0 EZ CLN MEGD (MISCELLANEOUS) IMPLANT
ELECTRODE REM PT RTRN 9FT ADLT (ELECTROSURGICAL) ×1 IMPLANT
GENERATOR PULSE PROCLAIM 5ELIT (Neuro Prosthesis/Implant) IMPLANT
GLOVE SURG ENC MOIS LTX SZ7 (GLOVE) ×2 IMPLANT
GLOVE SURG GAMMEX LF SZ8.5 (GLOVE) ×2 IMPLANT
GLOVE SURG UNDER POLY LF SZ7 (GLOVE) ×2 IMPLANT
GLOVE SURG UNDER POLY LF SZ8.5 (GLOVE) ×2 IMPLANT
GOWN STRL REUS W/ TWL LRG LVL3 (GOWN DISPOSABLE) ×2 IMPLANT
GOWN STRL REUS W/TWL 2XL LVL3 (GOWN DISPOSABLE) ×2 IMPLANT
GOWN STRL REUS W/TWL LRG LVL3 (GOWN DISPOSABLE) ×4
KIT BASIN OR (CUSTOM PROCEDURE TRAY) ×2 IMPLANT
KIT TURNOVER KIT B (KITS) ×2 IMPLANT
LEAD OCTRODE GEN 8CH 60CM (Lead) ×4 IMPLANT
NEEDLE 22X1 1/2 (OR ONLY) (NEEDLE) ×2 IMPLANT
NEEDLE SPNL 18GX3.5 QUINCKE PK (NEEDLE) ×2 IMPLANT
NS IRRIG 1000ML POUR BTL (IV SOLUTION) ×2 IMPLANT
PACK LAMINECTOMY ORTHO (CUSTOM PROCEDURE TRAY) ×2 IMPLANT
PACK UNIVERSAL I (CUSTOM PROCEDURE TRAY) ×2 IMPLANT
PAD ARMBOARD 7.5X6 YLW CONV (MISCELLANEOUS) ×7 IMPLANT
PROGRAMMER DBS W/MAGNET NMRI (MISCELLANEOUS) ×2 IMPLANT
PULSE GENERATOR PROCLAIM 5ELIT (Neuro Prosthesis/Implant) ×2 IMPLANT
SPATULA SILICONE BRAIN 10MM (MISCELLANEOUS) IMPLANT
SPONGE SURGIFOAM ABS GEL 100 (HEMOSTASIS) ×1 IMPLANT
SPONGE T-LAP 4X18 ~~LOC~~+RFID (SPONGE) ×2 IMPLANT
STAPLER VISISTAT 35W (STAPLE) ×1 IMPLANT
SURGIFLO W/THROMBIN 8M KIT (HEMOSTASIS) ×2 IMPLANT
SUT BONE WAX W31G (SUTURE) ×2 IMPLANT
SUT ETHIBOND 2 OS 4 DA (SUTURE) ×2 IMPLANT
SUT MNCRL AB 3-0 PS2 18 (SUTURE) ×4 IMPLANT
SUT VIC AB 1 CT1 18XCR BRD 8 (SUTURE) ×2 IMPLANT
SUT VIC AB 1 CT1 8-18 (SUTURE) ×4
SUT VIC AB 2-0 CT1 18 (SUTURE) ×2 IMPLANT
SYR BULB IRRIG 60ML STRL (SYRINGE) ×2 IMPLANT
SYR CONTROL 10ML LL (SYRINGE) ×2 IMPLANT
TOWEL GREEN STERILE (TOWEL DISPOSABLE) ×2 IMPLANT
TOWEL GREEN STERILE FF (TOWEL DISPOSABLE) ×2 IMPLANT
WATER STERILE IRR 1000ML POUR (IV SOLUTION) ×2 IMPLANT
YANKAUER SUCT BULB TIP NO VENT (SUCTIONS) ×2 IMPLANT

## 2020-10-30 NOTE — Op Note (Signed)
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  2 percutaneous leads  BRIEF HISTORY: Ariel Morgan is a 45 y.o. female who underwent an L4-S1 instrumented fusion for a grade 3 spondylolisthesis several years ago.  He recently started having recurrent back and radicular leg pain ultimately she had a spinal cord stimulator trial and noted significant improvement in her quality of life.  As result of the successful trial we elected to move forward with permanent implantation.  I reviewed all the risks, benefits, and alternatives to surgery and her questions were all addressed.  PROCEDURE DETAILS: Patient was brought into the operating room and was properly positioned on the operating room table, and teds and SCDs were applied.  After induction with general anesthesia the patient was endotracheally intubated.  A timeout was taken to confirm all important data: Including patient, procedure, and the level.  Imaging studies were then done to confirm the T12 and T10 spinous process levels.  I marked out an incision spanning this distance and infiltrated with quarter percent Marcaine with epinephrine.  A separate battery incision site on the right gluteal region was marked in the holding area and this was also in injected with quarter percent Marcaine with epinephrine.  Midline thoracic incision was made and sharp dissection was carried out down to the deep fascia.  I incised the deep fascia to expose the inferior third of the spinous process and lamina of T10 the  entire T11 spinous process and the superior third of the T12.  I continued dissecting down the spinous process and out to the lamina to the level of the facet complex.  Once I had the posterior spine exposed I placed my self-retaining retractor.  I then placed a Penfield 4 between the T10 and T11 spinous process and reimaged the patient.  I confirmed that I was at the appropriate level.  I then removed the inferior third of the spinous process of T10 and the superior third of the spinous process of T11.  I then used a 2 and 3 mm Kerrison rongeur to perform a laminotomy of T10.  I Penfield 4 was then used to dissect through the central raphae of the ligamentum flavum and then I resected this with a 2 mm Kerrison rongeur to expose the dorsal epidural fat.  I gently dissected through this until I could visualize the thecal sac.  Once the thecal sac was visualized I then obtained the percutaneous lead and gently advanced it just to the right of midline and the second was just to the left of midline.  Both leads were easily placed there was no tension or difficulty in advancing them.  Imaging studies confirmed satisfactory positioning of the implants in both the AP and lateral planes.  I confirmed with the representative from the spinal cord stimulator company that we were covering the same area that was done during the trial so that we could obtain similar results.  Once I confirm satisfactory position in both planes with the left I then secured the leads directly to the spinous process of T11  using the Ethibond suture.  I then wrapped the lead around the T 11 spinous process by making small incision interspinous ligament between T11 and T12.  Reduced lead now secured I then turned my attention to the second incision.  A gluteal incision was made and I bluntly dissected down approximately 2 and half centimeters to create a pocket for the battery.  Using the submuscular passer I advanced the leads from the thoracic  incision to the gluteal incision.  I then secured them into the lead and disconnected the Bovie and bipolar.  The leads were then tightened into the battery according manufacture standards.  The battery was then placed into the pocket and the excess lead was coiled and placed underneath the battery.  I then secured the battery directly to the deep subcutaneous fascia with 2 interrupted #1 Vicryl sutures.  At this point the system was tested and both leads were functioning.  Both incision sites were then copiously irrigated with normal saline and I Floseal to obtain hemostasis.  After final irrigation the deep fascia of both wounds were closed with interrupted #1 Vicryl suture.  Then a layer of 2-0 Vicryl suture, and finally 3-0 Monocryl for the skin.  Steri-Strips and dry dressings were applied and the patient was ultimately extubated and transferred to the PACU without incident.  The end of the case all needle and sponge counts were correct.  There were no adverse intraoperative events.  Melina Schools, MD 10/30/2020 9:01 AM

## 2020-10-30 NOTE — Anesthesia Preprocedure Evaluation (Addendum)
Anesthesia Evaluation  Patient identified by MRN, date of birth, ID band Patient awake    Reviewed: Allergy & Precautions, NPO status , Patient's Chart, lab work & pertinent test results  Airway Mallampati: II  TM Distance: >3 FB Neck ROM: Full  Mouth opening: Limited Mouth Opening  Dental  (+) Teeth Intact, Dental Advisory Given   Pulmonary former smoker,    breath sounds clear to auscultation       Cardiovascular hypertension, Pt. on medications  Rhythm:Regular Rate:Normal     Neuro/Psych  Headaches, PSYCHIATRIC DISORDERS Anxiety Depression    GI/Hepatic Neg liver ROS, GERD  ,  Endo/Other  negative endocrine ROS  Renal/GU negative Renal ROS     Musculoskeletal  (+) Arthritis ,   Abdominal Normal abdominal exam  (+)   Peds  Hematology negative hematology ROS (+)   Anesthesia Other Findings   Reproductive/Obstetrics                            Anesthesia Physical Anesthesia Plan  ASA: 2  Anesthesia Plan: General   Post-op Pain Management:    Induction: Intravenous  PONV Risk Score and Plan: 4 or greater and Ondansetron, Dexamethasone, Midazolam and Scopolamine patch - Pre-op  Airway Management Planned: Oral ETT  Additional Equipment: None  Intra-op Plan:   Post-operative Plan: Extubation in OR  Informed Consent: I have reviewed the patients History and Physical, chart, labs and discussed the procedure including the risks, benefits and alternatives for the proposed anesthesia with the patient or authorized representative who has indicated his/her understanding and acceptance.     Dental advisory given  Plan Discussed with: CRNA  Anesthesia Plan Comments:        Anesthesia Quick Evaluation

## 2020-10-30 NOTE — Evaluation (Signed)
Occupational Therapy Evaluation Patient Details Name: Ariel Morgan MRN: [id number] DOB: 1976/03/20 Today's Date: 10/30/2020    History of Present Illness 45 yo F presenting for spinal cord stimulator placement.  PMH includes PLIF and Anemia, Anxiety, Arthritis, Frequent headaches, GERD, Hypercholesterolemia, Placenta previa.   Clinical Impression   Patient admitted for the diagnosis and procedure above.  PTA she lives with her spouse, who is able to provide assist as needed.  Currently she has expected post op pain, but is close to her baseline for mobility and ADL completion.  She is expecting to discharge home today.  Of note, patient has no PT needs, and they can sign off if needed.  She is walking the halls independently.      Follow Up Recommendations  No OT follow up    Equipment Recommendations  None recommended by OT    Recommendations for Other Services       Precautions / Restrictions Precautions Precautions: Back Precaution Booklet Issued: No Precaution Comments: Reviewed Restrictions Weight Bearing Restrictions: No      Mobility Bed Mobility Overal bed mobility: Modified Independent               Patient Response: Flat affect  Transfers Overall transfer level: Modified independent                    Balance Overall balance assessment: No apparent balance deficits (not formally assessed)                                         ADL either performed or assessed with clinical judgement   ADL Overall ADL's : At baseline                                       General ADL Comments: despite pain she is independent with mobility in the halls.  Able to complete ADL from modified sit/stand level with increased time.     Vision Baseline Vision/History: Wears glasses Patient Visual Report: No change from baseline       Perception     Praxis      Pertinent Vitals/Pain Pain Assessment: 0-10 Pain Score: 4   Pain Descriptors / Indicators: Aching;Operative site guarding Pain Intervention(s): Monitored during session     Hand Dominance Right   Extremity/Trunk Assessment Upper Extremity Assessment Upper Extremity Assessment: Overall WFL for tasks assessed   Lower Extremity Assessment Lower Extremity Assessment: Overall WFL for tasks assessed   Cervical / Trunk Assessment Cervical / Trunk Assessment: Other exceptions Cervical / Trunk Exceptions: hx of spinal surgeries   Communication Communication Communication: No difficulties   Cognition Arousal/Alertness: Awake/alert Behavior During Therapy: WFL for tasks assessed/performed Overall Cognitive Status: Within Functional Limits for tasks assessed                                     General Comments       Exercises     Shoulder Instructions      Home Living Family/patient expects to be discharged to:: Private residence Living Arrangements: Spouse/significant other;Children Available Help at Discharge: Family;Available 24 hours/day Type of Home: House Home Access: Stairs to enter [employer] of Steps: 3 from the garage   Home  Layout: Able to live on main level with bedroom/bathroom;Multi-level     Bathroom Shower/Tub: Teacher, early years/pre: Standard Bathroom Accessibility: No   Home Equipment: Shower seat          Prior Functioning/Environment Level of Independence: Independent        Comments: continues working as a Actor Problem List: Pain      OT Treatment/Interventions:      OT Goals(Current goals can be found in the care plan section) Acute Rehab OT Goals Patient Stated Goal: hoping to go home this afternoon OT Goal Formulation: With patient Time For Goal Achievement: 10/30/20 Potential to Achieve Goals: Good  OT Frequency:     Barriers to D/C:            Co-evaluation              AM-PAC OT "6 Clicks" Daily Activity     Outcome  Measure Help from another person eating meals?: None Help from another person taking care of personal grooming?: None Help from another person toileting, which includes using toliet, bedpan, or urinal?: None Help from another person bathing (including washing, rinsing, drying)?: None Help from another person to put on and taking off regular upper body clothing?: None Help from another person to put on and taking off regular lower body clothing?: None 6 Click Score: 24   End of Session Nurse Communication: Mobility status  Activity Tolerance: Patient tolerated treatment well Patient left: in bed;with call bell/phone within reach;with family/visitor present  OT Visit Diagnosis: Pain Pain - Right/Left:  (back)                Time: [id number] OT Time Calculation (min): 22 min Charges:  OT General Charges $OT Visit: 1 Visit OT Evaluation $OT Eval Moderate Complexity: 1 Mod  10/30/2020  Rich, OTR/L  Acute Rehabilitation Services  Office:  [phone]   Metta Clines 10/30/2020, 2:36 PM

## 2020-10-30 NOTE — Anesthesia Procedure Notes (Signed)
Procedure Name: Intubation Date/Time: 10/30/2020 7:35 AM Performed by: Michele Rockers, CRNA Pre-anesthesia Checklist: Patient identified, Patient being monitored, Timeout performed, Emergency Drugs available and Suction available Patient Re-evaluated:Patient Re-evaluated prior to induction Oxygen Delivery Method: Circle System Utilized Preoxygenation: Pre-oxygenation with 100% oxygen Induction Type: IV induction Ventilation: Mask ventilation without difficulty Laryngoscope Size: Miller and 2 Grade View: Grade I Tube type: Oral Tube size: 7.5 mm Number of attempts: 1 Airway Equipment and Method: Stylet Placement Confirmation: ETT inserted through vocal cords under direct vision, positive ETCO2 and breath sounds checked- equal and bilateral Secured at: 21 cm Tube secured with: Tape Dental Injury: Teeth and Oropharynx as per pre-operative assessment

## 2020-10-30 NOTE — Progress Notes (Signed)
PT Cancellation Note  Patient Details Name: Ariel Morgan MRN: [id number] DOB: June 24, 1975   Cancelled Treatment:    Reason Eval/Treat Not Completed: PT screened, no needs identified, will sign off Per OT, pt with no skilled PT needs. Will sign off. If needs change, please re-consult.   [name], PT, DPT  Acute Rehabilitation Services  Pager: [phone] Office: [phone]    [name] 10/30/2020, 2:50 PM

## 2020-10-30 NOTE — Transfer of Care (Signed)
Immediate Anesthesia Transfer of Care Note  Patient: Ariel Morgan  Procedure(s) Performed: SPINAL CORD STIMULATOR INSERTION  Patient Location: PACU  Anesthesia Type:General  Level of Consciousness: drowsy, patient cooperative and responds to stimulation  Airway & Oxygen Therapy: Patient Spontanous Breathing and Patient connected to nasal cannula oxygen  Post-op Assessment: Report given to RN, Post -op Vital signs reviewed and stable and Patient moving all extremities X 4  Post vital signs: Reviewed and stable  Last Vitals:  Vitals Value Taken Time  BP 111/62 10/30/20 0949  Temp    Pulse 104 10/30/20 0951  Resp 14 10/30/20 0951  SpO2 100 % 10/30/20 0951  Vitals shown include unvalidated device data.  Last Pain:  Vitals:   10/30/20 0631  TempSrc:   PainSc: 6       Patients Stated Pain Goal: 3 ([id number])  Complications: No notable events documented.

## 2020-10-30 NOTE — Progress Notes (Signed)
Patient was transported via wheelchair by volunteer for discharge home; in no acute distress nor complaints of pain nor discomfort; room was checked and accounted for all her belongings; discharge instructions given to patient by RN and she verbalized understanding on the instructions given.

## 2020-10-30 NOTE — H&P (Signed)
Addendum H&P  There is been no change in the patient's clinical exam since her last office 10/27/2020.  She continues to have significant back and neuropathic leg pain.  Patient had a successful spinal cord stimulator with improvement in her quality of life and pain level.  I have gone over the surgery with her in great detail again including the risks, benefits, and alternatives.  All her questions were encouraged and addressed.

## 2020-10-30 NOTE — Brief Op Note (Signed)
10/30/2020  9:10 AM  PATIENT:  Ariel Morgan  45 y.o. female  PRE-OPERATIVE DIAGNOSIS:  Failed back syndrome  POST-OPERATIVE DIAGNOSIS:  Failed back syndrome  PROCEDURE:  Procedure(s): SPINAL CORD STIMULATOR INSERTION (N/A)  SURGEON:  Surgeon(s) and Role:    Melina Schools, MD - Primary  PHYSICIAN ASSISTANT:   ASSISTANTS: Amanda Ward, PA  ANESTHESIA:   general  EBL:  20 mL   BLOOD ADMINISTERED:none  DRAINS: none   LOCAL MEDICATIONS USED:  MARCAINE     SPECIMEN:  No Specimen  DISPOSITION OF SPECIMEN:  N/A  COUNTS:  YES  TOURNIQUET:  * No tourniquets in log *  DICTATION: .Dragon Dictation  PLAN OF CARE: Admit for overnight observation  PATIENT DISPOSITION:  PACU - hemodynamically stable.

## 2020-10-30 NOTE — Anesthesia Postprocedure Evaluation (Signed)
Anesthesia Post Note  Patient: Ariel Morgan  Procedure(s) Performed: SPINAL CORD STIMULATOR INSERTION     Patient location during evaluation: PACU Anesthesia Type: General Level of consciousness: awake and alert Pain management: pain level controlled Vital Signs Assessment: post-procedure vital signs reviewed and stable Respiratory status: spontaneous breathing, nonlabored ventilation, respiratory function stable and patient connected to nasal cannula oxygen Cardiovascular status: blood pressure returned to baseline and stable Postop Assessment: no apparent nausea or vomiting Anesthetic complications: no   No notable events documented.  Last Vitals:  Vitals:   10/30/20 1115 10/30/20 1149  BP: 112/79 117/82  Pulse: 85 78  Resp: 17 18  Temp: 36.6 C (!) 36.4 C  SpO2: 96% 99%    Last Pain:  Vitals:   10/30/20 1149  TempSrc: Oral  PainSc:                  Effie Berkshire

## 2020-10-30 NOTE — Discharge Instructions (Signed)

## 2020-10-31 ENCOUNTER — Encounter (HOSPITAL_COMMUNITY): Payer: Self-pay | Admitting: Orthopedic Surgery

## 2020-11-07 NOTE — Discharge Summary (Signed)
Patient ID: Ariel Morgan MRN: [id number] DOB/AGE: 1975/04/22 45 y.o.  Admit date: 10/30/2020 Discharge date: 10/30/2020  Admission Diagnoses:  Active Problems:   Chronic pain   Discharge Diagnoses:  Active Problems:   Chronic pain  status post Procedure(s): SPINAL CORD STIMULATOR INSERTION  Past Medical History:  Diagnosis Date   Anemia    Anxiety    Arthritis    Depression    Frequent headaches    H/O   GERD (gastroesophageal reflux disease)    Hypercholesterolemia    Placenta previa     Surgeries: Procedure(s): SPINAL CORD STIMULATOR INSERTION on 10/30/2020   Consultants:   Discharged Condition: Improved  Hospital Course: Ariel Morgan is an 45 y.o. female who was admitted 10/30/2020 for operative treatment of  Failed back syndrome. Patient failed conservative treatments (please see the history and physical for the specifics) and had severe unremitting pain that affects sleep, daily activities and work/hobbies. After pre-op clearance, the patient was taken to the operating room on 10/30/2020 and underwent  Procedure(s): [place].    Patient was given perioperative antibiotics:  Anti-infectives (From admission, onward)    Start     Dose/Rate Route Frequency Ordered Stop   10/30/20 1540  ceFAZolin (ANCEF) IVPB 1 g/50 mL premix  Status:  Discontinued        1 g 100 mL/hr over 30 Minutes Intravenous Every 8 hours 10/30/20 1147 10/31/20 0352   10/30/20 0600  ceFAZolin (ANCEF) IVPB 2g/100 mL premix        2 g 200 mL/hr over 30 Minutes Intravenous 30 min pre-op 10/30/20 0600 10/30/20 0740        Patient was given sequential compression devices and early ambulation to prevent DVT.   Patient benefited maximally from hospital stay and there were no complications. At the time of discharge, the patient was urinating/moving their bowels without difficulty, tolerating a regular diet, pain is controlled with oral pain medications and they  have been cleared by PT/OT.   Recent vital signs: No data found.   Recent laboratory studies: No results for input(s): WBC, HGB, HCT, PLT, NA, K, CL, CO2, BUN, CREATININE, GLUCOSE, INR, CALCIUM in the last 72 hours.  Invalid input(s): PT, 2   Discharge Medications:   Allergies as of 10/30/2020   No Known Allergies      Medication List     STOP taking these medications    propranolol 20 MG tablet Commonly known as: INDERAL       TAKE these medications    amphetamine-dextroamphetamine 30 MG 24 hr capsule Commonly known as: ADDERALL XR Take 30 mg by mouth daily.   amphetamine-dextroamphetamine 10 MG tablet Commonly known as: ADDERALL Take 10 mg by mouth daily as needed (focus).   candesartan 4 MG tablet Commonly known as: ATACAND Take 4 mg by mouth daily.   fexofenadine 60 MG tablet Commonly known as: ALLEGRA Take 60 mg by mouth 2 (two) times daily.   ondansetron 4 MG tablet Commonly known as: Zofran Take 1 tablet (4 mg total) by mouth every 8 (eight) hours as needed for nausea or vomiting.   pregabalin 150 MG capsule Commonly known as: LYRICA Take 150 mg by mouth at bedtime.   pregabalin 50 MG capsule Commonly known as: LYRICA TAKE 1 CAPSULE (50 MG TOTAL) BY MOUTH 2 (TWO) TIMES DAILY.   rizatriptan 10 MG tablet Commonly known as: MAXALT Take 10 mg by mouth daily as needed for migraine.   triamcinolone 0.025 %  cream Commonly known as: KENALOG Apply 1 application topically 2 (two) times daily.   venlafaxine XR 75 MG 24 hr capsule Commonly known as: EFFEXOR-XR Take 75 mg by mouth daily.   zinc gluconate 50 MG tablet Take 50 mg by mouth daily.       ASK your doctor about these medications    methocarbamol 500 MG tablet Commonly known as: Robaxin Take 1 tablet (500 mg total) by mouth every 8 (eight) hours as needed for up to 5 days for muscle spasms. Ask about: Should I take this medication?   oxyCODONE-acetaminophen 10-325 MG tablet Commonly  known as: Percocet Take 1 tablet by mouth every 6 (six) hours as needed for up to 5 days for pain. Ask about: Should I take this medication?   Wegovy 1.7 MG/0.75ML Soaj Generic drug: Semaglutide-Weight Management Inject 1.7 mg into the skin once a week. Ask about: Which instructions should I use?   Wegovy 2.4 MG/0.75ML Soaj Generic drug: Semaglutide-Weight Management Inject 2.4 mg into the skin once a week. Ask about: Which instructions should I use?        Diagnostic Studies: DG Chest 2 View  Result Date: 10/27/2020 CLINICAL DATA:  45 year old female with a history of pending spinal cord stimulator placement EXAM: CHEST - 2 VIEW COMPARISON:  None. FINDINGS: Cardiomediastinal silhouette within normal limits in size and contour. No evidence of central vascular congestion. No interlobular septal thickening. No pneumothorax or pleural effusion. No confluent airspace disease. No acute displaced fracture IMPRESSION: No active cardiopulmonary disease. Electronically Signed   By: Corrie Mckusick D.O.   On: 10/27/2020 12:31   DG THORACOLUMABAR SPINE  Result Date: 10/30/2020 CLINICAL DATA:  Elective surgery Z41.9 (ICD-10-CM). Additional history provided by technologist: Spinal cord stimulator. Provided fluoroscopy time 29 seconds (17.07 mGy). EXAM: THORACOLUMBAR SPINE 1V COMPARISON:  Chest radiographs 10/27/2020. FINDINGS: AP and lateral view intraoperative fluoroscopic images of the thoracolumbar spine are submitted, 4 images total. On the provided images, two spinal cord stimulator leads project in the region of dorsal thoracic spinal canal. Overlying retractors. Correlate with the procedural history. IMPRESSION: Four intraprocedural fluoroscopic images of the thoracolumbar spine from spinal cord stimulator implantation, as described. Electronically Signed   By: Kellie Simmering DO   On: 10/30/2020 09:39   DG C-Arm 1-60 Min  Result Date: 10/30/2020 CLINICAL DATA:  Elective surgery Z41.9 (ICD-10-CM).  Additional history provided by technologist: Spinal cord stimulator. Provided fluoroscopy time 29 seconds (17.07 mGy). EXAM: THORACOLUMBAR SPINE 1V COMPARISON:  Chest radiographs 10/27/2020. FINDINGS: AP and lateral view intraoperative fluoroscopic images of the thoracolumbar spine are submitted, 4 images total. On the provided images, two spinal cord stimulator leads project in the region of dorsal thoracic spinal canal. Overlying retractors. Correlate with the procedural history. IMPRESSION: Four intraprocedural fluoroscopic images of the thoracolumbar spine from spinal cord stimulator implantation, as described. Electronically Signed   By: Kellie Simmering DO   On: 10/30/2020 09:39    Discharge Instructions     Incentive spirometry RT   Complete by: As directed         Follow-up Information     Melina Schools, MD Follow up in 2 week(s).   Specialty: Orthopedic Surgery Why: For suture removal, For wound re-check, If symptoms worsen Contact information: [address] [place] [address] [id number]                 Discharge Plan:  discharge to home  Disposition: stable    Signed: Yvonne Kendall  Odetta Pink for [facility] PA-C [facility] [phone] 11/07/2020, 10:40 AM

## 2020-11-17 ENCOUNTER — Encounter: Payer: BC Managed Care – PPO | Admitting: Internal Medicine

## 2020-12-27 ENCOUNTER — Other Ambulatory Visit: Payer: Self-pay | Admitting: Internal Medicine

## 2020-12-27 DIAGNOSIS — Z713 Dietary counseling and surveillance: Secondary | ICD-10-CM

## 2021-01-06 ENCOUNTER — Other Ambulatory Visit: Payer: Self-pay

## 2021-01-06 ENCOUNTER — Ambulatory Visit
Admission: RE | Admit: 2021-01-06 | Discharge: 2021-01-06 | Disposition: A | Discharge status: Home / Self Care | Payer: BC Managed Care – PPO | Source: Ambulatory Visit | Attending: Emergency Medicine | Admitting: Emergency Medicine

## 2021-01-06 VITALS — BP 116/72 | HR 91 | Temp 98.3°F | Resp 18

## 2021-01-06 DIAGNOSIS — J0100 Acute maxillary sinusitis, unspecified: Secondary | ICD-10-CM

## 2021-01-06 DIAGNOSIS — R051 Acute cough: Secondary | ICD-10-CM | POA: Diagnosis not present

## 2021-01-06 MED ORDER — AMOXICILLIN 875 MG PO TABS: 875.0000 mg | ORAL_TABLET | Freq: Two times a day (BID) | ORAL | 0 refills | Status: AC

## 2021-01-06 MED ORDER — BENZONATATE 100 MG PO CAPS: 100.0000 mg | ORAL_CAPSULE | Freq: Three times a day (TID) | ORAL | 0 refills | Status: DC | PRN

## 2021-01-06 NOTE — ED Triage Notes (Signed)
Pt here with cough and nasal drip and congestion x 5 days.

## 2021-01-06 NOTE — Discharge Instructions (Addendum)
Take the [facility] as needed for cough.  Continue symptomatic treatment as discussed.  If your symptoms are not improving in the next 2 to 3 days, start the amoxicillin as directed.    Follow up with your primary care provider if your symptoms are not improving.

## 2021-01-06 NOTE — ED Provider Notes (Signed)
Ariel Morgan    CSN: [id number] Arrival date & time: 01/06/21  1532      History   Chief Complaint Chief Complaint  Patient presents with   Cough   Nasal Congestion     HPI Ariel Morgan is a 45 y.o. female.  Patient presents with 5-day history of sinus pressure, congestion, postnasal drip, cough.  She states her symptoms are getting worse.  Treatment attempted at home with DayQuil and NyQuil.  She denies fever, chills, shortness of breath, or other symptoms.  She declines COVID test.  Her medical history includes GERD, hypercholesterolemia, low back pain, chronic pain, anxiety, frequent headaches, depression, anemia.  The history is provided by the patient and medical records.   Past Medical History:  Diagnosis Date   Anemia    Anxiety    Arthritis    Depression    Frequent headaches    H/O   GERD (gastroesophageal reflux disease)    Hypercholesterolemia    Placenta previa     Patient Active Problem List   Diagnosis Date Noted   Chronic pain 10/30/2020   Breast cancer screening 10/24/2020   Cough 06/22/2020   Hair loss 06/22/2020   S/P laparoscopic hysterectomy 09/19/2019   Fibroid 05/28/2019   RLQ abdominal pain 05/28/2019   Leg pain 02/25/2019   Weight loss 11/19/2018   Surgery, elective    Hyperlipidemia    Anxiety state    Acute blood loss anemia    Post-operative pain    FUO (fever of unknown origin)    S/P lumbar fusion 12/28/2017   Pre-op evaluation 12/11/2017   Abnormal mammogram 07/23/2017   Microcalcification of right breast on mammogram 05/04/2017   Low back pain 06/17/2015   Health care maintenance 03/15/2015   Herpes zoster 09/16/2014   GAD (generalized anxiety disorder) 12/03/2012   GERD (gastroesophageal reflux disease) 12/03/2012   Headache 12/03/2012   Hypercholesterolemia 12/03/2012    Past Surgical History:  Procedure Laterality Date   ABDOMINAL EXPOSURE N/A 01/01/2018   Procedure: ABDOMINAL EXPOSURE;  Surgeon:  Marty Heck, MD;  Location: [facility];  Service: Vascular;  Laterality: N/A;   ABDOMINAL HYSTERECTOMY     ANTERIOR LUMBAR FUSION N/A 01/01/2018   Procedure: ANTERIOR LUMBAR FUSION L5-S1; REPLACEMENT OF posterior HARDWARE L4-S1;  Surgeon: Melina Schools, MD;  Location: [facility];  Service: Orthopedics;  Laterality: N/A;   BACK SURGERY     BREAST BIOPSY Right 05/10/2017   right breast stereotatic bx path pending   CESAREAN SECTION  2003 & 2010   CHOLECYSTECTOMY  2005   CYSTOSCOPY  09/11/2019   Procedure: CYSTOSCOPY;  Surgeon: Gae Dry, MD;  Location: [facility];  Service: Gynecology;;   LUMBAR LAMINECTOMY/DECOMPRESSION MICRODISCECTOMY N/A 12/28/2017   Procedure: L4-S1 decompression and fusion;  Surgeon: Melina Schools, MD;  Location: [facility];  Service: Orthopedics;  Laterality: N/A;  4.5 hrs   SPINAL CORD STIMULATOR INSERTION N/A 10/30/2020   Procedure: SPINAL CORD STIMULATOR INSERTION;  Surgeon: Melina Schools, MD;  Location: [facility];  Service: Orthopedics;  Laterality: N/A;   TOTAL LAPAROSCOPIC HYSTERECTOMY WITH SALPINGECTOMY Bilateral 09/11/2019   Procedure: TOTAL LAPAROSCOPIC HYSTERECTOMY WITH SALPINGECTOMY;  Surgeon: Gae Dry, MD;  Location: [facility];  Service: Gynecology;  Laterality: Bilateral;    OB History     Gravida  3   Para  3   Term  1   Preterm  2   AB      Living  3  SAB      IAB      Ectopic      Multiple      Live Births  3        Obstetric Comments  1st Menstrual Cycle:  9 1st Pregnancy: 26           Home Medications    Prior to Admission medications   Medication Sig Start Date End Date Taking? Authorizing Provider  amoxicillin (AMOXIL) 875 MG tablet Take 1 tablet (875 mg total) by mouth 2 (two) times daily for 7 days. 01/06/21 01/13/21 Yes Sharion Balloon, NP  benzonatate (TESSALON) 100 MG capsule Take 1 capsule (100 mg total) by mouth 3 (three) times daily as needed for cough. 01/06/21  Yes Sharion Balloon, NP   amphetamine-dextroamphetamine (ADDERALL XR) 30 MG 24 hr capsule Take 30 mg by mouth daily. 05/24/20   [provider]  amphetamine-dextroamphetamine (ADDERALL) 10 MG tablet Take 10 mg by mouth daily as needed (focus). 10/25/20   [provider]  candesartan (ATACAND) 4 MG tablet Take 4 mg by mouth daily. 10/19/20   [provider]  fexofenadine (ALLEGRA) 60 MG tablet Take 60 mg by mouth 2 (two) times daily.     [provider]  ondansetron (ZOFRAN) 4 MG tablet Take 1 tablet (4 mg total) by mouth every 8 (eight) hours as needed for nausea or vomiting. 10/30/20   Melina Schools, MD  pregabalin (LYRICA) 150 MG capsule Take 150 mg by mouth at bedtime.    [provider]  pregabalin (LYRICA) 50 MG capsule TAKE 1 CAPSULE (50 MG TOTAL) BY MOUTH 2 (TWO) TIMES DAILY. 03/07/20   [name], MD  rizatriptan (MAXALT) 10 MG tablet Take 10 mg by mouth daily as needed for migraine. 02/26/20   [provider]  Semaglutide-Weight Management (WEGOVY) 1.7 MG/0.75ML SOAJ Inject 1.7 mg into the skin once a week. Patient not taking: Reported on 10/30/2020 06/24/20   [name], MD  triamcinolone (KENALOG) 0.025 % cream Apply 1 application topically 2 (two) times daily. 07/30/20   [provider]  venlafaxine XR (EFFEXOR-XR) 75 MG 24 hr capsule Take 75 mg by mouth daily. 03/09/20   [provider]  WEGOVY 2.4 MG/0.75ML SOAJ INJECT 2.4 MG INTO THE SKIN ONCE A WEEK FOR 28 DAYS. 12/29/20   [name], MD  zinc gluconate 50 MG tablet Take 50 mg by mouth daily.     [provider]    Family History Family History  Problem Relation Age of Onset   Hyperlipidemia Father    Diabetes Sister    Diabetes Maternal Grandmother    Cancer Maternal Grandmother        vaginal   Cirrhosis Maternal Grandmother    Colon polyps Maternal Grandmother    Breast cancer Maternal Grandmother        Early 7's    Social History Social History    Tobacco Use   Smoking status: Former    Packs/day: 1.00    Types: Cigarettes   Smokeless tobacco: Never  Vaping Use   Vaping Use: Never used  Substance Use Topics   Alcohol use: Not Currently    Comment: rarely   Drug use: No     Allergies   Patient has no known allergies.   Review of Systems Review of Systems  Constitutional:  Negative for chills and fever.  HENT:  Positive for congestion, postnasal drip and sinus pressure. Negative for ear pain and sore throat.  Respiratory:  Positive for cough. Negative for shortness of breath.   Cardiovascular:  Negative for chest pain and palpitations.  Gastrointestinal:  Negative for abdominal pain and vomiting.  Skin:  Negative for color change and rash.  All other systems reviewed and are negative.   Physical Exam Triage Vital Signs ED Triage Vitals  Enc Vitals Group     BP      Pulse      Resp      Temp      Temp src      SpO2      Weight      Height      Head Circumference      Peak Flow      Pain Score      Pain Loc      Pain Edu?      Excl. in [place]?    No data found.  Updated Vital Signs BP 116/72 (BP Location: Left Arm)   Pulse 91   Temp 98.3 F (36.8 C) (Oral)   Resp 18   SpO2 98%   Visual Acuity Right Eye Distance:   Left Eye Distance:   Bilateral Distance:    Right Eye Near:   Left Eye Near:    Bilateral Near:     Physical Exam Vitals and nursing note reviewed.  Constitutional:      General: She is not in acute distress.    Appearance: She is well-developed.  HENT:     Head: Normocephalic and atraumatic.     Right Ear: Tympanic membrane normal.     Left Ear: Tympanic membrane normal.     Nose: Congestion present.     Mouth/Throat:     Mouth: Mucous membranes are moist.     Pharynx: Oropharynx is clear.  Eyes:     Conjunctiva/sclera: Conjunctivae normal.  Cardiovascular:     Rate and Rhythm: Normal rate and regular rhythm.     Heart sounds: Normal heart sounds.  Pulmonary:      Effort: Pulmonary effort is normal. No respiratory distress.     Breath sounds: Normal breath sounds.  Abdominal:     Palpations: Abdomen is soft.     Tenderness: There is no abdominal tenderness.  Musculoskeletal:     Cervical back: Neck supple.  Skin:    General: Skin is warm and dry.  Neurological:     General: No focal deficit present.     Mental Status: She is alert and oriented to person, place, and time.     Gait: Gait normal.  Psychiatric:        Mood and Affect: Mood normal.        Behavior: Behavior normal.     UC Treatments / Results  Labs (all labs ordered are listed, but only abnormal results are displayed) Labs Reviewed - No data to display  EKG   Radiology No results found.  Procedures Procedures (including critical care time)  Medications Ordered in UC Medications - No data to display  Initial Impression / Assessment and Plan / UC Course  I have reviewed the triage vital signs and the nursing notes.  Pertinent labs & imaging results that were available during my care of the patient were reviewed by me and considered in my medical decision making (see chart for details).  Acute sinusitis, cough.  Treating with Tessalon Perles.  Instructed patient to continue symptomatic treatment.  Discussed that if her symptoms are not improving in the next 2 to 3  days, she can start amoxicillin.  Education provided on sinusitis.  Instructed patient to follow-up with her PCP as needed.  She agrees to plan of care.   Final Clinical Impressions(s) / UC Diagnoses   Final diagnoses:  Acute non-recurrent maxillary sinusitis  Acute cough     Discharge Instructions      Take the Tessalon Perles as needed for cough.  Continue symptomatic treatment as discussed.  If your symptoms are not improving in the next 2 to 3 days, start the amoxicillin as directed.    Follow up with your primary care provider if your symptoms are not improving.         ED Prescriptions      Medication Sig Dispense Auth. Provider   benzonatate (TESSALON) 100 MG capsule Take 1 capsule (100 mg total) by mouth 3 (three) times daily as needed for cough. 21 capsule Sharion Balloon, NP   amoxicillin (AMOXIL) 875 MG tablet Take 1 tablet (875 mg total) by mouth 2 (two) times daily for 7 days. 14 tablet Sharion Balloon, NP      PDMP not reviewed this encounter.   Sharion Balloon, NP 01/06/21 [phone]

## 2021-01-13 ENCOUNTER — Encounter: Payer: BC Managed Care – PPO | Admitting: Internal Medicine

## 2021-03-18 ENCOUNTER — Encounter: Payer: Self-pay | Admitting: Internal Medicine

## 2021-03-20 NOTE — Telephone Encounter (Signed)
Please confirm doing ok from surgery. Please confirm did ok with wegovy.  If so, then ok to send in wegovy (.25mg  dose)

## 2021-03-25 ENCOUNTER — Other Ambulatory Visit: Payer: Self-pay

## 2021-03-25 MED ORDER — WEGOVY 0.25 MG/0.5ML ~~LOC~~ SOAJ: 0.2500 mg | SUBCUTANEOUS | 0 refills | Status: DC

## 2021-03-25 NOTE — Telephone Encounter (Signed)
Confirmed doing ok. [facility] sent in.

## 2021-04-16 ENCOUNTER — Encounter: Payer: Self-pay | Admitting: Internal Medicine

## 2021-04-17 ENCOUNTER — Other Ambulatory Visit: Payer: Self-pay

## 2021-04-17 ENCOUNTER — Ambulatory Visit: Payer: BC Managed Care – PPO | Admitting: Family Medicine

## 2021-04-17 ENCOUNTER — Encounter: Payer: Self-pay | Admitting: Family Medicine

## 2021-04-17 VITALS — BP 100/70 | HR 89 | Temp 98.2°F | Ht 62.0 in | Wt 157.5 lb

## 2021-04-17 DIAGNOSIS — R42 Dizziness and giddiness: Secondary | ICD-10-CM | POA: Insufficient documentation

## 2021-04-17 NOTE — Progress Notes (Signed)
Patient ID: Ariel Morgan, female    DOB: 1976-02-05, 46 y.o.   MRN: [id number]  This visit was conducted in person.  BP 100/70    Pulse 89    Temp 98.2 F (36.8 C) (Temporal)    Ht 5\' 2"  (1.575 m)    Wt 157 lb 8 oz (71.4 kg)    SpO2 98%    BMI 28.81 kg/m    CC:  Chief Complaint  Patient presents with   Dizziness    Started last Tuesday    Subjective:   HPI: Ariel Morgan is a 46 y.o. female  patient of Dr. Bary Leriche with GAD,  history of anemia presenting on 04/17/2021 for Dizziness (Started last Tuesday)  She reports she was doing well until  1/2-06/2021 .Marland Kitchen was noting intermittent dizziness.Marland Kitchen describes as occ like room spinning or cloudy in head.  Since 1/10 it has been constant.  Not presyncopal. No recent cold, no ear pressure.  No N/V  Has dull headache. No new numbness, no tingling.  No SOB, no CP. Symptoms are worse with leaning back or forward. No head injury, no falls.  Has been back to school with more stress since christmas break.  Seems to be worse with stress.    No med changes  No blood loss. Gave blood Sat before christmas.   BP Readings from Last 3 Encounters:  04/17/21 100/70  01/06/21 116/72  10/30/20 117/82     Relevant past medical, surgical, family and social history reviewed and updated as indicated. Interim medical history since our last visit reviewed. Allergies and medications reviewed and updated. Outpatient Medications Prior to Visit  Medication Sig Dispense Refill   amphetamine-dextroamphetamine (ADDERALL XR) 30 MG 24 hr capsule Take 30 mg by mouth daily.     amphetamine-dextroamphetamine (ADDERALL) 10 MG tablet Take 10 mg by mouth daily as needed (focus).     fexofenadine (ALLEGRA) 60 MG tablet Take 60 mg by mouth 2 (two) times daily.      LYSINE PO Take 1 tablet by mouth daily.     MAGNESIUM PO Take 1 tablet by mouth daily.     pregabalin (LYRICA) 150 MG capsule Take 150 mg by mouth at bedtime.     pregabalin (LYRICA) 50 MG  capsule TAKE 1 CAPSULE (50 MG TOTAL) BY MOUTH 2 (TWO) TIMES DAILY. 60 capsule 1   rizatriptan (MAXALT) 10 MG tablet Take 10 mg by mouth daily as needed for migraine.     Semaglutide-Weight Management (WEGOVY) 0.25 MG/0.5ML SOAJ Inject 0.25 mg into the skin once a week. 2 mL 0   triamcinolone (KENALOG) 0.025 % cream Apply 1 application topically 2 (two) times daily.     venlafaxine XR (EFFEXOR-XR) 75 MG 24 hr capsule Take 75 mg by mouth daily.     zinc gluconate 50 MG tablet Take 50 mg by mouth daily.      benzonatate (TESSALON) 100 MG capsule Take 1 capsule (100 mg total) by mouth 3 (three) times daily as needed for cough. 21 capsule 0   candesartan (ATACAND) 4 MG tablet Take 4 mg by mouth daily.     ondansetron (ZOFRAN) 4 MG tablet Take 1 tablet (4 mg total) by mouth every 8 (eight) hours as needed for nausea or vomiting. 20 tablet 0   No facility-administered medications prior to visit.     Per HPI unless specifically indicated in ROS section below Review of Systems  Constitutional:  Negative for fatigue and fever.  HENT:  Negative for congestion.   Eyes:  Negative for pain.  Respiratory:  Negative for cough and shortness of breath.   Cardiovascular:  Negative for chest pain, palpitations and leg swelling.  Gastrointestinal:  Negative for abdominal pain.  Genitourinary:  Negative for dysuria and vaginal bleeding.  Musculoskeletal:  Negative for back pain.  Neurological:  Negative for syncope, light-headedness and headaches.  Psychiatric/Behavioral:  Negative for dysphoric mood.   Objective:  BP 100/70    Pulse 89    Temp 98.2 F (36.8 C) (Temporal)    Ht 5\' 2"  (1.575 m)    Wt 157 lb 8 oz (71.4 kg)    SpO2 98%    BMI 28.81 kg/m   Wt Readings from Last 3 Encounters:  04/17/21 157 lb 8 oz (71.4 kg)  10/27/20 155 lb 6.4 oz (70.5 kg)  10/22/20 155 lb 12.8 oz (70.7 kg)      Physical Exam Constitutional:      General: She is not in acute distress.    Appearance: Normal appearance.  She is well-developed. She is not ill-appearing or toxic-appearing.  HENT:     Head: Normocephalic.     Right Ear: Hearing, tympanic membrane, ear canal and external ear normal. Tympanic membrane is not erythematous, retracted or bulging.     Left Ear: Hearing, tympanic membrane, ear canal and external ear normal. Tympanic membrane is not erythematous, retracted or bulging.     Nose: No mucosal edema or rhinorrhea.     Right Sinus: No maxillary sinus tenderness or frontal sinus tenderness.     Left Sinus: No maxillary sinus tenderness or frontal sinus tenderness.     Mouth/Throat:     Pharynx: Uvula midline.  Eyes:     General: Lids are normal. Lids are everted, no foreign bodies appreciated.     Conjunctiva/sclera: Conjunctivae normal.     Pupils: Pupils are equal, round, and reactive to light.  Neck:     Thyroid: No thyroid mass or thyromegaly.     Vascular: No carotid bruit.     Trachea: Trachea normal.  Cardiovascular:     Rate and Rhythm: Normal rate and regular rhythm.     Pulses: Normal pulses.     Heart sounds: Normal heart sounds, S1 normal and S2 normal. No murmur heard.   No friction rub. No gallop.  Pulmonary:     Effort: Pulmonary effort is normal. No tachypnea or respiratory distress.     Breath sounds: Normal breath sounds. No decreased breath sounds, wheezing, rhonchi or rales.  Abdominal:     General: Bowel sounds are normal.     Palpations: Abdomen is soft.     Tenderness: There is no abdominal tenderness.  Musculoskeletal:     Cervical back: Normal range of motion and neck supple.  Skin:    General: Skin is warm and dry.     Findings: No rash.  Neurological:     Mental Status: She is alert and oriented to person, place, and time.     GCS: GCS eye subscore is 4. GCS verbal subscore is 5. GCS motor subscore is 6.     Cranial Nerves: No cranial nerve deficit.     Sensory: No sensory deficit.     Motor: No abnormal muscle tone.     Coordination: Coordination  normal.     Gait: Gait normal.     Deep Tendon Reflexes: Reflexes are normal and symmetric.     Comments: Nml cerebellar exam   No papilledema  Psychiatric:        Mood and Affect: Mood is not anxious or depressed.        Speech: Speech normal.        Behavior: Behavior normal. Behavior is cooperative.        Thought Content: Thought content normal.        Cognition and Memory: Memory is not impaired. She does not exhibit impaired recent memory or impaired remote memory.        Judgment: Judgment normal.      Results for orders placed or performed during the hospital encounter of 10/27/20  Surgical pcr screen   Specimen: Nasal Mucosa; Nasal Swab  Result Value Ref Range   MRSA, PCR NEGATIVE NEGATIVE   Staphylococcus aureus NEGATIVE NEGATIVE  SARS CORONAVIRUS 2 (TAT 6-24 HRS) Nasopharyngeal Nasopharyngeal Swab   Specimen: Nasopharyngeal Swab  Result Value Ref Range   SARS Coronavirus 2 NEGATIVE NEGATIVE  CBC  Result Value Ref Range   WBC 7.2 4.0 - 10.5 K/uL   RBC 4.74 3.87 - 5.11 MIL/uL   Hemoglobin 13.9 12.0 - 15.0 g/dL   HCT 42.0 36.0 - 46.0 %   MCV 88.6 80.0 - 100.0 fL   MCH 29.3 26.0 - 34.0 pg   MCHC 33.1 30.0 - 36.0 g/dL   RDW 12.1 11.5 - 15.5 %   Platelets 328 150 - 400 K/uL   nRBC 0.0 0.0 - 0.2 %  Basic metabolic panel  Result Value Ref Range   Sodium 136 135 - 145 mmol/L   Potassium 3.6 3.5 - 5.1 mmol/L   Chloride 103 98 - 111 mmol/L   CO2 24 22 - 32 mmol/L   Glucose, Bld 85 70 - 99 mg/dL   BUN 5 (L) 6 - 20 mg/dL   Creatinine, Ser 0.67 0.44 - 1.00 mg/dL   Calcium 9.2 8.9 - 10.3 mg/dL   GFR, Estimated >60 >60 mL/min   Anion gap 9 5 - 15  Protime-INR  Result Value Ref Range   Prothrombin Time 12.5 11.4 - 15.2 seconds   INR 0.9 0.8 - 1.2  APTT  Result Value Ref Range   aPTT 27 24 - 36 seconds  Urinalysis, Routine w reflex microscopic Urine, Clean Catch  Result Value Ref Range   Color, Urine YELLOW YELLOW   APPearance CLEAR CLEAR   Specific Gravity, Urine  1.010 1.005 - 1.030   pH 5.0 5.0 - 8.0   Glucose, UA NEGATIVE NEGATIVE mg/dL   Hgb urine dipstick NEGATIVE NEGATIVE   Bilirubin Urine NEGATIVE NEGATIVE   Ketones, ur NEGATIVE NEGATIVE mg/dL   Protein, ur NEGATIVE NEGATIVE mg/dL   Nitrite NEGATIVE NEGATIVE   Leukocytes,Ua NEGATIVE NEGATIVE    This visit occurred during the SARS-CoV-2 public health emergency.  Safety protocols were in place, including screening questions prior to the visit, additional usage of staff PPE, and extensive cleaning of exam room while observing appropriate contact time as indicated for disinfecting solutions.   COVID 19 screen:  No recent travel or known exposure to COVID19 The patient denies respiratory symptoms of COVID 19 at this time. The importance of social distancing was discussed today.   Assessment and Plan Problem List Items Addressed This Visit     Lightheadedness - Primary    Pt having diffiuclty specifing if more vertigo like or lightheaded ( validating both complaints).. will eval with labs but given small fluid behind left ear drum.. treat with flonase.  Start home BPPV desensitization as symptoms sound more like  vertigo than presyncope.      Orders Placed This Encounter  Procedures   Comprehensive metabolic panel   CBC with Differential/Platelet   Vitamin B12   TSH       Eliezer Lofts, MD

## 2021-04-17 NOTE — Assessment & Plan Note (Signed)
Pt having diffiuclty specifing if more vertigo like or lightheaded ( validating both complaints).. will eval with labs but given small fluid behind left ear drum.. treat with flonase.  Start home BPPV desensitization as symptoms sound more like vertigo than presyncope.

## 2021-04-17 NOTE — Patient Instructions (Addendum)
Start home vertigo exercises.   Work on stress reduction, keep up with water intake. Look to MyChart for lab results.  If not improving follow up with PCP in 2 weeks.

## 2021-04-17 NOTE — Addendum Note (Signed)
Addended by: Ellamae Sia on: 04/17/2021 03:50 PM   Modules accepted: Orders

## 2021-04-18 LAB — CBC WITH DIFFERENTIAL/PLATELET
Absolute Monocytes: 523 cells/uL (ref 200–950)
Basophils Absolute: 156 cells/uL (ref 0–200)
Basophils Relative: 2 %
Eosinophils Absolute: 211 cells/uL (ref 15–500)
Eosinophils Relative: 2.7 %
HCT: 39.7 % (ref 35.0–45.0)
Hemoglobin: 13.1 g/dL (ref 11.7–15.5)
Lymphs Abs: 2769 cells/uL (ref 850–3900)
MCH: 29.6 pg (ref 27.0–33.0)
MCHC: 33 g/dL (ref 32.0–36.0)
MCV: 89.6 fL (ref 80.0–100.0)
MPV: 9.5 fL (ref 7.5–12.5)
Monocytes Relative: 6.7 %
Neutro Abs: 4142 cells/uL (ref 1500–7800)
Neutrophils Relative %: 53.1 %
Platelets: 370 10*3/uL (ref 140–400)
RBC: 4.43 10*6/uL (ref 3.80–5.10)
RDW: 12.3 % (ref 11.0–15.0)
Total Lymphocyte: 35.5 %
WBC: 7.8 10*3/uL (ref 3.8–10.8)

## 2021-04-18 LAB — COMPREHENSIVE METABOLIC PANEL
AG Ratio: 1.7 (calc) (ref 1.0–2.5)
ALT: 13 U/L (ref 6–29)
AST: 16 U/L (ref 10–35)
Albumin: 4.3 g/dL (ref 3.6–5.1)
Alkaline phosphatase (APISO): 70 U/L (ref 31–125)
BUN: 13 mg/dL (ref 7–25)
CO2: 29 mmol/L (ref 20–32)
Calcium: 10 mg/dL (ref 8.6–10.2)
Chloride: 105 mmol/L (ref 98–110)
Creat: 0.76 mg/dL (ref 0.50–0.99)
Globulin: 2.6 g/dL (calc) (ref 1.9–3.7)
Glucose, Bld: 88 mg/dL (ref 65–99)
Potassium: 4.5 mmol/L (ref 3.5–5.3)
Sodium: 141 mmol/L (ref 135–146)
Total Bilirubin: 0.6 mg/dL (ref 0.2–1.2)
Total Protein: 6.9 g/dL (ref 6.1–8.1)

## 2021-04-18 LAB — TSH: TSH: 1.46 mIU/L

## 2021-04-18 LAB — VITAMIN B12: Vitamin B-12: 432 pg/mL (ref 200–1100)

## 2021-04-23 ENCOUNTER — Ambulatory Visit (INDEPENDENT_AMBULATORY_CARE_PROVIDER_SITE_OTHER): Payer: BC Managed Care – PPO | Admitting: Internal Medicine

## 2021-04-23 ENCOUNTER — Encounter: Payer: Self-pay | Admitting: Internal Medicine

## 2021-04-23 ENCOUNTER — Other Ambulatory Visit: Payer: Self-pay

## 2021-04-23 VITALS — BP 120/78 | HR 98 | Temp 97.0°F | Ht 62.0 in | Wt 161.8 lb

## 2021-04-23 DIAGNOSIS — Z0000 Encounter for general adult medical examination without abnormal findings: Secondary | ICD-10-CM | POA: Diagnosis not present

## 2021-04-23 DIAGNOSIS — F411 Generalized anxiety disorder: Secondary | ICD-10-CM | POA: Diagnosis not present

## 2021-04-23 DIAGNOSIS — R42 Dizziness and giddiness: Secondary | ICD-10-CM | POA: Diagnosis not present

## 2021-04-23 DIAGNOSIS — E7800 Pure hypercholesterolemia, unspecified: Secondary | ICD-10-CM | POA: Diagnosis not present

## 2021-04-23 MED ORDER — MECLIZINE HCL 12.5 MG PO TABS: 12.5000 mg | ORAL_TABLET | Freq: Two times a day (BID) | ORAL | 0 refills | Status: DC | PRN

## 2021-04-23 NOTE — Progress Notes (Signed)
Patient ID: Ariel Morgan, female   DOB: 1975/08/13, 46 y.o.   MRN: [id number]   Subjective:    Patient ID: Ariel Morgan, female    DOB: 04/23/1975, 46 y.o.   MRN: [id number]  This visit occurred during the SARS-CoV-2 public health emergency.  Safety protocols were in place, including screening questions prior to the visit, additional usage of staff PPE, and extensive cleaning of exam room while observing appropriate contact time as indicated for disinfecting solutions.   Patient here for her physical exam.   Chief Complaint  Patient presents with   Annual Exam    Dizziness   .   HPI States went to beach.  Noticed a different sensation on the way home, but no significant problems.  Thought was more related to some motion sickness - riding.  Symptoms started 04/06/21 and some worsening the following day.  The following week, symptoms progressed and have continued.  Was evaluated 04/17/21 - Dr Diona Browner.  Labs unrevealing.  Started on flonase - small amount of fluid behind left ear drum.  Comes in today with persistent symptoms.  No sinus congestion.  Dull "headache" - frontal. No severe headache.  No vision change.  She is using flonase and taking allergy medication.  Worsens with certain position changes.  No chest pain or sob.  No increased heart rate or palpitations.  Eating.  Staying hydrated.  No vomiting.  Back on wegovy.  Has been to the dentist a couple of times since started, but reports was present prior to dentist and prior to getting tatoo.  Increased stress.  Discussed. Seeing Dr Nicolasa Ducking.    Past Medical History:  Diagnosis Date   Anemia    Anxiety    Arthritis    Depression    Frequent headaches    H/O   GERD (gastroesophageal reflux disease)    Hypercholesterolemia    Placenta previa    Past Surgical History:  Procedure Laterality Date   ABDOMINAL EXPOSURE N/A 01/01/2018   Procedure: ABDOMINAL EXPOSURE;  Surgeon: Marty Heck, MD;  Location: [facility];  Service:  Vascular;  Laterality: N/A;   ABDOMINAL HYSTERECTOMY     ANTERIOR LUMBAR FUSION N/A 01/01/2018   Procedure: ANTERIOR LUMBAR FUSION L5-S1; REPLACEMENT OF posterior HARDWARE L4-S1;  Surgeon: Melina Schools, MD;  Location: [place];  Service: Orthopedics;  Laterality: N/A;   BACK SURGERY     BREAST BIOPSY Right 05/10/2017   right breast stereotatic bx path pending   CESAREAN SECTION  2003 & 2010   CHOLECYSTECTOMY  2005   CYSTOSCOPY  09/11/2019   Procedure: CYSTOSCOPY;  Surgeon: Gae Dry, MD;  Location: [facility];  Service: Gynecology;;   LUMBAR LAMINECTOMY/DECOMPRESSION MICRODISCECTOMY N/A 12/28/2017   Procedure: L4-S1 decompression and fusion;  Surgeon: Melina Schools, MD;  Location: [place];  Service: Orthopedics;  Laterality: N/A;  4.5 hrs   SPINAL CORD STIMULATOR INSERTION N/A 10/30/2020   Procedure: SPINAL CORD STIMULATOR INSERTION;  Surgeon: Melina Schools, MD;  Location: [place];  Service: Orthopedics;  Laterality: N/A;   TOTAL LAPAROSCOPIC HYSTERECTOMY WITH SALPINGECTOMY Bilateral 09/11/2019   Procedure: TOTAL LAPAROSCOPIC HYSTERECTOMY WITH SALPINGECTOMY;  Surgeon: Gae Dry, MD;  Location: [facility];  Service: Gynecology;  Laterality: Bilateral;   Family History  Problem Relation Age of Onset   Hyperlipidemia Father    Diabetes Sister    Diabetes Maternal Grandmother    Cancer Maternal Grandmother        vaginal   Cirrhosis Maternal Grandmother  Colon polyps Maternal Grandmother    Breast cancer Maternal Grandmother        Early 41's   Social History   Socioeconomic History   Marital status: Married    Spouse name: Not on file   Number of children: 3   Years of education: Not on file   Highest education level: Not on file  Occupational History   Not on file  Tobacco Use   Smoking status: Former    Packs/day: 1.00    Types: Cigarettes   Smokeless tobacco: Never  Vaping Use   Vaping Use: Never used  Substance and Sexual Activity   Alcohol use: Not Currently     Comment: rarely   Drug use: No   Sexual activity: Not on file  Other Topics Concern   Not on file  Social History Narrative   Not on file   Social Determinants of Health   Financial Resource Strain: Low Risk    Difficulty of Paying Living Expenses: Not hard at all  Food Insecurity: Not on file  Transportation Needs: Not on file  Physical Activity: Not on file  Stress: Not on file  Social Connections: Not on file     Review of Systems  Constitutional:  Negative for appetite change and unexpected weight change.  HENT:  Negative for congestion, sinus pressure and sore throat.   Eyes:  Negative for pain and visual disturbance.  Respiratory:  Negative for cough, chest tightness and shortness of breath.   Cardiovascular:  Negative for chest pain, palpitations and leg swelling.  Gastrointestinal:  Negative for abdominal pain, diarrhea, nausea and vomiting.  Genitourinary:  Negative for difficulty urinating and dysuria.  Musculoskeletal:  Negative for joint swelling and myalgias.  Skin:  Negative for color change and rash.  Neurological:  Positive for dizziness, light-headedness and headaches.  Hematological:  Negative for adenopathy. Does not bruise/bleed easily.  Psychiatric/Behavioral:  Negative for agitation and dysphoric mood.       Objective:     BP 120/78    Pulse 98    Temp (!) 97 F (36.1 C) (Oral)    Ht 5\' 2"  (1.575 m)    Wt 161 lb 12.8 oz (73.4 kg)    SpO2 97%    BMI 29.59 kg/m  Wt Readings from Last 3 Encounters:  04/23/21 161 lb 12.8 oz (73.4 kg)  04/17/21 157 lb 8 oz (71.4 kg)  10/27/20 155 lb 6.4 oz (70.5 kg)    Physical Exam Vitals reviewed.  Constitutional:      General: She is not in acute distress.    Appearance: Normal appearance. She is well-developed.  HENT:     Head: Normocephalic and atraumatic.     Right Ear: External ear normal.     Left Ear: External ear normal.  Eyes:     General: No scleral icterus.       Right eye: No discharge.         Left eye: No discharge.     Conjunctiva/sclera: Conjunctivae normal.  Neck:     Thyroid: No thyromegaly.  Cardiovascular:     Rate and Rhythm: Normal rate and regular rhythm.  Pulmonary:     Effort: No tachypnea, accessory muscle usage or respiratory distress.     Breath sounds: Normal breath sounds. No decreased breath sounds or wheezing.  Chest:  Breasts:    Right: No inverted nipple, mass, nipple discharge or tenderness (no axillary adenopathy).     Left: No inverted nipple, mass, nipple  discharge or tenderness (no axilarry adenopathy).  Abdominal:     General: Bowel sounds are normal.     Palpations: Abdomen is soft.     Tenderness: There is no abdominal tenderness.  Musculoskeletal:        General: No swelling or tenderness.     Cervical back: Neck supple.  Lymphadenopathy:     Cervical: No cervical adenopathy.  Skin:    Findings: No erythema or rash.  Neurological:     Mental Status: She is alert and oriented to person, place, and time.     Comments: Some reproducible symptoms with certain movements.    Psychiatric:        Mood and Affect: Mood normal.        Behavior: Behavior normal.     Outpatient Encounter Medications as of 04/23/2021  Medication Sig   amphetamine-dextroamphetamine (ADDERALL XR) 30 MG 24 hr capsule Take 30 mg by mouth daily.   amphetamine-dextroamphetamine (ADDERALL) 10 MG tablet Take 10 mg by mouth daily as needed (focus).   candesartan (ATACAND) 4 MG tablet Take 4 mg by mouth daily.   EMGALITY 120 MG/ML SOAJ Inject into the skin.   fexofenadine (ALLEGRA) 60 MG tablet Take 60 mg by mouth 2 (two) times daily.    LYSINE PO Take 1 tablet by mouth daily.   MAGNESIUM PO Take 1 tablet by mouth daily.   meclizine (ANTIVERT) 12.5 MG tablet Take 1 tablet (12.5 mg total) by mouth 2 (two) times daily as needed for dizziness.   pregabalin (LYRICA) 150 MG capsule Take 150 mg by mouth at bedtime.   pregabalin (LYRICA) 50 MG capsule TAKE 1 CAPSULE (50 MG  TOTAL) BY MOUTH 2 (TWO) TIMES DAILY.   rizatriptan (MAXALT) 10 MG tablet Take 10 mg by mouth daily as needed for migraine.   Semaglutide-Weight Management (WEGOVY) 0.25 MG/0.5ML SOAJ Inject 0.25 mg into the skin once a week.   triamcinolone (KENALOG) 0.025 % cream Apply 1 application topically 2 (two) times daily.   venlafaxine XR (EFFEXOR-XR) 75 MG 24 hr capsule Take 75 mg by mouth daily.   zinc gluconate 50 MG tablet Take 50 mg by mouth daily.    No facility-administered encounter medications on file as of 04/23/2021.     Lab Results  Component Value Date   WBC 7.8 04/17/2021   HGB 13.1 04/17/2021   HCT 39.7 04/17/2021   PLT 370 04/17/2021   GLUCOSE 88 04/17/2021   CHOL 210 (H) 07/11/2020   TRIG 76.0 07/11/2020   HDL 66.00 07/11/2020   LDLDIRECT 132.6 01/29/2013   LDLCALC 129 (H) 07/11/2020   ALT 13 04/17/2021   AST 16 04/17/2021   NA 141 04/17/2021   K 4.5 04/17/2021   CL 105 04/17/2021   CREATININE 0.76 04/17/2021   BUN 13 04/17/2021   CO2 29 04/17/2021   TSH 1.46 04/17/2021   INR 0.9 10/27/2020       Assessment & Plan:   Problem List Items Addressed This Visit     Dizziness    Persistent dizziness/light headedness as outlined.  Has had an episode previously of vertigo.  Does appear to be reproducible on exam.  No increased sinus congestion.  Cerumen impaction in right ear.  Discussed debrox.  Can continue flonase.  Eating.  Staying hydrated.  Increased stress. Seeing psychiatry.  No cardiac symptoms.  rx given for meclizine.  Discussed possible side effects of medication.  Will remain out of work tomorrow (Friday).  Discussed need for further evaluation  to help determine etiology.  Refer to ENT for evaluation.  Hold on head scan at this time.  Follow closely. Call with update.       Relevant Orders   Ambulatory referral to ENT   GAD (generalized anxiety disorder)    Being followed by Dr Nicolasa Ducking.  On effexor.  Increased stress. Discussed.  Recommend f/u with Dr Nicolasa Ducking.         Health care maintenance    Physical today 04/23/21.  Hold on pap today given increased dizziness.  Mammogram ordered.  Need to schedule.        Hypercholesterolemia    Back on Wegovy.  Has lost weight.  Eating.  Staying hydrated.  Follow lipid panel.       Relevant Medications   candesartan (ATACAND) 4 MG tablet   Other Visit Diagnoses     Routine general medical examination at a health care facility    -  Primary        [name], MD

## 2021-04-24 ENCOUNTER — Encounter: Payer: Self-pay | Admitting: Internal Medicine

## 2021-04-24 DIAGNOSIS — R42 Dizziness and giddiness: Secondary | ICD-10-CM | POA: Insufficient documentation

## 2021-04-24 NOTE — Assessment & Plan Note (Signed)
Physical today 04/23/21.  Hold on pap today given increased dizziness.  Mammogram ordered.  Need to schedule.

## 2021-04-24 NOTE — Assessment & Plan Note (Signed)
Back on Wegovy.  Has lost weight.  Eating.  Staying hydrated.  Follow lipid panel.

## 2021-04-24 NOTE — Assessment & Plan Note (Signed)
Persistent dizziness/light headedness as outlined.  Has had an episode previously of vertigo.  Does appear to be reproducible on exam.  No increased sinus congestion.  Cerumen impaction in right ear.  Discussed debrox.  Can continue flonase.  Eating.  Staying hydrated.  Increased stress. Seeing psychiatry.  No cardiac symptoms.  rx given for meclizine.  Discussed possible side effects of medication.  Will remain out of work tomorrow (Friday).  Discussed need for further evaluation to help determine etiology.  Refer to ENT for evaluation.  Hold on head scan at this time.  Follow closely. Call with update.

## 2021-04-24 NOTE — Assessment & Plan Note (Addendum)
Being followed by Dr Nicolasa Ducking.  On effexor.  Increased stress. Discussed.  Recommend f/u with Dr Nicolasa Ducking.

## 2021-05-02 ENCOUNTER — Encounter: Payer: Self-pay | Admitting: Internal Medicine

## 2021-05-05 NOTE — Telephone Encounter (Signed)
If she is doing ok.  (Tolerating - no GI symptoms, etc) - ok to increase to .5mg  (currently on .25)

## 2021-05-08 ENCOUNTER — Other Ambulatory Visit: Payer: Self-pay

## 2021-05-08 MED ORDER — SEMAGLUTIDE-WEIGHT MANAGEMENT 0.5 MG/0.5ML ~~LOC~~ SOAJ: 0.5000 mg | SUBCUTANEOUS | 0 refills | Status: DC

## 2021-05-08 NOTE — Telephone Encounter (Signed)
[facility] 0.5mg  sent in. Patient is aware/.

## 2021-06-05 ENCOUNTER — Encounter: Payer: Self-pay | Admitting: Internal Medicine

## 2021-06-05 MED ORDER — WEGOVY 1 MG/0.5ML ~~LOC~~ SOAJ: 1.0000 mg | SUBCUTANEOUS | 1 refills | Status: DC

## 2021-06-05 NOTE — Telephone Encounter (Signed)
Please confirm she is on the .25mg  wegovy.  Ok to increase to .5mg .  ?

## 2021-06-05 NOTE — Telephone Encounter (Signed)
Received verbal from provider for 1mg  Wegovy. ?

## 2021-06-25 ENCOUNTER — Other Ambulatory Visit (HOSPITAL_COMMUNITY): Payer: Self-pay | Admitting: Student

## 2021-06-25 ENCOUNTER — Other Ambulatory Visit: Payer: Self-pay | Admitting: Student

## 2021-06-25 DIAGNOSIS — G43719 Chronic migraine without aura, intractable, without status migrainosus: Secondary | ICD-10-CM

## 2021-06-25 DIAGNOSIS — R42 Dizziness and giddiness: Secondary | ICD-10-CM

## 2021-07-02 ENCOUNTER — Other Ambulatory Visit: Payer: Self-pay | Admitting: Internal Medicine

## 2021-07-09 ENCOUNTER — Ambulatory Visit (HOSPITAL_COMMUNITY)
Admission: RE | Admit: 2021-07-09 | Discharge: 2021-07-09 | Disposition: A | Discharge status: Home / Self Care | Payer: BC Managed Care – PPO | Source: Ambulatory Visit | Attending: Student | Admitting: Student

## 2021-07-09 DIAGNOSIS — G43719 Chronic migraine without aura, intractable, without status migrainosus: Secondary | ICD-10-CM | POA: Diagnosis present

## 2021-07-09 DIAGNOSIS — R42 Dizziness and giddiness: Secondary | ICD-10-CM | POA: Insufficient documentation

## 2021-07-09 MED ORDER — GADOBUTROL 1 MMOL/ML IV SOLN
7.5000 mL | Freq: Once | INTRAVENOUS | Status: AC | PRN
  Administered 2021-07-09: 7.5 mL via INTRAVENOUS

## 2021-07-13 ENCOUNTER — Telehealth: Payer: Self-pay

## 2021-07-15 ENCOUNTER — Telehealth: Payer: Self-pay

## 2021-07-15 NOTE — Telephone Encounter (Signed)
[facility] PA initiated ?

## 2021-07-15 NOTE — Telephone Encounter (Signed)
Ariel Morgan (Key: [id number]) ?Rx #: [id number] ?Wegovy 2.'4MG'$ /0.75ML auto-injectors ?  ?Form ?[employer] Electronic PA Form [id number] NCPDP) ?

## 2021-07-17 ENCOUNTER — Telehealth: Payer: Self-pay

## 2021-07-17 NOTE — Telephone Encounter (Signed)
Ariel Morgan (Key: [id number]) ?Rx #: [id number] ?Wegovy 2.'4MG'$ /0.75ML auto-injectors ?  ?Form ?[employer] Electronic PA Form [phone] NCPDP) ?Created ?2 days ago ?Sent to Plan ?1 day ago ?Plan Response ?1 day ago ?Submit Clinical Questions ?1 day ago ?Determination ?Favorable ?21 hours ago ?Your prior authorization for [name] has been approved! ? ?Personalized support and financial assistance may be available through the [facility] [employer] program. For more information, and to see program requirements, click on the More Info button to the right. ? ?Message from plan: Your PA request has been approved. Additional information will be provided in the approval communication. (Message 1145) ?

## 2021-07-17 NOTE — Telephone Encounter (Signed)
PA approved - pt aware ?

## 2021-07-27 ENCOUNTER — Encounter: Payer: Self-pay | Admitting: Internal Medicine

## 2021-07-27 ENCOUNTER — Ambulatory Visit: Payer: BC Managed Care – PPO | Admitting: Internal Medicine

## 2021-07-27 DIAGNOSIS — E7800 Pure hypercholesterolemia, unspecified: Secondary | ICD-10-CM | POA: Diagnosis not present

## 2021-07-27 DIAGNOSIS — F411 Generalized anxiety disorder: Secondary | ICD-10-CM | POA: Diagnosis not present

## 2021-07-27 DIAGNOSIS — R42 Dizziness and giddiness: Secondary | ICD-10-CM

## 2021-07-27 DIAGNOSIS — E785 Hyperlipidemia, unspecified: Secondary | ICD-10-CM | POA: Diagnosis not present

## 2021-07-27 DIAGNOSIS — R519 Headache, unspecified: Secondary | ICD-10-CM | POA: Diagnosis not present

## 2021-07-27 DIAGNOSIS — R634 Abnormal weight loss: Secondary | ICD-10-CM

## 2021-07-27 NOTE — Progress Notes (Signed)
Patient ID: Ariel Morgan, female   DOB: [birth year], 46 y.o.   MRN: [id number] ? ? ?Subjective:  ? ? Patient ID: Ariel Morgan, female    DOB: Jul 16, 1975, 46 y.o.   MRN: [id number] ? ?This visit occurred during the SARS-CoV-2 public health emergency.  Safety protocols were in place, including screening questions prior to the visit, additional usage of staff PPE, and extensive cleaning of exam room while observing appropriate contact time as indicated for disinfecting solutions.  ? ?Patient here for a scheduled follow up.  ? ?Chief Complaint  ?Patient presents with  ? Follow-up  ?  2 mo f/u - pt still having episodes of dizziness/light headedness.  ? .  ? ?HPI ?Persistent dizziness.  Saw neurology.  Has -migraine with aura.  Recommended continuing maxalt prn, emgality - monthly.  MRI - unrevealing.  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  EMB/NCS - 05/12/21 - acute on chronic L5 radiculopathy on the right. Recommended continuing lyrica.  Also recommended contuing effexor.  Also evaluated by ENT for persistent dizziness.  States w/up unrevealing.  Wearing scopolamine patches.  Has valium.  Still with intermittent issues - heavy head, etc.  Discussed vestibular rehab.  Agreeable.  No chest pain or sob reported.  No abdominal pain or bowel change reported.  ? ? ?Past Medical History:  ?Diagnosis Date  ? Anemia   ? Anxiety   ? Arthritis   ? Depression   ? Frequent headaches   ? H/O  ? GERD (gastroesophageal reflux disease)   ? Hypercholesterolemia   ? Placenta previa   ? ?Past Surgical History:  ?Procedure Laterality Date  ? ABDOMINAL EXPOSURE N/A 01/01/2018  ? Procedure: ABDOMINAL EXPOSURE;  Surgeon: Marty Heck, MD;  Location: [place];  Service: Vascular;  Laterality: N/A;  ? ABDOMINAL HYSTERECTOMY    ? ANTERIOR LUMBAR FUSION N/A 01/01/2018  ? Procedure: ANTERIOR LUMBAR FUSION L5-S1; REPLACEMENT OF posterior HARDWARE L4-S1;  Surgeon: Melina Schools, MD;  Location: [place];   Service: Orthopedics;  Laterality: N/A;  ? BACK SURGERY    ? BREAST BIOPSY Right 05/10/2017  ? right breast stereotatic bx path pending  ? CESAREAN SECTION  2003 & 2010  ? CHOLECYSTECTOMY  2005  ? CYSTOSCOPY  09/11/2019  ? Procedure: CYSTOSCOPY;  Surgeon: Gae Dry, MD;  Location: [facility];  Service: Gynecology;;  ? LUMBAR LAMINECTOMY/DECOMPRESSION MICRODISCECTOMY N/A 12/28/2017  ? Procedure: L4-S1 decompression and fusion;  Surgeon: Melina Schools, MD;  Location: [place];  Service: Orthopedics;  Laterality: N/A;  4.5 hrs  ? SPINAL CORD STIMULATOR INSERTION N/A 10/30/2020  ? Procedure: SPINAL CORD STIMULATOR INSERTION;  Surgeon: Melina Schools, MD;  Location: [place];  Service: Orthopedics;  Laterality: N/A;  ? TOTAL LAPAROSCOPIC HYSTERECTOMY WITH SALPINGECTOMY Bilateral 09/11/2019  ? Procedure: TOTAL LAPAROSCOPIC HYSTERECTOMY WITH SALPINGECTOMY;  Surgeon: Gae Dry, MD;  Location: [facility];  Service: Gynecology;  Laterality: Bilateral;  ? ?Family History  ?Problem Relation Age of Onset  ? Hyperlipidemia Father   ? Diabetes Sister   ? Diabetes Maternal Grandmother   ? Cancer Maternal Grandmother   ?     vaginal  ? Cirrhosis Maternal Grandmother   ? Colon polyps Maternal Grandmother   ? Breast cancer Maternal Grandmother   ?     Early 70's  ? ?Social History  ? ?Socioeconomic History  ? Marital status: Married  ?  Spouse name: Not on file  ? Number of children: 3  ?  Years of education: Not on file  ? Highest education level: Not on file  ?Occupational History  ? Not on file  ?Tobacco Use  ? Smoking status: Former  ?  Packs/day: 1.00  ?  Types: Cigarettes  ? Smokeless tobacco: Never  ?Vaping Use  ? Vaping Use: Never used  ?Substance and Sexual Activity  ? Alcohol use: Not Currently  ?  Comment: rarely  ? Drug use: No  ? Sexual activity: Not on file  ?Other Topics Concern  ? Not on file  ?Social History Narrative  ? Not on file  ? ?Social Determinants of Health  ? ?Financial Resource Strain: Low Risk   ?  Difficulty of Paying Living Expenses: Not hard at all  ?Food Insecurity: Not on file  ?Transportation Needs: Not on file  ?Physical Activity: Not on file  ?Stress: Not on file  ?Social Connections: Not on file  ? ? ? ?Review of Systems  ?Constitutional:  Negative for appetite change and unexpected weight change.  ?HENT:  Negative for congestion and sinus pressure.   ?Respiratory:  Negative for cough, chest tightness and shortness of breath.   ?Cardiovascular:  Negative for chest pain, palpitations and leg swelling.  ?Gastrointestinal:  Negative for abdominal pain, diarrhea, nausea and vomiting.  ?Genitourinary:  Negative for difficulty urinating and dysuria.  ?Musculoskeletal:  Negative for joint swelling and myalgias.  ?Skin:  Negative for color change and rash.  ?Neurological:  Positive for dizziness and light-headedness.  ?Psychiatric/Behavioral:  Negative for agitation and dysphoric mood.   ? ?   ?Objective:  ?  ? ?BP 110/78 (BP Location: Left Arm, Patient Position: Sitting, Cuff Size: Small)   Pulse 100   Temp 98.2 ?F (36.8 ?C) (Temporal)   Resp 17   Ht '5\' 7"'$  (1.702 m)   Wt 167 lb 6.4 oz (75.9 kg)   SpO2 99%   BMI 26.22 kg/m?  ?Wt Readings from Last 3 Encounters:  ?07/27/21 167 lb 6.4 oz (75.9 kg)  ?07/09/21 165 lb 5.5 oz (75 kg)  ?04/23/21 161 lb 12.8 oz (73.4 kg)  ? ? ?Physical Exam ?Vitals reviewed.  ?Constitutional:   ?   General: She is not in acute distress. ?   Appearance: Normal appearance.  ?HENT:  ?   Head: Normocephalic and atraumatic.  ?   Right Ear: External ear normal.  ?   Left Ear: External ear normal.  ?Eyes:  ?   General: No scleral icterus.    ?   Right eye: No discharge.     ?   Left eye: No discharge.  ?   Conjunctiva/sclera: Conjunctivae normal.  ?Neck:  ?   Thyroid: No thyromegaly.  ?Cardiovascular:  ?   Rate and Rhythm: Normal rate and regular rhythm.  ?Pulmonary:  ?   Effort: No respiratory distress.  ?   Breath sounds: Normal breath sounds. No wheezing.  ?Abdominal:  ?    General: Bowel sounds are normal.  ?   Palpations: Abdomen is soft.  ?   Tenderness: There is no abdominal tenderness.  ?Musculoskeletal:     ?   General: No swelling or tenderness.  ?   Cervical back: Neck supple. No tenderness.  ?Lymphadenopathy:  ?   Cervical: No cervical adenopathy.  ?Skin: ?   Findings: No erythema or rash.  ?Neurological:  ?   Mental Status: She is alert.  ?Psychiatric:     ?   Mood and Affect: Mood normal.     ?  Behavior: Behavior normal.  ? ? ? ?Outpatient Encounter Medications as of 07/27/2021  ?Medication Sig  ? amphetamine-dextroamphetamine (ADDERALL XR) 30 MG 24 hr capsule Take 30 mg by mouth daily.  ? amphetamine-dextroamphetamine (ADDERALL) 10 MG tablet Take 10 mg by mouth daily as needed (focus).  ? candesartan (ATACAND) 4 MG tablet Take 4 mg by mouth daily.  ? EMGALITY 120 MG/ML SOAJ Inject into the skin.  ? fexofenadine (ALLEGRA) 60 MG tablet Take 60 mg by mouth 2 (two) times daily.   ? LYSINE PO Take 1 tablet by mouth daily.  ? MAGNESIUM PO Take 1 tablet by mouth daily.  ? meclizine (ANTIVERT) 12.5 MG tablet Take 1 tablet (12.5 mg total) by mouth 2 (two) times daily as needed for dizziness.  ? pregabalin (LYRICA) 150 MG capsule Take 150 mg by mouth at bedtime.  ? pregabalin (LYRICA) 50 MG capsule TAKE 1 CAPSULE (50 MG TOTAL) BY MOUTH 2 (TWO) TIMES DAILY.  ? rizatriptan (MAXALT) 10 MG tablet Take 10 mg by mouth daily as needed for migraine.  ? Semaglutide-Weight Management (WEGOVY) 1 MG/0.5ML SOAJ Inject 1 mg into the skin once a week.  ? triamcinolone (KENALOG) 0.025 % cream Apply 1 application topically 2 (two) times daily.  ? venlafaxine XR (EFFEXOR-XR) 75 MG 24 hr capsule Take 75 mg by mouth daily.  ? zinc gluconate 50 MG tablet Take 50 mg by mouth daily.   ? ?No facility-administered encounter medications on file as of 07/27/2021.  ?  ? ?Lab Results  ?Component Value Date  ? WBC 7.8 04/17/2021  ? HGB 13.1 04/17/2021  ? HCT 39.7 04/17/2021  ? PLT 370 04/17/2021  ? GLUCOSE 88  04/17/2021  ? CHOL 210 (H) 07/11/2020  ? TRIG 76.0 07/11/2020  ? HDL 66.00 07/11/2020  ? LDLDIRECT 132.6 01/29/2013  ? LDLCALC 129 (H) 07/11/2020  ? ALT 13 04/17/2021  ? AST 16 04/17/2021  ? NA 141 04/17/2021  ? K 4.5 01/

## 2021-08-02 ENCOUNTER — Encounter: Payer: Self-pay | Admitting: Internal Medicine

## 2021-08-02 NOTE — Assessment & Plan Note (Signed)
Sees neurology.  No increased headaches reported. ?

## 2021-08-02 NOTE — Assessment & Plan Note (Signed)
Being followed by Dr Nicolasa Ducking.  On effexor.  Discussed.  Recommend f/u with Dr Nicolasa Ducking.   ?

## 2021-08-02 NOTE — Assessment & Plan Note (Signed)
Has lost weight.  Continue diet and exercise.  Follow lipid panel.  

## 2021-08-02 NOTE — Assessment & Plan Note (Signed)
Has been on wegovy.  Tolerating.  Follow.  ?

## 2021-08-02 NOTE — Assessment & Plan Note (Signed)
Persistent dizziness/light headedness. Has had vertigo. Has been evaluated by ENT and neurology as outlined.  W/up unrevealing.  MRI ok.  ENT w/up unrevealing.  Still with issues as outlined.  Discussed trial - vestibular rehab.   ?

## 2021-08-02 NOTE — Assessment & Plan Note (Signed)
Low cholesterol diet and exercise.  Follow lipid panel.   

## 2021-08-07 ENCOUNTER — Encounter: Payer: Self-pay | Admitting: Internal Medicine

## 2021-08-07 ENCOUNTER — Telehealth: Payer: BC Managed Care – PPO | Admitting: Family

## 2021-08-07 DIAGNOSIS — U071 COVID-19: Secondary | ICD-10-CM

## 2021-08-07 MED ORDER — MOLNUPIRAVIR EUA 200MG CAPSULE: 4.0000 | ORAL_CAPSULE | Freq: Two times a day (BID) | ORAL | 0 refills | Status: AC

## 2021-08-07 NOTE — Progress Notes (Signed)
?Virtual Visit Consent  ? ?Ariel Morgan, you are scheduled for a virtual visit with a [facility] provider today. Just as with appointments in the office, your consent must be obtained to participate. Your consent will be active for this visit and any virtual visit you may have with one of our providers in the next 365 days. If you have a MyChart account, a copy of this consent can be sent to you electronically. ? ?As this is a virtual visit, video technology does not allow for your provider to perform a traditional examination. This may limit your provider's ability to fully assess your condition. If your provider identifies any concerns that need to be evaluated in person or the need to arrange testing (such as labs, EKG, etc.), we will make arrangements to do so. Although advances in technology are sophisticated, we cannot ensure that it will always work on either your end or our end. If the connection with a video visit is poor, the visit may have to be switched to a telephone visit. With either a video or telephone visit, we are not always able to ensure that we have a secure connection. ? ?By engaging in this virtual visit, you consent to the provision of healthcare and authorize for your insurance to be billed (if applicable) for the services provided during this visit. Depending on your insurance coverage, you may receive a charge related to this service. ? ?I need to obtain your verbal consent now. Are you willing to proceed with your visit today? Ariel Morgan has provided verbal consent on 08/07/2021 for a virtual visit (video or telephone). Ariel Dun, FNP ? ?Date: 08/07/2021 12:45 PM ? ?Virtual Visit via Video Note  ? ?IEvelina Morgan, connected with  Ariel Morgan  ([id number], 20-May-1975) on 08/07/21 at  1:00 PM EDT by a video-enabled telemedicine application and verified that I am speaking with the correct person using two identifiers. ? ?Location: ?Patient: Virtual Visit Location  Patient: Home ?Provider: Virtual Visit Location Provider: Home Office ?  ?I discussed the limitations of evaluation and management by telemedicine and the availability of in person appointments. The patient expressed understanding and agreed to proceed.   ? ?History of Present Illness: ?Ariel Morgan is a 46 y.o. who identifies as a female who was assigned female at birth, and is being seen today for cough that started yesterday. She took a COVID test that was positive.  ? ?HPI: URI  ?This is a new problem. The current episode started yesterday. The problem has been gradually worsening. There has been no fever. Associated symptoms include congestion, coughing, headaches, rhinorrhea, sinus pain, sneezing and a sore throat. Pertinent negatives include no diarrhea, dysuria or ear pain. She has tried acetaminophen for the symptoms. The treatment provided mild relief.   ?Problems:  ?Patient Active Problem List  ? Diagnosis Date Noted  ? Dizziness 04/24/2021  ? Lightheadedness 04/17/2021  ? Chronic pain 10/30/2020  ? Breast cancer screening 10/24/2020  ? Cough 06/22/2020  ? Hair loss 06/22/2020  ? S/P laparoscopic hysterectomy 09/19/2019  ? Fibroid 05/28/2019  ? RLQ abdominal pain 05/28/2019  ? Leg pain 02/25/2019  ? Weight loss 11/19/2018  ? Surgery, elective   ? Hyperlipidemia   ? Anxiety state   ? Acute blood loss anemia   ? Post-operative pain   ? FUO (fever of unknown origin)   ? S/P lumbar fusion 12/28/2017  ? Pre-op evaluation 12/11/2017  ? Abnormal mammogram 07/23/2017  ? Microcalcification of  right breast on mammogram 05/04/2017  ? Low back pain 06/17/2015  ? Health care maintenance 03/15/2015  ? Herpes zoster 09/16/2014  ? GAD (generalized anxiety disorder) 12/03/2012  ? GERD (gastroesophageal reflux disease) 12/03/2012  ? Headache 12/03/2012  ? Hypercholesterolemia 12/03/2012  ?  ?Allergies: No Known Allergies ?Medications:  ?Current Outpatient Medications:  ?  molnupiravir EUA (LAGEVRIO) 200 mg CAPS  capsule, Take 4 capsules (800 mg total) by mouth 2 (two) times daily for 5 days., Disp: 40 capsule, Rfl: 0 ?  amphetamine-dextroamphetamine (ADDERALL XR) 30 MG 24 hr capsule, Take 30 mg by mouth daily., Disp: , Rfl:  ?  amphetamine-dextroamphetamine (ADDERALL) 10 MG tablet, Take 10 mg by mouth daily as needed (focus)., Disp: , Rfl:  ?  EMGALITY 120 MG/ML SOAJ, Inject into the skin., Disp: , Rfl:  ?  fexofenadine (ALLEGRA) 60 MG tablet, Take 60 mg by mouth 2 (two) times daily. , Disp: , Rfl:  ?  LYSINE PO, Take 1 tablet by mouth daily., Disp: , Rfl:  ?  MAGNESIUM PO, Take 1 tablet by mouth daily., Disp: , Rfl:  ?  meclizine (ANTIVERT) 12.5 MG tablet, Take 1 tablet (12.5 mg total) by mouth 2 (two) times daily as needed for dizziness., Disp: 14 tablet, Rfl: 0 ?  pregabalin (LYRICA) 150 MG capsule, Take 150 mg by mouth at bedtime., Disp: , Rfl:  ?  pregabalin (LYRICA) 50 MG capsule, TAKE 1 CAPSULE (50 MG TOTAL) BY MOUTH 2 (TWO) TIMES DAILY., Disp: 60 capsule, Rfl: 1 ?  rizatriptan (MAXALT) 10 MG tablet, Take 10 mg by mouth daily as needed for migraine., Disp: , Rfl:  ?  Semaglutide-Weight Management (WEGOVY) 1 MG/0.5ML SOAJ, Inject 1 mg into the skin once a week., Disp: 2 mL, Rfl: 1 ?  triamcinolone (KENALOG) 0.025 % cream, Apply 1 application topically 2 (two) times daily., Disp: , Rfl:  ?  venlafaxine XR (EFFEXOR-XR) 75 MG 24 hr capsule, Take 75 mg by mouth daily., Disp: , Rfl:  ?  zinc gluconate 50 MG tablet, Take 50 mg by mouth daily. , Disp: , Rfl:  ? ?Observations/Objective: ?Patient is well-developed, well-nourished in no acute distress.  ?Resting comfortably  at home.  ?Head is normocephalic, atraumatic.  ?No labored breathing.  ?Speech is clear and coherent with logical content.  ?Patient is alert and oriented at baseline.  ? ? ?Assessment and Plan: ?1. COVID ?- molnupiravir EUA (LAGEVRIO) 200 mg CAPS capsule; Take 4 capsules (800 mg total) by mouth 2 (two) times daily for 5 days.  Dispense: 40 capsule; Refill:  0 ? ?COVID positive, rest, force fluids, tylenol as needed, Quarantine for at least 5 days and you are fever free, then must wear a mask out in public from day 2-59, report any worsening symptoms such as increased shortness of breath, swelling, or continued high fevers. Possible adverse effects discussed with antivirals.  ? ? ?Follow Up Instructions: ?I discussed the assessment and treatment plan with the patient. The patient was provided an opportunity to ask questions and all were answered. The patient agreed with the plan and demonstrated an understanding of the instructions.  A copy of instructions were sent to the patient via MyChart unless otherwise noted below.  ? ? ? ?The patient was advised to call back or seek an in-person evaluation if the symptoms worsen or if the condition fails to improve as anticipated. ? ?Time:  ?I spent 6 minutes with the patient via telehealth technology discussing the above problems/concerns.   ? ?Alyse Low  Lenna Gilford, [place] ? ?

## 2021-08-07 NOTE — Telephone Encounter (Signed)
I spoke with patient & she was able to do a mychart urgent care visit & was prescribed the molnupiravir anti-viral.  ?

## 2021-08-13 ENCOUNTER — Other Ambulatory Visit: Payer: Self-pay | Admitting: Internal Medicine

## 2021-08-13 DIAGNOSIS — Z713 Dietary counseling and surveillance: Secondary | ICD-10-CM

## 2021-08-17 ENCOUNTER — Other Ambulatory Visit: Payer: Self-pay

## 2021-08-17 ENCOUNTER — Encounter: Payer: Self-pay | Admitting: Internal Medicine

## 2021-08-17 MED ORDER — WEGOVY 1 MG/0.5ML ~~LOC~~ SOAJ: 1.0000 mg | SUBCUTANEOUS | 1 refills | Status: DC

## 2021-08-17 MED ORDER — RIZATRIPTAN BENZOATE 10 MG PO TABS: 10.0000 mg | ORAL_TABLET | Freq: Every day | ORAL | 0 refills | Status: DC | PRN

## 2021-09-14 ENCOUNTER — Other Ambulatory Visit: Payer: Self-pay | Admitting: Internal Medicine

## 2021-09-18 ENCOUNTER — Telehealth: Payer: Self-pay | Admitting: Internal Medicine

## 2021-09-18 NOTE — Telephone Encounter (Signed)
Pt dropped off Medical Eval for [employer] form to be filled out... Form will be in color folder upfront.Marland KitchenMarland Kitchen

## 2021-09-21 NOTE — Telephone Encounter (Signed)
Filled out - placed in dr scott box

## 2021-09-30 ENCOUNTER — Encounter: Payer: Self-pay | Admitting: Internal Medicine

## 2021-09-30 ENCOUNTER — Telehealth: Payer: Self-pay

## 2021-09-30 NOTE — Telephone Encounter (Signed)
I called left message for patient that her from for SS was ready & placed upfront for pick up. Will mychart patient back.

## 2021-09-30 NOTE — Telephone Encounter (Signed)
S/w pt - Apologized for the misunderstanding. Advised pt that I am not sure whom she spoke to when she dropped the paper work off but that you were on vacation. Pt was advised that you have returned from vacation but are in clinic hours. Pt advised that as soon as we can have paper work back to her , we will. Pt stated she understood, and not to worry, was just getting nervous with the holiday coming up. Pt stated she should have called to see if it was ready, but was in the area and figured she would just stop by.  I again apologized to the patient for the inconvenience and misinformation. Pt thanked me for my call, stated she understood.  I advised pt that I will call her as soon as papers are ready.

## 2021-09-30 NOTE — Telephone Encounter (Signed)
Form signed and placed in box.   

## 2021-10-09 ENCOUNTER — Encounter: Payer: Self-pay | Admitting: Internal Medicine

## 2021-10-12 NOTE — Telephone Encounter (Signed)
Ok to sent in 1.'7mg'$  dose.

## 2021-10-13 ENCOUNTER — Other Ambulatory Visit: Payer: Self-pay

## 2021-10-13 MED ORDER — WEGOVY 1.7 MG/0.75ML ~~LOC~~ SOAJ: 1.7000 mg | SUBCUTANEOUS | 3 refills | Status: DC

## 2021-10-15 ENCOUNTER — Other Ambulatory Visit: Payer: Self-pay

## 2021-10-15 MED ORDER — WEGOVY 1.7 MG/0.75ML ~~LOC~~ SOAJ: 1.7000 mg | SUBCUTANEOUS | 3 refills | Status: DC

## 2021-10-19 ENCOUNTER — Telehealth: Payer: Self-pay

## 2021-10-19 ENCOUNTER — Telehealth: Payer: Self-pay | Admitting: Internal Medicine

## 2021-10-19 DIAGNOSIS — Z1231 Encounter for screening mammogram for malignant neoplasm of breast: Secondary | ICD-10-CM

## 2021-10-19 NOTE — Telephone Encounter (Signed)
No day pref - likes afternoon - Sched for 8/8 2pm

## 2021-10-19 NOTE — Telephone Encounter (Signed)
Mammo sched

## 2021-10-19 NOTE — Telephone Encounter (Signed)
Lm for pt re: scheduling mammo  Date/time preferences ? Still using [facility]?

## 2021-10-19 NOTE — Telephone Encounter (Signed)
Received a notification that she is overdue a mammogram. Please schedule.

## 2021-10-26 ENCOUNTER — Ambulatory Visit: Payer: BC Managed Care – PPO | Admitting: Internal Medicine

## 2021-10-26 ENCOUNTER — Encounter: Payer: Self-pay | Admitting: Internal Medicine

## 2021-10-26 DIAGNOSIS — R634 Abnormal weight loss: Secondary | ICD-10-CM | POA: Diagnosis not present

## 2021-10-26 DIAGNOSIS — R519 Headache, unspecified: Secondary | ICD-10-CM | POA: Diagnosis not present

## 2021-10-26 DIAGNOSIS — M79606 Pain in leg, unspecified: Secondary | ICD-10-CM

## 2021-10-26 DIAGNOSIS — G8929 Other chronic pain: Secondary | ICD-10-CM

## 2021-10-26 DIAGNOSIS — F411 Generalized anxiety disorder: Secondary | ICD-10-CM

## 2021-10-26 DIAGNOSIS — M5440 Lumbago with sciatica, unspecified side: Secondary | ICD-10-CM

## 2021-10-26 DIAGNOSIS — E7800 Pure hypercholesterolemia, unspecified: Secondary | ICD-10-CM

## 2021-10-26 DIAGNOSIS — R42 Dizziness and giddiness: Secondary | ICD-10-CM

## 2021-10-26 MED ORDER — MAGNESIUM OXIDE -MG SUPPLEMENT 400 (240 MG) MG PO TABS: 400.0000 mg | ORAL_TABLET | Freq: Every day | ORAL | 2 refills | Status: DC

## 2021-10-26 MED ORDER — PIMECROLIMUS 1 % EX CREA: TOPICAL_CREAM | Freq: Two times a day (BID) | CUTANEOUS | 0 refills | Status: DC

## 2021-10-26 NOTE — Progress Notes (Signed)
Patient ID: MELIAH APPLEMAN, female   DOB: 1975-12-20, 46 y.o.   MRN: [id number]   Subjective:    Patient ID: [name], female    DOB: 1976/01/18, 46 y.o.   MRN: [id number]   Patient here for a scheduled follow up.   Chief Complaint  Patient presents with   Follow-up    3 month f/u   .   HPI Here to follow up regarding weight loss, increased stress and cholesterol.  Has seen neurology - diagnosed - migraine with aura.  S/p botox injection.  Helped.  Feels better.  No chest pain or sob reported.  No cough or congestion.  No acid reflux.  No abdominal pain.  Bowels moving.  History - back/leg pain as outlined.  Seeing ortho.    Past Medical History:  Diagnosis Date   Anemia    Anxiety    Arthritis    Depression    Frequent headaches    H/O   GERD (gastroesophageal reflux disease)    Hypercholesterolemia    Placenta previa    Past Surgical History:  Procedure Laterality Date   ABDOMINAL EXPOSURE N/A 01/01/2018   Procedure: ABDOMINAL EXPOSURE;  Surgeon: Marty Heck, MD;  Location: [facility];  Service: Vascular;  Laterality: N/A;   ABDOMINAL HYSTERECTOMY     ANTERIOR LUMBAR FUSION N/A 01/01/2018   Procedure: ANTERIOR LUMBAR FUSION L5-S1; REPLACEMENT OF posterior HARDWARE L4-S1;  Surgeon: Melina Schools, MD;  Location: [facility];  Service: Orthopedics;  Laterality: N/A;   BACK SURGERY     BREAST BIOPSY Right 05/10/2017   right breast stereotatic bx path pending   CESAREAN SECTION  2003 & 2010   CHOLECYSTECTOMY  2005   CYSTOSCOPY  09/11/2019   Procedure: CYSTOSCOPY;  Surgeon: Gae Dry, MD;  Location: [facility];  Service: Gynecology;;   LUMBAR LAMINECTOMY/DECOMPRESSION MICRODISCECTOMY N/A 12/28/2017   Procedure: L4-S1 decompression and fusion;  Surgeon: Melina Schools, MD;  Location: [facility];  Service: Orthopedics;  Laterality: N/A;  4.5 hrs   SPINAL CORD STIMULATOR INSERTION N/A 10/30/2020   Procedure: SPINAL CORD STIMULATOR INSERTION;  Surgeon: Melina Schools,  MD;  Location: [facility];  Service: Orthopedics;  Laterality: N/A;   TOTAL LAPAROSCOPIC HYSTERECTOMY WITH SALPINGECTOMY Bilateral 09/11/2019   Procedure: TOTAL LAPAROSCOPIC HYSTERECTOMY WITH SALPINGECTOMY;  Surgeon: Gae Dry, MD;  Location: [facility];  Service: Gynecology;  Laterality: Bilateral;   Family History  Problem Relation Age of Onset   Hyperlipidemia Father    Diabetes Sister    Diabetes Maternal Grandmother    Cancer Maternal Grandmother        vaginal   Cirrhosis Maternal Grandmother    Colon polyps Maternal Grandmother    Breast cancer Maternal Grandmother        Early 20's   Social History   Socioeconomic History   Marital status: Married    Spouse name: Not on file   Number of children: 3   Years of education: Not on file   Highest education level: Not on file  Occupational History   Not on file  Tobacco Use   Smoking status: Former    Packs/day: 1.00    Types: Cigarettes   Smokeless tobacco: Never  Vaping Use   Vaping Use: Never used  Substance and Sexual Activity   Alcohol use: Not Currently    Comment: rarely   Drug use: No   Sexual activity: Not on file  Other Topics Concern   Not on file  Social History  Narrative   Not on file   Social Determinants of Health   Financial Resource Strain: Low Risk  (08/20/2020)   Overall Financial Resource Strain (CARDIA)    Difficulty of Paying Living Expenses: Not hard at all  Food Insecurity: Not on file  Transportation Needs: Not on file  Physical Activity: Insufficiently Active (10/11/2019)   Exercise Vital Sign    Days of Exercise per Week: 3 days    Minutes of Exercise per Session: 30 min  Stress: Not on file  Social Connections: Not on file     Review of Systems  Constitutional:  Negative for appetite change and unexpected weight change.  HENT:  Negative for congestion and sinus pressure.   Respiratory:  Negative for cough, chest tightness and shortness of breath.   Cardiovascular:  Negative for  chest pain, palpitations and leg swelling.  Gastrointestinal:  Negative for abdominal pain, diarrhea, nausea and vomiting.  Genitourinary:  Negative for difficulty urinating and dysuria.  Musculoskeletal:  Negative for joint swelling and myalgias.  Skin:  Negative for color change and rash.  Neurological:        Problems with headaches, dizziness as outlined.  Better.   Psychiatric/Behavioral:  Negative for agitation and dysphoric mood.        Objective:     BP 120/62 (BP Location: Left Arm, Patient Position: Sitting, Cuff Size: Normal)   Pulse 93   Temp 98.6 F (37 C) (Oral)   Resp 18   Ht '5\' 7"'$  (1.702 m)   Wt 164 lb 6.4 oz (74.6 kg)   SpO2 99%   BMI 25.75 kg/m  Wt Readings from Last 3 Encounters:  10/26/21 164 lb 6.4 oz (74.6 kg)  07/27/21 167 lb 6.4 oz (75.9 kg)  07/09/21 165 lb 5.5 oz (75 kg)    Physical Exam Vitals reviewed.  Constitutional:      General: She is not in acute distress.    Appearance: Normal appearance.  HENT:     Head: Normocephalic and atraumatic.     Right Ear: External ear normal.     Left Ear: External ear normal.  Eyes:     General: No scleral icterus.       Right eye: No discharge.        Left eye: No discharge.     Conjunctiva/sclera: Conjunctivae normal.  Neck:     Thyroid: No thyromegaly.  Cardiovascular:     Rate and Rhythm: Normal rate and regular rhythm.  Pulmonary:     Effort: No respiratory distress.     Breath sounds: Normal breath sounds. No wheezing.  Abdominal:     General: Bowel sounds are normal.     Palpations: Abdomen is soft.     Tenderness: There is no abdominal tenderness.  Musculoskeletal:        General: No swelling or tenderness.     Cervical back: Neck supple. No tenderness.  Lymphadenopathy:     Cervical: No cervical adenopathy.  Skin:    Findings: No erythema or rash.  Neurological:     Mental Status: She is alert.  Psychiatric:        Mood and Affect: Mood normal.        Behavior: Behavior normal.       Outpatient Encounter Medications as of 10/26/2021  Medication Sig   amphetamine-dextroamphetamine (ADDERALL XR) 30 MG 24 hr capsule Take 30 mg by mouth daily.   amphetamine-dextroamphetamine (ADDERALL) 10 MG tablet Take 10 mg by mouth daily as needed (focus).  EMGALITY 120 MG/ML SOAJ Inject into the skin.   fexofenadine (ALLEGRA) 60 MG tablet Take 60 mg by mouth 2 (two) times daily.    LYSINE PO Take 1 tablet by mouth daily.   magnesium oxide (MAG-OX) 400 (240 Mg) MG tablet Take 1 tablet (400 mg total) by mouth daily.   meclizine (ANTIVERT) 12.5 MG tablet Take 1 tablet (12.5 mg total) by mouth 2 (two) times daily as needed for dizziness.   pimecrolimus (ELIDEL) 1 % cream Apply topically 2 (two) times daily.   pregabalin (LYRICA) 150 MG capsule Take 100 mg by mouth at bedtime.   pregabalin (LYRICA) 50 MG capsule TAKE 1 CAPSULE (50 MG TOTAL) BY MOUTH 2 (TWO) TIMES DAILY.   rizatriptan (MAXALT) 10 MG tablet TAKE 1 TABLET (10 MG TOTAL) BY MOUTH DAILY AS NEEDED FOR MIGRAINE.   Semaglutide-Weight Management (WEGOVY) 1.7 MG/0.75ML SOAJ Inject 1.7 mg into the skin once a week.   triamcinolone (KENALOG) 0.025 % cream Apply 1 application topically 2 (two) times daily.   venlafaxine XR (EFFEXOR-XR) 75 MG 24 hr capsule Take 75 mg by mouth daily.   zinc gluconate 50 MG tablet Take 50 mg by mouth daily.    [DISCONTINUED] MAGNESIUM PO Take 1 tablet by mouth daily.   No facility-administered encounter medications on file as of 10/26/2021.     Lab Results  Component Value Date   WBC 7.8 04/17/2021   HGB 13.1 04/17/2021   HCT 39.7 04/17/2021   PLT 370 04/17/2021   GLUCOSE 88 04/17/2021   CHOL 210 (H) 07/11/2020   TRIG 76.0 07/11/2020   HDL 66.00 07/11/2020   LDLDIRECT 132.6 01/29/2013   LDLCALC 129 (H) 07/11/2020   ALT 13 04/17/2021   AST 16 04/17/2021   NA 141 04/17/2021   K 4.5 04/17/2021   CL 105 04/17/2021   CREATININE 0.76 04/17/2021   BUN 13 04/17/2021   CO2 29 04/17/2021   TSH  1.46 04/17/2021   INR 0.9 10/27/2020    MR BRAIN W WO CONTRAST  Result Date: 07/09/2021 CLINICAL DATA:  Chronic migraine without aura and without status migrainosus, dizziness EXAM: MRI HEAD WITHOUT AND WITH CONTRAST TECHNIQUE: Multiplanar, multiecho pulse sequences of the brain and surrounding structures were obtained without and with intravenous contrast. CONTRAST:  7.40m GADAVIST GADOBUTROL 1 MMOL/ML IV SOLN COMPARISON:  04/01/2011 FINDINGS: Brain: No acute infarct, mass effect or extra-axial collection. No acute or chronic hemorrhage. Normal white matter signal, parenchymal volume and CSF spaces. The midline structures are normal. There is no abnormal contrast enhancement. Vascular: Major flow voids are preserved. Skull and upper cervical spine: Normal calvarium and skull base. Visualized upper cervical spine and soft tissues are normal. Sinuses/Orbits:No paranasal sinus fluid levels or advanced mucosal thickening. No mastoid or middle ear effusion. Normal orbits. IMPRESSION: Normal brain MRI. Electronically Signed   By: KUlyses JarredM.D.   On: 07/09/2021 22:58       Assessment & Plan:   Problem List Items Addressed This Visit     Dizziness    Saw neurology.  And ENT.  MRI ok.  ENT w/up unrevealing.  Had discussed vestibular rehab.  Doing better.  Follow.       GAD (generalized anxiety disorder)    Being followed by Dr KNicolasa Ducking  On effexor.  Stable.       Headache    Saw neurology as outlined.  botox helped.  Better.  Follow.       Hypercholesterolemia    Has lost weight.  Continue diet and exercise.  Follow lipid panel.       Relevant Orders   Comprehensive metabolic panel   Lipid panel   Leg pain    Leg pain. Taking lyrica.  Has seen ortho.       Low back pain    Has a history of low back pain/leg pain - has seen ortho.  Spinal cord stimulator 11/2020.        Weight loss    On wegovy.  1.7 dose.  Tolerating.  Follow.  Continue diet and exercise.         [name], MD

## 2021-11-01 ENCOUNTER — Encounter: Payer: Self-pay | Admitting: Internal Medicine

## 2021-11-01 NOTE — Assessment & Plan Note (Signed)
On wegovy.  1.7 dose.  Tolerating.  Follow.  Continue diet and exercise.

## 2021-11-01 NOTE — Assessment & Plan Note (Signed)
Saw neurology as outlined.  botox helped.  Better.  Follow.

## 2021-11-01 NOTE — Assessment & Plan Note (Signed)
Has lost weight.  Continue diet and exercise.  Follow lipid panel.  

## 2021-11-01 NOTE — Assessment & Plan Note (Signed)
Has a history of low back pain/leg pain - has seen ortho.  Spinal cord stimulator 11/2020.

## 2021-11-01 NOTE — Assessment & Plan Note (Signed)
Saw neurology.  And ENT.  MRI ok.  ENT w/up unrevealing.  Had discussed vestibular rehab.  Doing better.  Follow.

## 2021-11-01 NOTE — Assessment & Plan Note (Signed)
Being followed by Dr Kapur.  On effexor.  Stable.  

## 2021-11-01 NOTE — Assessment & Plan Note (Signed)
Leg pain. Taking lyrica.  Has seen ortho.

## 2021-11-02 ENCOUNTER — Encounter: Payer: Self-pay | Admitting: Internal Medicine

## 2021-11-10 ENCOUNTER — Ambulatory Visit
Admission: RE | Admit: 2021-11-10 | Discharge: 2021-11-10 | Disposition: A | Discharge status: Home / Self Care | Payer: BC Managed Care – PPO | Source: Ambulatory Visit | Attending: Internal Medicine | Admitting: Internal Medicine

## 2021-11-10 DIAGNOSIS — Z1231 Encounter for screening mammogram for malignant neoplasm of breast: Secondary | ICD-10-CM | POA: Diagnosis present

## 2021-12-01 ENCOUNTER — Other Ambulatory Visit: Payer: Self-pay | Admitting: Internal Medicine

## 2021-12-28 ENCOUNTER — Other Ambulatory Visit (INDEPENDENT_AMBULATORY_CARE_PROVIDER_SITE_OTHER): Payer: BC Managed Care – PPO

## 2021-12-28 DIAGNOSIS — E7800 Pure hypercholesterolemia, unspecified: Secondary | ICD-10-CM

## 2021-12-28 LAB — COMPREHENSIVE METABOLIC PANEL
ALT: 13 U/L (ref 0–35)
AST: 13 U/L (ref 0–37)
Albumin: 4.1 g/dL (ref 3.5–5.2)
Alkaline Phosphatase: 72 U/L (ref 39–117)
BUN: 6 mg/dL (ref 6–23)
CO2: 27 mEq/L (ref 19–32)
Calcium: 9 mg/dL (ref 8.4–10.5)
Chloride: 104 mEq/L (ref 96–112)
Creatinine, Ser: 0.72 mg/dL (ref 0.40–1.20)
GFR: 100.6 mL/min (ref >60.00)
Glucose, Bld: 85 mg/dL (ref 70–99)
Potassium: 4.2 mEq/L (ref 3.5–5.1)
Sodium: 139 mEq/L (ref 135–145)
Total Bilirubin: 0.6 mg/dL (ref 0.2–1.2)
Total Protein: 6.5 g/dL (ref 6.0–8.3)

## 2021-12-28 LAB — LIPID PANEL
Cholesterol: 168 mg/dL (ref 0–200)
HDL: 66.5 mg/dL (ref >39.00)
LDL Cholesterol: 90 mg/dL (ref 0–99)
NonHDL: 101.28
Total CHOL/HDL Ratio: 3
Triglycerides: 57 mg/dL (ref 0.0–149.0)
VLDL: 11.4 mg/dL (ref 0.0–40.0)

## 2022-01-28 ENCOUNTER — Ambulatory Visit: Payer: BC Managed Care – PPO | Admitting: Internal Medicine

## 2022-01-28 ENCOUNTER — Encounter: Payer: Self-pay | Admitting: Internal Medicine

## 2022-01-28 VITALS — BP 118/82 | HR 95 | Temp 99.0°F | Ht 67.0 in | Wt 154.2 lb

## 2022-01-28 DIAGNOSIS — F411 Generalized anxiety disorder: Secondary | ICD-10-CM

## 2022-01-28 DIAGNOSIS — R634 Abnormal weight loss: Secondary | ICD-10-CM

## 2022-01-28 DIAGNOSIS — R519 Headache, unspecified: Secondary | ICD-10-CM | POA: Diagnosis not present

## 2022-01-28 DIAGNOSIS — M5440 Lumbago with sciatica, unspecified side: Secondary | ICD-10-CM

## 2022-01-28 DIAGNOSIS — E7800 Pure hypercholesterolemia, unspecified: Secondary | ICD-10-CM

## 2022-01-28 DIAGNOSIS — R232 Flushing: Secondary | ICD-10-CM | POA: Diagnosis not present

## 2022-01-28 DIAGNOSIS — G8929 Other chronic pain: Secondary | ICD-10-CM

## 2022-01-28 NOTE — Progress Notes (Signed)
H Patient ID: Ariel Morgan, female   DOB: 13-Feb-1976, 46 y.o.   MRN: [id number]   Subjective:    Patient ID: Ariel Morgan, female    DOB: Feb 11, 1976, 46 y.o.   MRN: [id number]   Patient here for  Chief Complaint  Patient presents with   Follow-up    3 month f/u   .   HPI Here to follow up regarding increased stress and elevated cholesterol.  Seeing neurology for migraines.  Recommended continuing emgality.  Has maxalt to take prn.  Headaches are better.  Previous MRI - normal. S/p spinal cord stimulator with NSU.   NCS/EMG Lower Extremity 05/12/2021 - This is an abnormal electrodiagnostic exam consistent with an acute on chronic L5 radiculopathy on the right. Lyrica 100 mg by mouth two times a day (twice daily).  Tries to stay active.  No chest pain or sob reported.  No abdominal pain or bowel change reported.  On effexor - increased stress.     Past Medical History:  Diagnosis Date   Anemia    Anxiety    Arthritis    Depression    Frequent headaches    H/O   GERD (gastroesophageal reflux disease)    Hypercholesterolemia    Placenta previa    Past Surgical History:  Procedure Laterality Date   ABDOMINAL EXPOSURE N/A 01/01/2018   Procedure: ABDOMINAL EXPOSURE;  Surgeon: Marty Heck, MD;  Location: [facility];  Service: Vascular;  Laterality: N/A;   ABDOMINAL HYSTERECTOMY     ANTERIOR LUMBAR FUSION N/A 01/01/2018   Procedure: ANTERIOR LUMBAR FUSION L5-S1; REPLACEMENT OF posterior HARDWARE L4-S1;  Surgeon: Melina Schools, MD;  Location: [place];  Service: Orthopedics;  Laterality: N/A;   BACK SURGERY     BREAST BIOPSY Right 05/10/2017   right breast stereotatic bx path pending   CESAREAN SECTION  2003 & 2010   CHOLECYSTECTOMY  2005   CYSTOSCOPY  09/11/2019   Procedure: CYSTOSCOPY;  Surgeon: Gae Dry, MD;  Location: [facility];  Service: Gynecology;;   LUMBAR LAMINECTOMY/DECOMPRESSION MICRODISCECTOMY N/A 12/28/2017   Procedure: L4-S1 decompression and fusion;   Surgeon: Melina Schools, MD;  Location: [place];  Service: Orthopedics;  Laterality: N/A;  4.5 hrs   SPINAL CORD STIMULATOR INSERTION N/A 10/30/2020   Procedure: SPINAL CORD STIMULATOR INSERTION;  Surgeon: Melina Schools, MD;  Location: [place];  Service: Orthopedics;  Laterality: N/A;   TOTAL LAPAROSCOPIC HYSTERECTOMY WITH SALPINGECTOMY Bilateral 09/11/2019   Procedure: TOTAL LAPAROSCOPIC HYSTERECTOMY WITH SALPINGECTOMY;  Surgeon: Gae Dry, MD;  Location: [facility];  Service: Gynecology;  Laterality: Bilateral;   Family History  Problem Relation Age of Onset   Hyperlipidemia Father    Diabetes Sister    Diabetes Maternal Grandmother    Cancer Maternal Grandmother        vaginal   Cirrhosis Maternal Grandmother    Colon polyps Maternal Grandmother    Breast cancer Maternal Grandmother        Early 72's   Social History   Socioeconomic History   Marital status: Married    Spouse name: Not on file   Number of children: 3   Years of education: Not on file   Highest education level: Not on file  Occupational History   Not on file  Tobacco Use   Smoking status: Former    Packs/day: 1.00    Types: Cigarettes   Smokeless tobacco: Never  Vaping Use   Vaping Use: Never used  Substance and Sexual Activity  Alcohol use: Not Currently    Comment: rarely   Drug use: No   Sexual activity: Not on file  Other Topics Concern   Not on file  Social History Narrative   Not on file   Social Determinants of Health   Financial Resource Strain: Low Risk  (08/20/2020)   Overall Financial Resource Strain (CARDIA)    Difficulty of Paying Living Expenses: Not hard at all  Food Insecurity: Not on file  Transportation Needs: Not on file  Physical Activity: Insufficiently Active (10/11/2019)   Exercise Vital Sign    Days of Exercise per Week: 3 days    Minutes of Exercise per Session: 30 min  Stress: Not on file  Social Connections: Not on file     Review of Systems  Constitutional:   Negative for appetite change and unexpected weight change.  HENT:  Negative for congestion and sinus pressure.   Respiratory:  Negative for cough, chest tightness and shortness of breath.   Cardiovascular:  Negative for chest pain, palpitations and leg swelling.  Gastrointestinal:  Negative for abdominal pain, diarrhea, nausea and vomiting.  Genitourinary:  Negative for difficulty urinating and dysuria.  Musculoskeletal:  Negative for joint swelling and myalgias.  Skin:  Negative for color change and rash.  Neurological:  Negative for dizziness, light-headedness and headaches.  Psychiatric/Behavioral:  Negative for agitation and dysphoric mood.        Objective:     BP 118/82 (BP Location: Left Arm, Patient Position: Sitting, Cuff Size: Normal)   Pulse 95   Temp 99 F (37.2 C) (Oral)   Ht '5\' 7"'$  (1.702 m)   Wt 154 lb 3.2 oz (69.9 kg)   SpO2 98%   BMI 24.15 kg/m  Wt Readings from Last 3 Encounters:  01/28/22 154 lb 3.2 oz (69.9 kg)  10/26/21 164 lb 6.4 oz (74.6 kg)  07/27/21 167 lb 6.4 oz (75.9 kg)    Physical Exam Vitals reviewed.  Constitutional:      General: She is not in acute distress.    Appearance: Normal appearance.  HENT:     Head: Normocephalic and atraumatic.     Right Ear: External ear normal.     Left Ear: External ear normal.  Eyes:     General: No scleral icterus.       Right eye: No discharge.        Left eye: No discharge.     Conjunctiva/sclera: Conjunctivae normal.  Neck:     Thyroid: No thyromegaly.  Cardiovascular:     Rate and Rhythm: Normal rate and regular rhythm.  Pulmonary:     Effort: No respiratory distress.     Breath sounds: Normal breath sounds. No wheezing.  Abdominal:     General: Bowel sounds are normal.     Palpations: Abdomen is soft.     Tenderness: There is no abdominal tenderness.  Musculoskeletal:        General: No swelling or tenderness.     Cervical back: Neck supple. No tenderness.  Lymphadenopathy:     Cervical:  No cervical adenopathy.  Skin:    Findings: No erythema or rash.  Neurological:     Mental Status: She is alert.  Psychiatric:        Mood and Affect: Mood normal.        Behavior: Behavior normal.      Outpatient Encounter Medications as of 01/28/2022  Medication Sig   amphetamine-dextroamphetamine (ADDERALL XR) 30 MG 24 hr capsule Take 30  mg by mouth daily.   amphetamine-dextroamphetamine (ADDERALL) 10 MG tablet Take 10 mg by mouth daily as needed (focus).   EMGALITY 120 MG/ML SOAJ Inject into the skin.   fexofenadine (ALLEGRA) 60 MG tablet Take 60 mg by mouth 2 (two) times daily.    LYSINE PO Take 1 tablet by mouth daily.   meclizine (ANTIVERT) 12.5 MG tablet Take 1 tablet (12.5 mg total) by mouth 2 (two) times daily as needed for dizziness.   pimecrolimus (ELIDEL) 1 % cream Apply topically 2 (two) times daily.   pregabalin (LYRICA) 150 MG capsule Take 100 mg by mouth at bedtime.   pregabalin (LYRICA) 50 MG capsule TAKE 1 CAPSULE (50 MG TOTAL) BY MOUTH 2 (TWO) TIMES DAILY.   rizatriptan (MAXALT) 10 MG tablet TAKE 1 TABLET (10 MG TOTAL) BY MOUTH DAILY AS NEEDED FOR MIGRAINE AS DIRECTED   Semaglutide-Weight Management (WEGOVY) 1.7 MG/0.75ML SOAJ Inject 1.7 mg into the skin once a week.   triamcinolone (KENALOG) 0.025 % cream Apply 1 application topically 2 (two) times daily.   venlafaxine XR (EFFEXOR-XR) 75 MG 24 hr capsule Take 75 mg by mouth daily.   zinc gluconate 50 MG tablet Take 50 mg by mouth daily.    [DISCONTINUED] magnesium oxide (MAG-OX) 400 (240 Mg) MG tablet Take 1 tablet (400 mg total) by mouth daily.   No facility-administered encounter medications on file as of 01/28/2022.     Lab Results  Component Value Date   WBC 7.8 04/17/2021   HGB 13.1 04/17/2021   HCT 39.7 04/17/2021   PLT 370 04/17/2021   GLUCOSE 85 12/28/2021   CHOL 168 12/28/2021   TRIG 57.0 12/28/2021   HDL 66.50 12/28/2021   LDLDIRECT 132.6 01/29/2013   LDLCALC 90 12/28/2021   ALT 13  12/28/2021   AST 13 12/28/2021   NA 139 12/28/2021   K 4.2 12/28/2021   CL 104 12/28/2021   CREATININE 0.72 12/28/2021   BUN 6 12/28/2021   CO2 27 12/28/2021   TSH 1.46 04/17/2021   INR 0.9 10/27/2020    MM 3D SCREEN BREAST BILATERAL  Result Date: 11/11/2021 CLINICAL DATA:  Screening. EXAM: DIGITAL SCREENING BILATERAL MAMMOGRAM WITH TOMOSYNTHESIS AND CAD TECHNIQUE: Bilateral screening digital craniocaudal and mediolateral oblique mammograms were obtained. Bilateral screening digital breast tomosynthesis was performed. The images were evaluated with computer-aided detection. COMPARISON:  Previous exam(s). ACR Breast Density Category c: The breast tissue is heterogeneously dense, which may obscure small masses. FINDINGS: There are no findings suspicious for malignancy. IMPRESSION: No mammographic evidence of malignancy. A result letter of this screening mammogram will be mailed directly to the patient. RECOMMENDATION: Screening mammogram in one year. (Code:SM-B-01Y) BI-RADS CATEGORY  1: Negative. Electronically Signed   By: Lillia Mountain M.D.   On: 11/11/2021 10:57       Assessment & Plan:   Problem List Items Addressed This Visit     GAD (generalized anxiety disorder) - Primary    Being followed by Dr Nicolasa Ducking.  On effexor.  Stable.       Headache    Seeing neurology for migraines.  Recommended continuing emgality.  Has maxalt to take prn.  Headaches are better.       Hot flashes    On effexor.        Relevant Orders   FSH (Completed)   Hypercholesterolemia    Has lost weight.  Continue diet and exercise.  Follow lipid panel.       Low back pain  Has a history of low back pain/leg pain - has seen ortho.  Spinal cord stimulator 11/2020.  On lyrica.        Weight loss    Continue wegovy. Continue diet and exercise.         [name], MD

## 2022-01-29 ENCOUNTER — Other Ambulatory Visit: Payer: Self-pay | Admitting: Internal Medicine

## 2022-01-29 LAB — FOLLICLE STIMULATING HORMONE: FSH: 14.1 m[IU]/mL

## 2022-02-07 ENCOUNTER — Encounter: Payer: Self-pay | Admitting: Internal Medicine

## 2022-02-07 NOTE — Assessment & Plan Note (Signed)
Being followed by Dr Nicolasa Ducking.  On effexor.  Stable.

## 2022-02-07 NOTE — Assessment & Plan Note (Signed)
Seeing neurology for migraines.  Recommended continuing emgality.  Has maxalt to take prn.  Headaches are better.

## 2022-02-07 NOTE — Assessment & Plan Note (Signed)
Has a history of low back pain/leg pain - has seen ortho.  Spinal cord stimulator 11/2020.  On lyrica.

## 2022-02-07 NOTE — Assessment & Plan Note (Signed)
Has lost weight.  Continue diet and exercise.  Follow lipid panel.

## 2022-02-07 NOTE — Assessment & Plan Note (Signed)
Continue wegovy. Continue diet and exercise.

## 2022-02-07 NOTE — Assessment & Plan Note (Signed)
On effexor.

## 2022-02-12 ENCOUNTER — Telehealth: Payer: BC Managed Care – PPO | Admitting: Physician Assistant

## 2022-02-12 DIAGNOSIS — J020 Streptococcal pharyngitis: Secondary | ICD-10-CM | POA: Diagnosis not present

## 2022-02-12 MED ORDER — AMOXICILLIN 500 MG PO TABS: 500.0000 mg | ORAL_TABLET | Freq: Two times a day (BID) | ORAL | 0 refills | Status: AC

## 2022-02-12 NOTE — Progress Notes (Signed)

## 2022-03-10 ENCOUNTER — Other Ambulatory Visit: Payer: Self-pay | Admitting: Internal Medicine

## 2022-03-12 MED ORDER — WEGOVY 1.7 MG/0.75ML ~~LOC~~ SOAJ: 1.7000 mg | SUBCUTANEOUS | 2 refills | Status: DC

## 2022-03-12 NOTE — Telephone Encounter (Signed)
Rx ok'd for wegovy.

## 2022-04-07 ENCOUNTER — Encounter: Payer: Self-pay | Admitting: Internal Medicine

## 2022-04-08 ENCOUNTER — Other Ambulatory Visit: Payer: Self-pay

## 2022-04-08 MED ORDER — WEGOVY 1.7 MG/0.75ML ~~LOC~~ SOAJ: 1.7000 mg | SUBCUTANEOUS | 3 refills | Status: DC

## 2022-04-30 ENCOUNTER — Other Ambulatory Visit (HOSPITAL_COMMUNITY)
Admission: RE | Admit: 2022-04-30 | Discharge: 2022-04-30 | Disposition: A | Discharge status: Home / Self Care | Payer: BC Managed Care – PPO | Source: Ambulatory Visit | Attending: Internal Medicine | Admitting: Internal Medicine

## 2022-04-30 ENCOUNTER — Ambulatory Visit (INDEPENDENT_AMBULATORY_CARE_PROVIDER_SITE_OTHER): Payer: BC Managed Care – PPO | Admitting: Internal Medicine

## 2022-04-30 ENCOUNTER — Encounter: Payer: Self-pay | Admitting: Internal Medicine

## 2022-04-30 VITALS — BP 118/70 | HR 89 | Temp 97.9°F | Resp 16 | Ht 62.0 in | Wt 150.4 lb

## 2022-04-30 DIAGNOSIS — Z0000 Encounter for general adult medical examination without abnormal findings: Secondary | ICD-10-CM

## 2022-04-30 DIAGNOSIS — F411 Generalized anxiety disorder: Secondary | ICD-10-CM

## 2022-04-30 DIAGNOSIS — Z124 Encounter for screening for malignant neoplasm of cervix: Secondary | ICD-10-CM

## 2022-04-30 DIAGNOSIS — E7800 Pure hypercholesterolemia, unspecified: Secondary | ICD-10-CM | POA: Diagnosis not present

## 2022-04-30 DIAGNOSIS — G8929 Other chronic pain: Secondary | ICD-10-CM

## 2022-04-30 DIAGNOSIS — Z1211 Encounter for screening for malignant neoplasm of colon: Secondary | ICD-10-CM

## 2022-04-30 DIAGNOSIS — B001 Herpesviral vesicular dermatitis: Secondary | ICD-10-CM

## 2022-04-30 DIAGNOSIS — M5440 Lumbago with sciatica, unspecified side: Secondary | ICD-10-CM

## 2022-04-30 DIAGNOSIS — R519 Headache, unspecified: Secondary | ICD-10-CM

## 2022-04-30 MED ORDER — ACYCLOVIR 400 MG PO TABS: ORAL_TABLET | ORAL | 0 refills | Status: DC

## 2022-04-30 NOTE — Assessment & Plan Note (Signed)
Physical today 04/30/22.  PAP 04/30/22.  Mammogram 11/10/21 - Briads II.  Due colonoscopy.  Refer to GI.

## 2022-04-30 NOTE — Progress Notes (Unsigned)
Subjective:    Patient ID: Ariel Morgan, female    DOB: [birth year], 47 y.o.   MRN: [id number]  Patient here for  Chief Complaint  Patient presents with   Annual Exam    HPI Here for her physical exam. Increased stress.  Discussed.  Adopted  - daughter.  Increased stress related.  Increased stress with work.  Seeing Dr Nicolasa Ducking. Effexor to '150mg'$  - helped.  Does not feel needs any further intervention at this time.  Tries to stay active.  Still with persistent pain.  Has seen ortho.  Nothing has seemed to make a significant difference.  Discussed pain clinic.  Will notify me if desires.  No chest pain or sob reported.  No abdominal pain or bowel change reported.  Seeing neurology for headaches.  Appears to be stable.  Fever blister - recurrent.  Using topical.  Discussed oral rx.  Due colonoscopy.     Past Medical History:  Diagnosis Date   Anemia    Anxiety    Arthritis    Depression    Frequent headaches    H/O   GERD (gastroesophageal reflux disease)    Hypercholesterolemia    Placenta previa    Past Surgical History:  Procedure Laterality Date   ABDOMINAL EXPOSURE N/A 01/01/2018   Procedure: ABDOMINAL EXPOSURE;  Surgeon: Marty Heck, MD;  Location: [facility];  Service: Vascular;  Laterality: N/A;   ABDOMINAL HYSTERECTOMY     ANTERIOR LUMBAR FUSION N/A 01/01/2018   Procedure: ANTERIOR LUMBAR FUSION L5-S1; REPLACEMENT OF posterior HARDWARE L4-S1;  Surgeon: Melina Schools, MD;  Location: [place];  Service: Orthopedics;  Laterality: N/A;   BACK SURGERY     BREAST BIOPSY Right 05/10/2017   right breast stereotatic bx path pending   CESAREAN SECTION  2003 & 2010   CHOLECYSTECTOMY  2005   CYSTOSCOPY  09/11/2019   Procedure: CYSTOSCOPY;  Surgeon: Gae Dry, MD;  Location: [facility];  Service: Gynecology;;   LUMBAR LAMINECTOMY/DECOMPRESSION MICRODISCECTOMY N/A 12/28/2017   Procedure: L4-S1 decompression and fusion;  Surgeon: Melina Schools, MD;  Location: [place];   Service: Orthopedics;  Laterality: N/A;  4.5 hrs   SPINAL CORD STIMULATOR INSERTION N/A 10/30/2020   Procedure: SPINAL CORD STIMULATOR INSERTION;  Surgeon: Melina Schools, MD;  Location: [place];  Service: Orthopedics;  Laterality: N/A;   TOTAL LAPAROSCOPIC HYSTERECTOMY WITH SALPINGECTOMY Bilateral 09/11/2019   Procedure: TOTAL LAPAROSCOPIC HYSTERECTOMY WITH SALPINGECTOMY;  Surgeon: Gae Dry, MD;  Location: [facility];  Service: Gynecology;  Laterality: Bilateral;   Family History  Problem Relation Age of Onset   Hyperlipidemia Father    Diabetes Sister    Diabetes Maternal Grandmother    Cancer Maternal Grandmother        vaginal   Cirrhosis Maternal Grandmother    Colon polyps Maternal Grandmother    Breast cancer Maternal Grandmother        Early 79's   Social History   Socioeconomic History   Marital status: Married    Spouse name: Not on file   Number of children: 3   Years of education: Not on file   Highest education level: Not on file  Occupational History   Not on file  Tobacco Use   Smoking status: Former    Packs/day: 1.00    Types: Cigarettes   Smokeless tobacco: Never  Vaping Use   Vaping Use: Never used  Substance and Sexual Activity   Alcohol use: Not Currently    Comment: rarely  Drug use: No   Sexual activity: Not on file  Other Topics Concern   Not on file  Social History Narrative   Not on file   Social Determinants of Health   Financial Resource Strain: Low Risk  (08/20/2020)   Overall Financial Resource Strain (CARDIA)    Difficulty of Paying Living Expenses: Not hard at all  Food Insecurity: Not on file  Transportation Needs: Not on file  Physical Activity: Insufficiently Active (10/11/2019)   Exercise Vital Sign    Days of Exercise per Week: 3 days    Minutes of Exercise per Session: 30 min  Stress: Not on file  Social Connections: Not on file     Review of Systems  Constitutional:  Negative for appetite change and unexpected  weight change.  HENT:  Negative for congestion, sinus pressure and sore throat.   Eyes:  Negative for pain and visual disturbance.  Respiratory:  Negative for cough, chest tightness and shortness of breath.   Cardiovascular:  Negative for chest pain, palpitations and leg swelling.  Gastrointestinal:  Negative for abdominal pain, diarrhea, nausea and vomiting.  Genitourinary:  Negative for difficulty urinating and dysuria.  Musculoskeletal:  Negative for joint swelling and myalgias.  Skin:  Negative for color change and rash.  Neurological:  Negative for dizziness, light-headedness and headaches.  Hematological:  Negative for adenopathy. Does not bruise/bleed easily.  Psychiatric/Behavioral:  Negative for agitation and dysphoric mood.        Increased stress as outlined.        Objective:     BP 118/70   Pulse 89   Temp 97.9 F (36.6 C)   Resp 16   Ht '5\' 2"'$  (1.575 m)   Wt 150 lb 6.4 oz (68.2 kg)   SpO2 99%   BMI 27.51 kg/m  Wt Readings from Last 3 Encounters:  04/30/22 150 lb 6.4 oz (68.2 kg)  01/28/22 154 lb 3.2 oz (69.9 kg)  10/26/21 164 lb 6.4 oz (74.6 kg)    Physical Exam Vitals reviewed.  Constitutional:      General: She is not in acute distress.    Appearance: Normal appearance. She is well-developed.  HENT:     Head: Normocephalic and atraumatic.     Right Ear: External ear normal.     Left Ear: External ear normal.  Eyes:     General: No scleral icterus.       Right eye: No discharge.        Left eye: No discharge.     Conjunctiva/sclera: Conjunctivae normal.  Neck:     Thyroid: No thyromegaly.  Cardiovascular:     Rate and Rhythm: Normal rate and regular rhythm.  Pulmonary:     Effort: No tachypnea, accessory muscle usage or respiratory distress.     Breath sounds: Normal breath sounds. No decreased breath sounds or wheezing.  Chest:  Breasts:    Right: No inverted nipple, mass, nipple discharge or tenderness (no axillary adenopathy).     Left: No  inverted nipple, mass, nipple discharge or tenderness (no axilarry adenopathy).  Abdominal:     General: Bowel sounds are normal.     Palpations: Abdomen is soft.     Tenderness: There is no abdominal tenderness.  Genitourinary:    Comments: Normal external genitalia.  Vaginal vault without lesions.  S/p hysterectomy.  Pap smear performed.  Could not appreciate any adnexal masses or tenderness.   Musculoskeletal:        General: No swelling or  tenderness.     Cervical back: Neck supple.  Lymphadenopathy:     Cervical: No cervical adenopathy.  Skin:    Findings: No erythema or rash.  Neurological:     Mental Status: She is alert and oriented to person, place, and time.  Psychiatric:        Mood and Affect: Mood normal.        Behavior: Behavior normal.      Outpatient Encounter Medications as of 04/30/2022  Medication Sig   acyclovir (ZOVIRAX) 400 MG tablet Take one tablet tid x 5 days prn   amphetamine-dextroamphetamine (ADDERALL XR) 30 MG 24 hr capsule Take 30 mg by mouth daily.   amphetamine-dextroamphetamine (ADDERALL) 10 MG tablet Take 10 mg by mouth daily as needed (focus).   EMGALITY 120 MG/ML SOAJ Inject into the skin.   fexofenadine (ALLEGRA) 60 MG tablet Take 60 mg by mouth 2 (two) times daily.    LYSINE PO Take 1 tablet by mouth daily.   magnesium oxide (MAG-OX) 400 (240 Mg) MG tablet TAKE 1 TABLET BY MOUTH EVERY DAY   meclizine (ANTIVERT) 12.5 MG tablet Take 1 tablet (12.5 mg total) by mouth 2 (two) times daily as needed for dizziness.   pimecrolimus (ELIDEL) 1 % cream Apply topically 2 (two) times daily.   pregabalin (LYRICA) 150 MG capsule Take 100 mg by mouth at bedtime.   pregabalin (LYRICA) 50 MG capsule TAKE 1 CAPSULE (50 MG TOTAL) BY MOUTH 2 (TWO) TIMES DAILY.   rizatriptan (MAXALT) 10 MG tablet TAKE 1 TABLET (10 MG TOTAL) BY MOUTH DAILY AS NEEDED FOR MIGRAINE AS DIRECTED   Semaglutide-Weight Management (WEGOVY) 1.7 MG/0.75ML SOAJ Inject 1.7 mg into the skin once  a week.   Semaglutide-Weight Management (WEGOVY) 1.7 MG/0.75ML SOAJ Inject 1.7 mg into the skin once a week.   triamcinolone (KENALOG) 0.025 % cream Apply 1 application topically 2 (two) times daily.   venlafaxine XR (EFFEXOR-XR) 75 MG 24 hr capsule Take 75 mg by mouth daily.   zinc gluconate 50 MG tablet Take 50 mg by mouth daily.    No facility-administered encounter medications on file as of 04/30/2022.     Lab Results  Component Value Date   WBC 7.8 04/17/2021   HGB 13.1 04/17/2021   HCT 39.7 04/17/2021   PLT 370 04/17/2021   GLUCOSE 85 12/28/2021   CHOL 168 12/28/2021   TRIG 57.0 12/28/2021   HDL 66.50 12/28/2021   LDLDIRECT 132.6 01/29/2013   LDLCALC 90 12/28/2021   ALT 13 12/28/2021   AST 13 12/28/2021   NA 139 12/28/2021   K 4.2 12/28/2021   CL 104 12/28/2021   CREATININE 0.72 12/28/2021   BUN 6 12/28/2021   CO2 27 12/28/2021   TSH 1.46 04/17/2021   INR 0.9 10/27/2020    MM 3D SCREEN BREAST BILATERAL  Result Date: 11/11/2021 CLINICAL DATA:  Screening. EXAM: DIGITAL SCREENING BILATERAL MAMMOGRAM WITH TOMOSYNTHESIS AND CAD TECHNIQUE: Bilateral screening digital craniocaudal and mediolateral oblique mammograms were obtained. Bilateral screening digital breast tomosynthesis was performed. The images were evaluated with computer-aided detection. COMPARISON:  Previous exam(s). ACR Breast Density Category c: The breast tissue is heterogeneously dense, which may obscure small masses. FINDINGS: There are no findings suspicious for malignancy. IMPRESSION: No mammographic evidence of malignancy. A result letter of this screening mammogram will be mailed directly to the patient. RECOMMENDATION: Screening mammogram in one year. (Code:SM-B-01Y) BI-RADS CATEGORY  1: Negative. Electronically Signed   By: Lillia Mountain M.D.   On:  11/11/2021 10:57       Assessment & Plan:  Health care maintenance Assessment & Plan: Physical today 04/30/22.  PAP 04/30/22.  Mammogram 11/10/21 - Briads II.  Due  colonoscopy.  Refer to GI.    Hypercholesterolemia Assessment & Plan: TRRNHA.  Has lost weight.  Watching diet.  Follow lipid panel.   Orders: -     CBC with Differential/Platelet; Future -     Comprehensive metabolic panel; Future -     Lipid panel; Future -     TSH; Future  Screening for cervical cancer -     Cytology - PAP  GAD (generalized anxiety disorder) Assessment & Plan: Being followed by Dr Nicolasa Ducking.  On effexor.  '150mg'$  did help.  Follow.    Nonintractable headache, unspecified chronicity pattern, unspecified headache type Assessment & Plan: Seeing neurology for migraines.  Recommended continuing emgality.  Has maxalt to take prn.  Headaches are better.    Chronic low back pain with sciatica, sciatica laterality unspecified, unspecified back pain laterality Assessment & Plan: Has a history of low back pain/leg pain - has seen ortho.  Spinal cord stimulator 11/2020.  On lyrica.  Discussed pain clinic.  Will notify me if desires further intervention.    Colon cancer screening Assessment & Plan: Age 45. Due colonoscopy.  Refer to GI.   Orders: -     Ambulatory referral to Gastroenterology  Herpes labialis Assessment & Plan: Recurring fever blisters.  Acyclovir as directed prn.  Follow.    Other orders -     Acyclovir; Take one tablet tid x 5 days prn  Dispense: 90 tablet; Refill: 0     [name], MD

## 2022-05-01 ENCOUNTER — Encounter: Payer: Self-pay | Admitting: Internal Medicine

## 2022-05-01 DIAGNOSIS — B001 Herpesviral vesicular dermatitis: Secondary | ICD-10-CM | POA: Insufficient documentation

## 2022-05-01 DIAGNOSIS — Z1211 Encounter for screening for malignant neoplasm of colon: Secondary | ICD-10-CM | POA: Insufficient documentation

## 2022-05-01 NOTE — Assessment & Plan Note (Signed)
Seeing neurology for migraines.  Recommended continuing emgality.  Has maxalt to take prn.  Headaches are better.

## 2022-05-01 NOTE — Assessment & Plan Note (Signed)
Age 47. Due colonoscopy.  Refer to GI.

## 2022-05-01 NOTE — Assessment & Plan Note (Signed)
Wegovy.  Has lost weight.  Watching diet.  Follow lipid panel.

## 2022-05-01 NOTE — Assessment & Plan Note (Signed)
Recurring fever blisters.  Acyclovir as directed prn.  Follow.

## 2022-05-01 NOTE — Assessment & Plan Note (Signed)
Has a history of low back pain/leg pain - has seen ortho.  Spinal cord stimulator 11/2020.  On lyrica.  Discussed pain clinic.  Will notify me if desires further intervention.

## 2022-05-01 NOTE — Assessment & Plan Note (Signed)
Being followed by Dr Nicolasa Ducking.  On effexor.  '150mg'$  did help.  Follow.

## 2022-05-05 LAB — CYTOLOGY - PAP
Comment: NEGATIVE
Diagnosis: NEGATIVE
High risk HPV: NEGATIVE

## 2022-05-14 ENCOUNTER — Encounter: Payer: Self-pay | Admitting: Internal Medicine

## 2022-05-17 NOTE — Telephone Encounter (Signed)
Treated with monistat 3 05/06/22 and then was placed on abx for UTI which typically cause her to get yeast infections. Did not feel like yeast infection ever cleared initially. Can we send something in for her?

## 2022-05-17 NOTE — Telephone Encounter (Signed)
Confirm NKDA.  If no, then can treat with diflucan 115m x 1.  If persistent symptoms, then may repeat x 1 in 3 days if symptoms persist.  Send in #2 tablets with no refills.

## 2022-05-17 NOTE — Telephone Encounter (Signed)
LMTCB

## 2022-05-19 NOTE — Telephone Encounter (Signed)
LMTCB

## 2022-05-25 ENCOUNTER — Other Ambulatory Visit: Payer: Self-pay | Admitting: Internal Medicine

## 2022-05-25 NOTE — Telephone Encounter (Signed)
Spoke with patient, per patient symptoms have resolved. Diflucan not needed. Will reach out if symptoms return

## 2022-06-08 ENCOUNTER — Encounter: Payer: Self-pay | Admitting: Internal Medicine

## 2022-06-09 NOTE — Telephone Encounter (Signed)
With her not having a history of diabetes, wegovy and zepbound are the medications we use and insurance may not cover for weight loss.

## 2022-07-13 ENCOUNTER — Encounter: Payer: Self-pay | Admitting: Internal Medicine

## 2022-07-16 ENCOUNTER — Encounter: Payer: Self-pay | Admitting: Family Medicine

## 2022-07-16 ENCOUNTER — Ambulatory Visit: Payer: BC Managed Care – PPO | Admitting: Family Medicine

## 2022-07-16 VITALS — BP 90/60 | HR 85 | Temp 98.1°F | Ht 62.0 in | Wt 150.5 lb

## 2022-07-16 DIAGNOSIS — H6992 Unspecified Eustachian tube disorder, left ear: Secondary | ICD-10-CM

## 2022-07-16 DIAGNOSIS — R42 Dizziness and giddiness: Secondary | ICD-10-CM | POA: Diagnosis not present

## 2022-07-16 MED ORDER — PREDNISONE 20 MG PO TABS: ORAL_TABLET | ORAL | 0 refills | Status: DC

## 2022-07-16 NOTE — Patient Instructions (Addendum)
Can try flonase 2 sprays per nostril TWICE daily if you cannot tolerate prednisone course.  Try changing to zyrtec or Xyzal.  Can try a  decongestant nasal or oral.  If not improving .Marland Kitchen Follow with ENT.

## 2022-07-16 NOTE — Assessment & Plan Note (Signed)
Most likely cause of acute symptoms not clearly because of her chronic intermittent lightheadedness given this is not clearly vertigo.  She has side effects of irritability with prednisone so we will treat with a prednisone taper lower dose 40 mg for 5 days followed by 20 mg for 5 days.  If she cannot tolerate that she could try an increased dose of Flonase 2 sprays per nostril twice daily.  She will also consider changing up her antihistamine to cover for an allergic trigger as well as adding a decongestant.  If not improving as expected she will follow-up with ENT or her PCP.  Return and ER precautions provided.

## 2022-07-16 NOTE — Progress Notes (Signed)
Patient ID: Ariel Morgan, female    DOB: 07-Jul-1975, 47 y.o.   MRN: [id number]  This visit was conducted in person.  BP 90/60   Pulse 85   Temp 98.1 F (36.7 C) (Temporal)   Ht 5\' 2"  (1.575 m)   Wt 150 lb 8 oz (68.3 kg)   BMI 27.53 kg/m    CC:  Chief Complaint  Patient presents with   Dizziness   Headache   Ears Fluttering    Subjective:   HPI: Ariel Morgan is a 47 y.o. female patient of Dr. Lorin Picket with NO history of asthma or COPD presenting on 07/16/2022 for Dizziness, Headache, and Ears Fluttering  She reports recurrent onset  dizziness in last 2 weeks, intermittently. Not vertigo but feel lightheaded. She has also noted several months of fluttering in left  ear.. popping or sounds like butterfly in left ear... seems to have spread to right ear in last week... sounds like bubbles popping.    Lying down helps with dizziness.   Has seen ENT for dizziness after it lasted 4-5 months... last year.. has this issue come and go... negative [employer].  Used meclizine prn  Recently in last week has used meclizine prn and has cleaned out ears... caused balance issue and nausea.   Has allergies... using flonase 2 sprays per nostril and antihistamine.  Has noted sinus pressure ( has history of migraines) No hearing change.   No fever.    Relevant past medical, surgical, family and social history reviewed and updated as indicated. Interim medical history since our last visit reviewed. Allergies and medications reviewed and updated. Outpatient Medications Prior to Visit  Medication Sig Dispense Refill   acyclovir (ZOVIRAX) 400 MG tablet TAKE ONE TABLET 3 TIMES A DAY X 5 DAYS AS NEEDED 270 tablet 1   amphetamine-dextroamphetamine (ADDERALL XR) 30 MG 24 hr capsule Take 30 mg by mouth daily.     amphetamine-dextroamphetamine (ADDERALL) 10 MG tablet Take 10 mg by mouth daily as needed (focus).     EMGALITY 120 MG/ML SOAJ Inject into the skin.     fexofenadine  (ALLEGRA) 60 MG tablet Take 60 mg by mouth 2 (two) times daily.      LYSINE PO Take 1 tablet by mouth daily.     magnesium oxide (MAG-OX) 400 (240 Mg) MG tablet TAKE 1 TABLET BY MOUTH EVERY DAY 30 tablet 2   pimecrolimus (ELIDEL) 1 % cream Apply topically 2 (two) times daily. 30 g 0   pregabalin (LYRICA) 100 MG capsule Take 100 mg by mouth 2 (two) times daily.     rizatriptan (MAXALT) 10 MG tablet TAKE 1 TABLET (10 MG TOTAL) BY MOUTH DAILY AS NEEDED FOR MIGRAINE AS DIRECTED 10 tablet 0   Semaglutide-Weight Management (WEGOVY) 1.7 MG/0.75ML SOAJ Inject 1.7 mg into the skin once a week. 3 mL 3   traZODone (DESYREL) 50 MG tablet Take 50-100 mg by mouth at bedtime as needed.     venlafaxine XR (EFFEXOR-XR) 150 MG 24 hr capsule Take 150 mg by mouth every morning.     zinc gluconate 50 MG tablet Take 50 mg by mouth daily.      meclizine (ANTIVERT) 12.5 MG tablet Take 1 tablet (12.5 mg total) by mouth 2 (two) times daily as needed for dizziness. 14 tablet 0   pregabalin (LYRICA) 150 MG capsule Take 100 mg by mouth at bedtime.     pregabalin (LYRICA) 50 MG capsule TAKE 1 CAPSULE (50 MG  TOTAL) BY MOUTH 2 (TWO) TIMES DAILY. 60 capsule 1   Semaglutide-Weight Management (WEGOVY) 1.7 MG/0.75ML SOAJ Inject 1.7 mg into the skin once a week. 3 mL 2   triamcinolone (KENALOG) 0.025 % cream Apply 1 application topically 2 (two) times daily.     venlafaxine XR (EFFEXOR-XR) 75 MG 24 hr capsule Take 75 mg by mouth daily.     No facility-administered medications prior to visit.     Per HPI unless specifically indicated in ROS section below Review of Systems  Constitutional:  Negative for fatigue and fever.  HENT:  Negative for congestion.   Eyes:  Negative for pain.  Respiratory:  Negative for cough and shortness of breath.   Cardiovascular:  Negative for chest pain, palpitations and leg swelling.  Gastrointestinal:  Negative for abdominal pain.  Genitourinary:  Negative for dysuria and vaginal bleeding.   Musculoskeletal:  Negative for back pain.  Neurological:  Negative for syncope, light-headedness and headaches.  Psychiatric/Behavioral:  Negative for dysphoric mood.    Objective:  BP 90/60   Pulse 85   Temp 98.1 F (36.7 C) (Temporal)   Ht  (1.575 m)   Wt 150 lb 8 oz (68.3 kg)   BMI 27.53 kg/m   Wt Readings from Last 3 Encounters:  07/16/22 150 lb 8 oz (68.3 kg)  04/30/22 150 lb 6.4 oz (68.2 kg)  01/28/22 154 lb 3.2 oz (69.9 kg)      Physical Exam Constitutional:      General: She is not in acute distress.    Appearance: Normal appearance. She is well-developed. She is not ill-appearing or toxic-appearing.  HENT:     Head: Normocephalic.     Right Ear: Hearing, tympanic membrane, ear canal and external ear normal. Tympanic membrane is not erythematous, retracted or bulging.     Left Ear: Hearing, ear canal and external ear normal. A middle ear effusion is present. Tympanic membrane is not erythematous, retracted or bulging.     Nose: No mucosal edema or rhinorrhea.     Right Sinus: No maxillary sinus tenderness or frontal sinus tenderness.     Left Sinus: No maxillary sinus tenderness or frontal sinus tenderness.     Mouth/Throat:     Pharynx: Uvula midline.  Eyes:     General: Lids are normal. Lids are everted, no foreign bodies appreciated.     Conjunctiva/sclera: Conjunctivae normal.     Pupils: Pupils are equal, round, and reactive to light.  Neck:     Thyroid: No thyroid mass or thyromegaly.     Vascular: No carotid bruit.     Trachea: Trachea normal.  Cardiovascular:     Rate and Rhythm: Normal rate and regular rhythm.     Pulses: Normal pulses.     Heart sounds: Normal heart sounds, S1 normal and S2 normal. No murmur heard.    No friction rub. No gallop.  Pulmonary:     Effort: Pulmonary effort is normal. No tachypnea or respiratory distress.     Breath sounds: Normal breath sounds. No decreased breath sounds, wheezing, rhonchi or rales.  Abdominal:      General: Bowel sounds are normal.     Palpations: Abdomen is soft.     Tenderness: There is no abdominal tenderness.  Musculoskeletal:     Cervical back: Normal range of motion and neck supple.  Skin:    General: Skin is warm and dry.     Findings: No rash.  Neurological:     Mental  Status: She is alert.  Psychiatric:        Mood and Affect: Mood is not anxious or depressed.        Speech: Speech normal.        Behavior: Behavior normal. Behavior is cooperative.        Thought Content: Thought content normal.        Judgment: Judgment normal.       Results for orders placed or performed in visit on 04/30/22  Cytology - PAP( [place])  Result Value Ref Range   High risk HPV Negative    Adequacy Satisfactory for evaluation.    Diagnosis      - Negative for intraepithelial lesion or malignancy (NILM)   Comment Normal Reference Range HPV - Negative    Microorganisms      Fungal organisms present consistent with Candida spp.    Assessment and Plan  ETD (Eustachian tube dysfunction), left Assessment & Plan: Most likely cause of acute symptoms not clearly because of her chronic intermittent lightheadedness given this is not clearly vertigo.  She has side effects of irritability with prednisone so we will treat with a prednisone taper lower dose 40 mg for 5 days followed by 20 mg for 5 days.  If she cannot tolerate that she could try an increased dose of Flonase 2 sprays per nostril twice daily.  She will also consider changing up her antihistamine to cover for an allergic trigger as well as adding a decongestant.  If not improving as expected she will follow-up with ENT or her PCP.  Return and ER precautions provided.   Lightheadedness Assessment & Plan: Chronic, unclear cause Negative ENT workup in the past although meclizine seems to help.  If persistent intermittent issue she will follow-up with her PCP   Other orders -     predniSONE; 2 tabs by mouth daily x 5  days,  then 1 tab by mouth daily x 5 days  Dispense: 15 tablet; Refill: 0    No follow-ups on file.   Kerby Nora, MD

## 2022-07-16 NOTE — Assessment & Plan Note (Addendum)
Chronic, unclear cause Negative ENT workup in the past although meclizine seems to help.  If persistent intermittent issue she will follow-up with her PCP

## 2022-07-30 ENCOUNTER — Ambulatory Visit: Payer: BC Managed Care – PPO | Admitting: Internal Medicine

## 2022-08-12 ENCOUNTER — Other Ambulatory Visit: Payer: Self-pay | Admitting: Internal Medicine

## 2022-08-12 NOTE — Telephone Encounter (Signed)
Per chart last refill 01/29/22, #30, 2 refills.  Based on this patient may not be taking as directed.   LVM to inquire if patient has been purchasing OTC or how she has been taking this rx.

## 2022-08-18 NOTE — Telephone Encounter (Signed)
Per patient she has not been taking this daily, she states she "has a lot of it right now" and does not need a refill at this time. Patient advised to let pharmacy or office know when she does need. Nothing further needed at this time.

## 2022-09-26 ENCOUNTER — Encounter: Payer: Self-pay | Admitting: Internal Medicine

## 2022-10-25 ENCOUNTER — Other Ambulatory Visit (INDEPENDENT_AMBULATORY_CARE_PROVIDER_SITE_OTHER): Payer: BC Managed Care – PPO

## 2022-10-25 DIAGNOSIS — E7800 Pure hypercholesterolemia, unspecified: Secondary | ICD-10-CM

## 2022-10-25 LAB — COMPREHENSIVE METABOLIC PANEL
ALT: 14 U/L (ref 0–35)
AST: 15 U/L (ref 0–37)
Albumin: 3.9 g/dL (ref 3.5–5.2)
Alkaline Phosphatase: 70 U/L (ref 39–117)
BUN: 11 mg/dL (ref 6–23)
CO2: 28 mEq/L (ref 19–32)
Calcium: 9.1 mg/dL (ref 8.4–10.5)
Chloride: 106 mEq/L (ref 96–112)
Creatinine, Ser: 0.67 mg/dL (ref 0.40–1.20)
GFR: 104.56 mL/min (ref >60.00)
Glucose, Bld: 89 mg/dL (ref 70–99)
Potassium: 4.1 mEq/L (ref 3.5–5.1)
Sodium: 141 mEq/L (ref 135–145)
Total Bilirubin: 0.6 mg/dL (ref 0.2–1.2)
Total Protein: 6.3 g/dL (ref 6.0–8.3)

## 2022-10-25 LAB — LIPID PANEL
Cholesterol: 237 mg/dL — ABNORMAL HIGH (ref 0–200)
HDL: 90.2 mg/dL (ref >39.00)
LDL Cholesterol: 130 mg/dL — ABNORMAL HIGH (ref 0–99)
NonHDL: 146.87
Total CHOL/HDL Ratio: 3
Triglycerides: 82 mg/dL (ref 0.0–149.0)
VLDL: 16.4 mg/dL (ref 0.0–40.0)

## 2022-10-25 LAB — CBC WITH DIFFERENTIAL/PLATELET
Basophils Absolute: 0.1 10*3/uL (ref 0.0–0.1)
Basophils Relative: 2 % (ref 0.0–3.0)
Eosinophils Absolute: 0.2 10*3/uL (ref 0.0–0.7)
Eosinophils Relative: 2.8 % (ref 0.0–5.0)
HCT: 40.9 % (ref 36.0–46.0)
Hemoglobin: 13.1 g/dL (ref 12.0–15.0)
Lymphocytes Relative: 26.8 % (ref 12.0–46.0)
Lymphs Abs: 1.8 10*3/uL (ref 0.7–4.0)
MCHC: 32.2 g/dL (ref 30.0–36.0)
MCV: 92.1 fl (ref 78.0–100.0)
Monocytes Absolute: 0.5 10*3/uL (ref 0.1–1.0)
Monocytes Relative: 7.1 % (ref 3.0–12.0)
Neutro Abs: 4 10*3/uL (ref 1.4–7.7)
Neutrophils Relative %: 61.3 % (ref 43.0–77.0)
Platelets: 339 10*3/uL (ref 150.0–400.0)
RBC: 4.43 Mil/uL (ref 3.87–5.11)
RDW: 13 % (ref 11.5–15.5)
WBC: 6.5 10*3/uL (ref 4.0–10.5)

## 2022-10-25 LAB — TSH: TSH: 1.19 u[IU]/mL (ref 0.35–5.50)

## 2022-10-28 ENCOUNTER — Ambulatory Visit: Payer: BC Managed Care – PPO | Admitting: Internal Medicine

## 2022-10-28 ENCOUNTER — Encounter: Payer: Self-pay | Admitting: Internal Medicine

## 2022-10-28 VITALS — BP 118/74 | HR 90 | Temp 97.9°F | Resp 16 | Ht 62.0 in | Wt 170.0 lb

## 2022-10-28 DIAGNOSIS — F411 Generalized anxiety disorder: Secondary | ICD-10-CM | POA: Diagnosis not present

## 2022-10-28 DIAGNOSIS — E7800 Pure hypercholesterolemia, unspecified: Secondary | ICD-10-CM | POA: Diagnosis not present

## 2022-10-28 DIAGNOSIS — N200 Calculus of kidney: Secondary | ICD-10-CM

## 2022-10-28 DIAGNOSIS — R319 Hematuria, unspecified: Secondary | ICD-10-CM | POA: Diagnosis not present

## 2022-10-28 DIAGNOSIS — M5440 Lumbago with sciatica, unspecified side: Secondary | ICD-10-CM

## 2022-10-28 DIAGNOSIS — G8929 Other chronic pain: Secondary | ICD-10-CM

## 2022-10-28 LAB — URINALYSIS, ROUTINE W REFLEX MICROSCOPIC
Bilirubin Urine: NEGATIVE
Hgb urine dipstick: NEGATIVE
Ketones, ur: NEGATIVE
Nitrite: NEGATIVE
Specific Gravity, Urine: 1.015 (ref 1.000–1.030)
Total Protein, Urine: NEGATIVE
Urine Glucose: NEGATIVE
Urobilinogen, UA: 0.2 (ref 0.0–1.0)
pH: 6.5 (ref 5.0–8.0)

## 2022-10-28 NOTE — Progress Notes (Signed)
Subjective:    Patient ID: Ariel Morgan, female    DOB: Jul 17, 1975, 47 y.o.   MRN: [id number]  Patient here for  Chief Complaint  Patient presents with   Medical Management of Chronic Issues    HPI Here to follow up regarding increased stress.  Seeing Dr Maryruth Bun.  Taking effexor.  Sees neurology for f/u migraine headaches.  Continuing on emgality.  Has maxalt to take prn.  Chronic low back/leg pain.  Has seen ortho.  Spinal cord stimulator 11/2020.  On lyrica. Still with pain around the site.  Plans to f/u to discuss.  Sees Dr Sherryll Burger for headaches.  Has f/u in 11/2022.  Does have questions regarding prevention - dementia. Discussed with her regarding Dr Margaretmary Eddy research study.  Tries to stay active.  Has not been exercising as much.  Is planning to start yoga/pilates, etc.  Discussed diet and exercise.  Off wegovy.  No chest pain or sob reported.  No abdominal pain.  Was seen in 09/2022 [facility] ER - diagnosed with kidney stone.  Thinks she passed the stone.  No further pain.  Does notice intermittent - discomfort - bladder - pressure.  Some question of dysuria.  No vaginal symptoms.    Past Medical History:  Diagnosis Date   Anemia    Anxiety    Arthritis    Depression    Frequent headaches    H/O   GERD (gastroesophageal reflux disease)    Hypercholesterolemia    Placenta previa    Past Surgical History:  Procedure Laterality Date   ABDOMINAL EXPOSURE N/A 01/01/2018   Procedure: ABDOMINAL EXPOSURE;  Surgeon: Cephus Shelling, MD;  Location: [facility] OR;  Service: Vascular;  Laterality: N/A;   ABDOMINAL HYSTERECTOMY     ANTERIOR LUMBAR FUSION N/A 01/01/2018   Procedure: ANTERIOR LUMBAR FUSION L5-S1; REPLACEMENT OF posterior HARDWARE L4-S1;  Surgeon: Venita Lick, MD;  Location: [facility] OR;  Service: Orthopedics;  Laterality: N/A;   BACK SURGERY     BREAST BIOPSY Right 05/10/2017   right breast stereotatic bx path pending   CESAREAN SECTION  2003 & 2010   CHOLECYSTECTOMY  2005    CYSTOSCOPY  09/11/2019   Procedure: CYSTOSCOPY;  Surgeon: Nadara Mustard, MD;  Location: [facility] ORS;  Service: Gynecology;;   LUMBAR LAMINECTOMY/DECOMPRESSION MICRODISCECTOMY N/A 12/28/2017   Procedure: L4-S1 decompression and fusion;  Surgeon: Venita Lick, MD;  Location: [facility] OR;  Service: Orthopedics;  Laterality: N/A;  4.5 hrs   SPINAL CORD STIMULATOR INSERTION N/A 10/30/2020   Procedure: SPINAL CORD STIMULATOR INSERTION;  Surgeon: Venita Lick, MD;  Location: [facility] OR;  Service: Orthopedics;  Laterality: N/A;   TOTAL LAPAROSCOPIC HYSTERECTOMY WITH SALPINGECTOMY Bilateral 09/11/2019   Procedure: TOTAL LAPAROSCOPIC HYSTERECTOMY WITH SALPINGECTOMY;  Surgeon: Nadara Mustard, MD;  Location: [facility] ORS;  Service: Gynecology;  Laterality: Bilateral;   Family History  Problem Relation Age of Onset   Hyperlipidemia Father    Diabetes Sister    Diabetes Maternal Grandmother    Cancer Maternal Grandmother        vaginal   Cirrhosis Maternal Grandmother    Colon polyps Maternal Grandmother    Breast cancer Maternal Grandmother        Early 51's   Social History   Socioeconomic History   Marital status: Married    Spouse name: Not on file   Number of children: 3   Years of education: Not on file   Highest education level: Not on file  Occupational History  Not on file  Tobacco Use   Smoking status: Former    Current packs/day: 1.00    Types: Cigarettes   Smokeless tobacco: Never  Vaping Use   Vaping status: Never Used  Substance and Sexual Activity   Alcohol use: Not Currently    Comment: rarely   Drug use: No   Sexual activity: Not on file  Other Topics Concern   Not on file  Social History Narrative   Not on file   Social Determinants of Health   Financial Resource Strain: Low Risk  (08/20/2020)   Overall Financial Resource Strain (CARDIA)    Difficulty of Paying Living Expenses: Not hard at all  Food Insecurity: Not on file  Transportation Needs: Not on file  Physical  Activity: Insufficiently Active (10/11/2019)   Exercise Vital Sign    Days of Exercise per Week: 3 days    Minutes of Exercise per Session: 30 min  Stress: Not on file  Social Connections: Not on file     Review of Systems  Constitutional:  Negative for appetite change and unexpected weight change.  HENT:  Negative for congestion and sinus pressure.   Respiratory:  Negative for cough, chest tightness and shortness of breath.   Cardiovascular:  Negative for chest pain, palpitations and leg swelling.  Gastrointestinal:  Negative for abdominal pain, diarrhea, nausea and vomiting.  Genitourinary:  Positive for dysuria. Negative for difficulty urinating.  Musculoskeletal:  Positive for back pain. Negative for joint swelling and myalgias.  Skin:  Negative for color change and rash.  Neurological:  Negative for dizziness and headaches.  Psychiatric/Behavioral:  Negative for agitation and dysphoric mood.        Objective:     BP 118/74   Pulse 90   Temp 97.9 F (36.6 C)   Resp 16   Ht 5\' 2"  (1.575 m)   Wt 170 lb (77.1 kg)   SpO2 98%   BMI 31.09 kg/m  Wt Readings from Last 3 Encounters:  10/28/22 170 lb (77.1 kg)  07/16/22 150 lb 8 oz (68.3 kg)  04/30/22 150 lb 6.4 oz (68.2 kg)    Physical Exam Vitals reviewed.  Constitutional:      General: She is not in acute distress.    Appearance: Normal appearance.  HENT:     Head: Normocephalic and atraumatic.     Right Ear: External ear normal.     Left Ear: External ear normal.  Eyes:     General: No scleral icterus.       Right eye: No discharge.        Left eye: No discharge.     Conjunctiva/sclera: Conjunctivae normal.  Neck:     Thyroid: No thyromegaly.  Cardiovascular:     Rate and Rhythm: Normal rate and regular rhythm.  Pulmonary:     Effort: No respiratory distress.     Breath sounds: Normal breath sounds. No wheezing.  Abdominal:     General: Bowel sounds are normal.     Palpations: Abdomen is soft.      Tenderness: There is no abdominal tenderness.  Musculoskeletal:        General: No swelling or tenderness.     Cervical back: Neck supple. No tenderness.  Lymphadenopathy:     Cervical: No cervical adenopathy.  Skin:    Findings: No erythema or rash.  Neurological:     Mental Status: She is alert.  Psychiatric:        Mood and Affect: Mood normal.  Behavior: Behavior normal.      Outpatient Encounter Medications as of 10/28/2022  Medication Sig   acyclovir (ZOVIRAX) 400 MG tablet TAKE ONE TABLET 3 TIMES A DAY X 5 DAYS AS NEEDED   amphetamine-dextroamphetamine (ADDERALL XR) 30 MG 24 hr capsule Take 30 mg by mouth daily.   amphetamine-dextroamphetamine (ADDERALL) 10 MG tablet Take 10 mg by mouth daily as needed (focus).   EMGALITY 120 MG/ML SOAJ Inject into the skin.   fexofenadine (ALLEGRA) 60 MG tablet Take 60 mg by mouth 2 (two) times daily.    LYSINE PO Take 1 tablet by mouth daily.   magnesium oxide (MAG-OX) 400 (240 Mg) MG tablet TAKE 1 TABLET BY MOUTH EVERY DAY   pimecrolimus (ELIDEL) 1 % cream Apply topically 2 (two) times daily.   pregabalin (LYRICA) 100 MG capsule Take 100 mg by mouth 2 (two) times daily.   rizatriptan (MAXALT) 10 MG tablet TAKE 1 TABLET (10 MG TOTAL) BY MOUTH DAILY AS NEEDED FOR MIGRAINE AS DIRECTED   Semaglutide-Weight Management (WEGOVY) 1.7 MG/0.75ML SOAJ Inject 1.7 mg into the skin once a week.   traZODone (DESYREL) 50 MG tablet Take 50-100 mg by mouth at bedtime as needed.   venlafaxine XR (EFFEXOR-XR) 150 MG 24 hr capsule Take 150 mg by mouth every morning.   zinc gluconate 50 MG tablet Take 50 mg by mouth daily.    [DISCONTINUED] predniSONE (DELTASONE) 20 MG tablet 2 tabs by mouth daily x 5 days,  then 1 tab by mouth daily x 5 days   No facility-administered encounter medications on file as of 10/28/2022.     Lab Results  Component Value Date   WBC 6.5 10/25/2022   HGB 13.1 10/25/2022   HCT 40.9 10/25/2022   PLT 339.0 10/25/2022    GLUCOSE 89 10/25/2022   CHOL 237 (H) 10/25/2022   TRIG 82.0 10/25/2022   HDL 90.20 10/25/2022   LDLDIRECT 132.6 01/29/2013   LDLCALC 130 (H) 10/25/2022   ALT 14 10/25/2022   AST 15 10/25/2022   NA 141 10/25/2022   K 4.1 10/25/2022   CL 106 10/25/2022   CREATININE 0.67 10/25/2022   BUN 11 10/25/2022   CO2 28 10/25/2022   TSH 1.19 10/25/2022   INR 0.9 10/27/2020       Assessment & Plan:  Hypercholesterolemia Assessment & Plan: The 10-year ASCVD risk score (Arnett DK, et al., 2019) is: 0.4%   Values used to calculate the score:     Age: 16 years     Sex: Female     Is Non-Hispanic African American: No     Diabetic: No     Tobacco smoker: No     Systolic Blood Pressure: 110 mmHg     Is BP treated: No     HDL Cholesterol: 90.2 mg/dL     Total Cholesterol: 237 mg/dL  Low cholesterol diet and exercise.  Follow lipid panel.    Hematuria, unspecified type -     Urinalysis, Routine w reflex microscopic -     Urine Culture  GAD (generalized anxiety disorder) Assessment & Plan: Being followed by Dr Maryruth Bun.  On effexor. Stable.  Follow.    Chronic low back pain with sciatica, sciatica laterality unspecified, unspecified back pain laterality Assessment & Plan: Has a history of low back pain/leg pain - has seen ortho.  Spinal cord stimulator 11/2020.  On lyrica.     Kidney stone Assessment & Plan: Was seen in 09/2022 [facility] ER - diagnosed with kidney  stone.  Thinks she passed the stone.  No further pain.  Does notice intermittent - discomfort - bladder - pressure.  Some question of dysuria. Check urinalysis and culture.  Confirm no infection. Consider referral to urology for further evaluation.       [place], MD

## 2022-10-28 NOTE — Assessment & Plan Note (Addendum)
The 10-year ASCVD risk score (Arnett DK, et al., 2019) is: 0.4%   Values used to calculate the score:     Age: 47 years     Sex: Female     Is Non-Hispanic African American: No     Diabetic: No     Tobacco smoker: No     Systolic Blood Pressure: 110 mmHg     Is BP treated: No     HDL Cholesterol: 90.2 mg/dL     Total Cholesterol: 237 mg/dL  Low cholesterol diet and exercise.  Follow lipid panel.

## 2022-10-28 NOTE — Patient Instructions (Signed)
  [email or web address]

## 2022-10-29 ENCOUNTER — Encounter: Payer: Self-pay | Admitting: Internal Medicine

## 2022-10-29 DIAGNOSIS — N200 Calculus of kidney: Secondary | ICD-10-CM | POA: Insufficient documentation

## 2022-10-29 LAB — URINE CULTURE: MICRO NUMBER:: 15246296

## 2022-10-29 NOTE — Assessment & Plan Note (Signed)
Being followed by Dr Maryruth Bun.  On effexor. Stable.  Follow.

## 2022-10-29 NOTE — Assessment & Plan Note (Signed)
Was seen in 09/2022 [facility] ER - diagnosed with kidney stone.  Thinks she passed the stone.  No further pain.  Does notice intermittent - discomfort - bladder - pressure.  Some question of dysuria. Check urinalysis and culture.  Confirm no infection. Consider referral to urology for further evaluation.

## 2022-10-29 NOTE — Assessment & Plan Note (Signed)
Has a history of low back pain/leg pain - has seen ortho.  Spinal cord stimulator 11/2020.  On lyrica.

## 2022-11-01 ENCOUNTER — Other Ambulatory Visit: Payer: Self-pay | Admitting: Internal Medicine

## 2022-11-01 DIAGNOSIS — N200 Calculus of kidney: Secondary | ICD-10-CM

## 2022-11-01 NOTE — Progress Notes (Signed)
Order placed for urology referral.  

## 2022-11-03 ENCOUNTER — Ambulatory Visit: Payer: BC Managed Care – PPO

## 2022-11-03 DIAGNOSIS — Z1211 Encounter for screening for malignant neoplasm of colon: Secondary | ICD-10-CM

## 2022-11-03 DIAGNOSIS — K6289 Other specified diseases of anus and rectum: Secondary | ICD-10-CM | POA: Diagnosis not present

## 2022-11-04 ENCOUNTER — Encounter: Payer: Self-pay | Admitting: Internal Medicine

## 2022-11-23 ENCOUNTER — Ambulatory Visit
Admission: RE | Admit: 2022-11-23 | Discharge: 2022-11-23 | Disposition: A | Discharge status: Home / Self Care | Payer: BC Managed Care – PPO | Attending: Urology | Admitting: Urology

## 2022-11-23 ENCOUNTER — Ambulatory Visit
Admission: RE | Admit: 2022-11-23 | Discharge: 2022-11-23 | Disposition: A | Discharge status: Home / Self Care | Payer: BC Managed Care – PPO | Source: Ambulatory Visit | Attending: Urology | Admitting: Urology

## 2022-11-23 ENCOUNTER — Ambulatory Visit (INDEPENDENT_AMBULATORY_CARE_PROVIDER_SITE_OTHER): Payer: BC Managed Care – PPO | Admitting: Urology

## 2022-11-23 ENCOUNTER — Other Ambulatory Visit
Admission: RE | Admit: 2022-11-23 | Discharge: 2022-11-23 | Disposition: A | Discharge status: Home / Self Care | Payer: BC Managed Care – PPO | Source: Home / Self Care | Attending: Urology | Admitting: Urology

## 2022-11-23 ENCOUNTER — Encounter: Payer: Self-pay | Admitting: Urology

## 2022-11-23 VITALS — BP 127/87 | HR 105 | Ht 62.0 in | Wt 168.0 lb

## 2022-11-23 DIAGNOSIS — N201 Calculus of ureter: Secondary | ICD-10-CM | POA: Diagnosis not present

## 2022-11-23 DIAGNOSIS — R300 Dysuria: Secondary | ICD-10-CM | POA: Diagnosis not present

## 2022-11-23 DIAGNOSIS — N200 Calculus of kidney: Secondary | ICD-10-CM

## 2022-11-23 DIAGNOSIS — R399 Unspecified symptoms and signs involving the genitourinary system: Secondary | ICD-10-CM

## 2022-11-23 LAB — URINALYSIS, COMPLETE (UACMP) WITH MICROSCOPIC
Bilirubin Urine: NEGATIVE
Glucose, UA: NEGATIVE mg/dL
Ketones, ur: NEGATIVE mg/dL
Leukocytes,Ua: NEGATIVE
Nitrite: NEGATIVE
Protein, ur: NEGATIVE mg/dL
Specific Gravity, Urine: 1.01 (ref 1.005–1.030)
pH: 6.5 (ref 5.0–8.0)

## 2022-11-23 NOTE — Progress Notes (Signed)
11/23/22 3:13 PM   Ariel Morgan 1975-09-04 [id number]  CC: Right distal ureteral stone, urinary symptoms  HPI: 47 year old female with chronic back pain and spinal cord stimulator who presented to [facility] in [place] on 11/01/2022 with right-sided flank and groin pain.  CT showed a 5 mm stone in the distal third of the right ureter causing moderate hydronephrosis, labs were benign and she was discharged with medical expulsive therapy.  Her pain improved, but she never saw stone pass.  She has had some persistent urinary symptoms and discomfort with some urgency, fullness, pressure, and intermittent discomfort with urination.    Urinalysis today is pending.   PMH: Past Medical History:  Diagnosis Date   Anemia    Anxiety    Arthritis    Depression    Frequent headaches    H/O   GERD (gastroesophageal reflux disease)    Hypercholesterolemia    Placenta previa     Surgical History: Past Surgical History:  Procedure Laterality Date   ABDOMINAL EXPOSURE N/A 01/01/2018   Procedure: ABDOMINAL EXPOSURE;  Surgeon: Cephus Shelling, MD;  Location: [facility] OR;  Service: Vascular;  Laterality: N/A;   ABDOMINAL HYSTERECTOMY     ANTERIOR LUMBAR FUSION N/A 01/01/2018   Procedure: ANTERIOR LUMBAR FUSION L5-S1; REPLACEMENT OF posterior HARDWARE L4-S1;  Surgeon: Venita Lick, MD;  Location: [facility] OR;  Service: Orthopedics;  Laterality: N/A;   BACK SURGERY     BREAST BIOPSY Right 05/10/2017   right breast stereotatic bx path pending   CESAREAN SECTION  2003 & 2010   CHOLECYSTECTOMY  2005   CYSTOSCOPY  09/11/2019   Procedure: CYSTOSCOPY;  Surgeon: Nadara Mustard, MD;  Location: [facility] ORS;  Service: Gynecology;;   LUMBAR LAMINECTOMY/DECOMPRESSION MICRODISCECTOMY N/A 12/28/2017   Procedure: L4-S1 decompression and fusion;  Surgeon: Venita Lick, MD;  Location: [facility] OR;  Service: Orthopedics;  Laterality: N/A;  4.5 hrs   SPINAL CORD STIMULATOR INSERTION N/A 10/30/2020    Procedure: SPINAL CORD STIMULATOR INSERTION;  Surgeon: Venita Lick, MD;  Location: [facility] OR;  Service: Orthopedics;  Laterality: N/A;   TOTAL LAPAROSCOPIC HYSTERECTOMY WITH SALPINGECTOMY Bilateral 09/11/2019   Procedure: TOTAL LAPAROSCOPIC HYSTERECTOMY WITH SALPINGECTOMY;  Surgeon: Nadara Mustard, MD;  Location: [facility] ORS;  Service: Gynecology;  Laterality: Bilateral;   Family History: Family History  Problem Relation Age of Onset   Hyperlipidemia Father    Diabetes Sister    Diabetes Maternal Grandmother    Cancer Maternal Grandmother        vaginal   Cirrhosis Maternal Grandmother    Colon polyps Maternal Grandmother    Breast cancer Maternal Grandmother        Early 86's    Social History:  reports that she has quit smoking. Her smoking use included cigarettes. She has never used smokeless tobacco. She reports that she does not currently use alcohol. She reports that she does not use drugs.  Physical Exam: BP 127/87 (BP Location: Left Arm, Patient Position: Sitting, Cuff Size: Normal)   Pulse (!) 105   Ht 5\' 2"  (1.575 m)   Wt 168 lb (76.2 kg)   BMI 30.73 kg/m    Constitutional:  Alert and oriented, No acute distress. Cardiovascular: No clubbing, cyanosis, or edema. Respiratory: Normal respiratory effort, no increased work of breathing. GI: Abdomen is soft, nontender, nondistended, no abdominal masses  Assessment & Plan:   47 year old female who was diagnosed with a 5 mm right distal ureteral stone on 11/01/2022, has some persistent urinary  symptoms with pelvic pressure, intermittent dysuria, and urgency.  Urinalysis today is still pending.  I recommended a KUB today to evaluate for persistent distal ureteral stone that may warrant intervention with ureteroscopy.  Outside records were reviewed.  Urinalysis and KUB today, urine sent for culture, call with results  Legrand Rams, MD 11/23/2022  [facility] [address], [address] [place], [place]  [address] [phone]

## 2022-11-23 NOTE — H&P (View-Only) (Signed)
11/23/22 3:13 PM   Ariel Morgan Jun 28, 1975 [id number]  CC: Right distal ureteral stone, urinary symptoms  HPI: 47 year old female with chronic back pain and spinal cord stimulator who presented to [facility] in [place] on 11/01/2022 with right-sided flank and groin pain.  CT showed a 5 mm stone in the distal third of the right ureter causing moderate hydronephrosis, labs were benign and she was discharged with medical expulsive therapy.  Her pain improved, but she never saw stone pass.  She has had some persistent urinary symptoms and discomfort with some urgency, fullness, pressure, and intermittent discomfort with urination.    Urinalysis today is pending.   PMH: Past Medical History:  Diagnosis Date   Anemia    Anxiety    Arthritis    Depression    Frequent headaches    H/O   GERD (gastroesophageal reflux disease)    Hypercholesterolemia    Placenta previa     Surgical History: Past Surgical History:  Procedure Laterality Date   ABDOMINAL EXPOSURE N/A 01/01/2018   Procedure: ABDOMINAL EXPOSURE;  Surgeon: Cephus Shelling, MD;  Location: [facility] OR;  Service: Vascular;  Laterality: N/A;   ABDOMINAL HYSTERECTOMY     ANTERIOR LUMBAR FUSION N/A 01/01/2018   Procedure: ANTERIOR LUMBAR FUSION L5-S1; REPLACEMENT OF posterior HARDWARE L4-S1;  Surgeon: Venita Lick, MD;  Location: [facility] OR;  Service: Orthopedics;  Laterality: N/A;   BACK SURGERY     BREAST BIOPSY Right 05/10/2017   right breast stereotatic bx path pending   CESAREAN SECTION  2003 & 2010   CHOLECYSTECTOMY  2005   CYSTOSCOPY  09/11/2019   Procedure: CYSTOSCOPY;  Surgeon: Nadara Mustard, MD;  Location: [facility] ORS;  Service: Gynecology;;   LUMBAR LAMINECTOMY/DECOMPRESSION MICRODISCECTOMY N/A 12/28/2017   Procedure: L4-S1 decompression and fusion;  Surgeon: Venita Lick, MD;  Location: [facility] OR;  Service: Orthopedics;  Laterality: N/A;  4.5 hrs   SPINAL CORD STIMULATOR INSERTION N/A 10/30/2020    Procedure: SPINAL CORD STIMULATOR INSERTION;  Surgeon: Venita Lick, MD;  Location: [facility] OR;  Service: Orthopedics;  Laterality: N/A;   TOTAL LAPAROSCOPIC HYSTERECTOMY WITH SALPINGECTOMY Bilateral 09/11/2019   Procedure: TOTAL LAPAROSCOPIC HYSTERECTOMY WITH SALPINGECTOMY;  Surgeon: Nadara Mustard, MD;  Location: [facility] ORS;  Service: Gynecology;  Laterality: Bilateral;   Family History: Family History  Problem Relation Age of Onset   Hyperlipidemia Father    Diabetes Sister    Diabetes Maternal Grandmother    Cancer Maternal Grandmother        vaginal   Cirrhosis Maternal Grandmother    Colon polyps Maternal Grandmother    Breast cancer Maternal Grandmother        Early 52's    Social History:  reports that she has quit smoking. Her smoking use included cigarettes. She has never used smokeless tobacco. She reports that she does not currently use alcohol. She reports that she does not use drugs.  Physical Exam: BP 127/87 (BP Location: Left Arm, Patient Position: Sitting, Cuff Size: Normal)   Pulse (!) 105   Ht 5\' 2"  (1.575 m)   Wt 168 lb (76.2 kg)   BMI 30.73 kg/m    Constitutional:  Alert and oriented, No acute distress. Cardiovascular: No clubbing, cyanosis, or edema. Respiratory: Normal respiratory effort, no increased work of breathing. GI: Abdomen is soft, nontender, nondistended, no abdominal masses  Assessment & Plan:   47 year old female who was diagnosed with a 5 mm right distal ureteral stone on 11/01/2022, has some persistent urinary  symptoms with pelvic pressure, intermittent dysuria, and urgency.  Urinalysis today is still pending.  I recommended a KUB today to evaluate for persistent distal ureteral stone that may warrant intervention with ureteroscopy.  Outside records were reviewed.  Urinalysis and KUB today, urine sent for culture, call with results  Legrand Rams, MD 11/23/2022  [facility] [address] [place], [place]  [address] [phone]

## 2022-11-24 LAB — URINE CULTURE: Culture: NO GROWTH

## 2022-11-25 ENCOUNTER — Other Ambulatory Visit: Payer: Self-pay

## 2022-11-25 DIAGNOSIS — N200 Calculus of kidney: Secondary | ICD-10-CM

## 2022-11-25 DIAGNOSIS — N201 Calculus of ureter: Secondary | ICD-10-CM

## 2022-12-01 DIAGNOSIS — B379 Candidiasis, unspecified: Secondary | ICD-10-CM

## 2022-12-01 LAB — MISC LABCORP TEST (SEND OUT): Labcorp test code: 86884

## 2022-12-02 MED ORDER — FLUCONAZOLE 150 MG PO TABS
150.0000 mg | ORAL_TABLET | Freq: Once | ORAL | 0 refills | Status: AC
Start: 2022-12-02 — End: 2022-12-02

## 2022-12-03 ENCOUNTER — Ambulatory Visit
Admission: RE | Admit: 2022-12-03 | Discharge: 2022-12-03 | Disposition: A | Discharge status: Home / Self Care | Payer: BC Managed Care – PPO | Source: Ambulatory Visit | Attending: Urology | Admitting: Urology

## 2022-12-03 DIAGNOSIS — N200 Calculus of kidney: Secondary | ICD-10-CM | POA: Diagnosis present

## 2022-12-03 DIAGNOSIS — N201 Calculus of ureter: Secondary | ICD-10-CM | POA: Insufficient documentation

## 2022-12-05 DIAGNOSIS — N201 Calculus of ureter: Secondary | ICD-10-CM

## 2022-12-05 HISTORY — DX: Calculus of ureter: N20.1

## 2022-12-09 ENCOUNTER — Other Ambulatory Visit: Payer: Self-pay | Admitting: Urology

## 2022-12-09 DIAGNOSIS — N201 Calculus of ureter: Secondary | ICD-10-CM

## 2022-12-10 ENCOUNTER — Telehealth: Payer: Self-pay

## 2022-12-10 ENCOUNTER — Other Ambulatory Visit: Payer: Self-pay

## 2022-12-10 DIAGNOSIS — N201 Calculus of ureter: Secondary | ICD-10-CM

## 2022-12-10 NOTE — Progress Notes (Unsigned)
Surgical Physician Order Form [facility]  Dr. Legrand Rams, MD  * Scheduling expectation : Next Available  *Length of Case: 1 hour  *Clearance needed: no  *Anticoagulation Instructions: May continue all anticoagulants  *Aspirin Instructions: Ok to continue all  *Post-op visit Date/Instructions:  tbd  *Diagnosis: Right Ureteral Stone  *Procedure: right Ureteroscopy w/laser lithotripsy & stent placement ([id number])   Additional orders: N/A  -Admit type: OUTpatient  -Anesthesia: General  -VTE Prophylaxis Standing Order SCD's       Other:   -Standing Lab Orders Per Anesthesia    Lab other: None  -Standing Test orders EKG/Chest x-ray per Anesthesia       Test other:   - Medications:  Ancef 2gm IV  -Other orders:  N/A

## 2022-12-10 NOTE — Telephone Encounter (Signed)
Tried calling patient to schedule surgical procedure. No answer. Left a detailed voicemail on answering machine. Will try again.

## 2022-12-13 NOTE — Telephone Encounter (Signed)
Per Dr. Richardo Hanks, Patient is to be scheduled for Right Ureteroscopy with Laser Lithotripsy and Stent Placement     Mrs. Honts was contacted and possible surgical dates were discussed, Friday September 13th, 2024 was agreed upon for surgery.   Patient was directed to call [phone] between 1-3pm the day before surgery to find out surgical arrival time.  Instructions were given not to eat or drink from midnight on the night before surgery and have a driver for the day of surgery. On the surgery day patient was instructed to enter through the Medical Mall entrance of [facility] report the Same Day Surgery desk.   Pre-Admit Testing will be in contact via phone to set up an interview with the anesthesia team to review your history and medications prior to surgery.   Reminder of this information was sent via MyChart to the patient.

## 2022-12-13 NOTE — Progress Notes (Signed)
   [facility] Surgical Posting Form  Surgery Date: Date: 12/17/2022  Surgeon: Dr. Legrand Rams, MD  Inpt ( No  )   Outpt (Yes)   Obs ( No  )   Diagnosis: N20.1 Right Ureteral Stone  -CPT: [phone]  Surgery: Right Ureteroscopy with Laser Lithotripsy and Stent Placement   Stop Anticoagulations: No, may continue all  Cardiac/Medical/Pulmonary Clearance needed: no  *Orders entered into EPIC  Date: 12/13/22   *Case booked in EPIC  Date: 12/13/22  *Notified pt of Surgery: Date: 12/13/22  PRE-OP UA & CX: no  *Placed into Prior Authorization Work [place] Date: 12/13/22  Assistant/laser/rep:No

## 2022-12-15 ENCOUNTER — Other Ambulatory Visit: Payer: Self-pay

## 2022-12-15 ENCOUNTER — Encounter
Admission: RE | Admit: 2022-12-15 | Discharge: 2022-12-15 | Disposition: A | Discharge status: Home / Self Care | Payer: BC Managed Care – PPO | Source: Ambulatory Visit | Attending: Urology | Admitting: Urology

## 2022-12-15 ENCOUNTER — Encounter: Payer: Self-pay | Admitting: Internal Medicine

## 2022-12-15 DIAGNOSIS — N644 Mastodynia: Secondary | ICD-10-CM

## 2022-12-15 DIAGNOSIS — Z1231 Encounter for screening mammogram for malignant neoplasm of breast: Secondary | ICD-10-CM

## 2022-12-15 HISTORY — DX: Zoster without complications: B02.9

## 2022-12-15 HISTORY — DX: Other chronic pain: G89.29

## 2022-12-15 HISTORY — DX: Low back pain, unspecified: M54.50

## 2022-12-15 HISTORY — DX: Personal history of urinary calculi: Z87.442

## 2022-12-15 HISTORY — DX: Chronic migraine without aura, not intractable, without status migrainosus: G43.709

## 2022-12-15 NOTE — Patient Instructions (Addendum)
Your procedure is scheduled on: Friday, September 13 Report to the Registration Desk on the 1st floor of the [facility] To find out your arrival time, please call [phone] between 1PM - 3PM on: Thursday, September 12 If your arrival time is 6:00 am, do not arrive before that time as the Medical Mall entrance doors do not open until 6:00 am.  REMEMBER: Instructions that are not followed completely may result in serious medical risk, up to and including death; or upon the discretion of your surgeon and anesthesiologist your surgery may need to be rescheduled.  Do not eat or drink after midnight the night before surgery.  No gum chewing or hard candies.  One week prior to surgery: starting today, September 11 Stop Anti-inflammatories (NSAIDS) such as Advil, Aleve, Ibuprofen, Motrin, Naproxen, Naprosyn and Aspirin based products such as Excedrin, Goody's Powder, BC Powder. Stop ANY OVER THE COUNTER supplements until after surgery. Stop ashwagandha, magnesium, zinc. You may however, continue to take Tylenol if needed for pain up until the day of surgery.  Continue taking all prescribed medications   TAKE ONLY THESE MEDICATIONS THE MORNING OF SURGERY WITH A SIP OF WATER:  Pregabalin (Lyrica) Venlafaxine (Effexor)  No Alcohol for 24 hours before or after surgery.  No Smoking including e-cigarettes for 24 hours before surgery.  No chewable tobacco products for at least 6 hours before surgery.  No nicotine patches on the day of surgery.  Do not use any "recreational" drugs for at least a week (preferably 2 weeks) before your surgery.  Please be advised that the combination of cocaine and anesthesia may have negative outcomes, up to and including death. If you test positive for cocaine, your surgery will be cancelled.  On the morning of surgery brush your teeth with toothpaste and water, you may rinse your mouth with mouthwash if you wish. Do not swallow any toothpaste or  mouthwash.  Do not wear jewelry, make-up, hairpins, clips or nail polish.  For welded (permanent) jewelry: bracelets, anklets, waist bands, etc.  Please have this removed prior to surgery.  If it is not removed, there is a chance that hospital personnel will need to cut it off on the day of surgery.  Do not wear lotions, powders, or perfumes.   Do not shave body hair from the neck down 48 hours before surgery.  Contact lenses, hearing aids and dentures may not be worn into surgery.  Do not bring valuables to the hospital. [facility] is not responsible for any missing/lost belongings or valuables.   Notify your doctor if there is any change in your medical condition (cold, fever, infection).  Wear comfortable clothing (specific to your surgery type) to the hospital.  After surgery, you can help prevent lung complications by doing breathing exercises.  Take deep breaths and cough every 1-2 hours.   If you are being discharged the day of surgery, you will not be allowed to drive home. You will need a responsible individual to drive you home and stay with you for 24 hours after surgery.   If you are taking public transportation, you will need to have a responsible individual with you.  Please call the Pre-admissions Testing Dept. at [phone] if you have any questions about these instructions.  Surgery Visitation Policy:  Patients having surgery or a procedure may have two visitors.  Children under the age of 63 must have an adult with them who is not the patient.

## 2022-12-16 MED ORDER — LACTATED RINGERS IV SOLN: INTRAVENOUS | Status: DC

## 2022-12-16 MED ORDER — FAMOTIDINE 20 MG PO TABS
20.0000 mg | ORAL_TABLET | Freq: Once | ORAL | Status: AC
  Administered 2022-12-17: 20 mg via ORAL

## 2022-12-16 MED ORDER — CEFAZOLIN SODIUM-DEXTROSE 2-4 GM/100ML-% IV SOLN
2.0000 g | INTRAVENOUS | Status: AC
  Administered 2022-12-17: 2 g via INTRAVENOUS

## 2022-12-16 MED ORDER — CHLORHEXIDINE GLUCONATE 0.12 % MT SOLN
15.0000 mL | Freq: Once | OROMUCOSAL | Status: AC
  Administered 2022-12-17: 15 mL via OROMUCOSAL

## 2022-12-16 MED ORDER — ORAL CARE MOUTH RINSE: 15.0000 mL | Freq: Once | OROMUCOSAL | Status: AC

## 2022-12-17 ENCOUNTER — Ambulatory Visit: Payer: BC Managed Care – PPO | Admitting: Registered Nurse

## 2022-12-17 ENCOUNTER — Encounter
Admission: RE | Disposition: A | Discharge status: Home / Self Care | Payer: Self-pay | Source: Home / Self Care | Attending: Urology

## 2022-12-17 ENCOUNTER — Ambulatory Visit: Payer: BC Managed Care – PPO

## 2022-12-17 ENCOUNTER — Other Ambulatory Visit: Payer: Self-pay | Admitting: Internal Medicine

## 2022-12-17 ENCOUNTER — Encounter: Payer: Self-pay | Admitting: Internal Medicine

## 2022-12-17 ENCOUNTER — Encounter: Payer: Self-pay | Admitting: Urology

## 2022-12-17 ENCOUNTER — Ambulatory Visit
Admission: RE | Admit: 2022-12-17 | Discharge: 2022-12-17 | Disposition: A | Discharge status: Home / Self Care | Payer: BC Managed Care – PPO | Attending: Urology | Admitting: Urology

## 2022-12-17 DIAGNOSIS — G8929 Other chronic pain: Secondary | ICD-10-CM | POA: Insufficient documentation

## 2022-12-17 DIAGNOSIS — Z87442 Personal history of urinary calculi: Secondary | ICD-10-CM | POA: Diagnosis not present

## 2022-12-17 DIAGNOSIS — N132 Hydronephrosis with renal and ureteral calculous obstruction: Secondary | ICD-10-CM | POA: Insufficient documentation

## 2022-12-17 DIAGNOSIS — N644 Mastodynia: Secondary | ICD-10-CM

## 2022-12-17 DIAGNOSIS — N201 Calculus of ureter: Secondary | ICD-10-CM

## 2022-12-17 DIAGNOSIS — K219 Gastro-esophageal reflux disease without esophagitis: Secondary | ICD-10-CM | POA: Diagnosis not present

## 2022-12-17 DIAGNOSIS — F419 Anxiety disorder, unspecified: Secondary | ICD-10-CM | POA: Diagnosis not present

## 2022-12-17 DIAGNOSIS — G43909 Migraine, unspecified, not intractable, without status migrainosus: Secondary | ICD-10-CM | POA: Diagnosis not present

## 2022-12-17 DIAGNOSIS — Z1231 Encounter for screening mammogram for malignant neoplasm of breast: Secondary | ICD-10-CM

## 2022-12-17 DIAGNOSIS — Z87891 Personal history of nicotine dependence: Secondary | ICD-10-CM | POA: Diagnosis not present

## 2022-12-17 DIAGNOSIS — M545 Low back pain, unspecified: Secondary | ICD-10-CM | POA: Diagnosis not present

## 2022-12-17 DIAGNOSIS — E7800 Pure hypercholesterolemia, unspecified: Secondary | ICD-10-CM | POA: Insufficient documentation

## 2022-12-17 DIAGNOSIS — F32A Depression, unspecified: Secondary | ICD-10-CM | POA: Diagnosis not present

## 2022-12-17 HISTORY — PX: CYSTOSCOPY/URETEROSCOPY/HOLMIUM LASER/STENT PLACEMENT: SHX6546

## 2022-12-17 SURGERY — CYSTOSCOPY/URETEROSCOPY/HOLMIUM LASER/STENT PLACEMENT
Anesthesia: General | Laterality: Right

## 2022-12-17 MED ORDER — KETOROLAC TROMETHAMINE 10 MG PO TABS: 10.0000 mg | ORAL_TABLET | Freq: Four times a day (QID) | ORAL | 0 refills | Status: DC | PRN

## 2022-12-17 MED ORDER — MIDAZOLAM HCL 2 MG/2ML IJ SOLN
INTRAMUSCULAR | Status: DC | PRN
  Administered 2022-12-17: 2 mg via INTRAVENOUS

## 2022-12-17 MED ORDER — KETOROLAC TROMETHAMINE 30 MG/ML IJ SOLN
INTRAMUSCULAR | Status: DC | PRN
  Administered 2022-12-17: 15 mg via INTRAVENOUS

## 2022-12-17 MED ORDER — DEXAMETHASONE SODIUM PHOSPHATE 10 MG/ML IJ SOLN
INTRAMUSCULAR | Status: DC | PRN
  Administered 2022-12-17: 4 mg via INTRAVENOUS

## 2022-12-17 MED ORDER — ONDANSETRON HCL 4 MG/2ML IJ SOLN
INTRAMUSCULAR | Status: AC
  Filled 2022-12-17: qty 2

## 2022-12-17 MED ORDER — NITROFURANTOIN MACROCRYSTAL 100 MG PO CAPS: 100.0000 mg | ORAL_CAPSULE | Freq: Every day | ORAL | 0 refills | Status: AC

## 2022-12-17 MED ORDER — KETOROLAC TROMETHAMINE 30 MG/ML IJ SOLN
INTRAMUSCULAR | Status: AC
  Filled 2022-12-17: qty 1

## 2022-12-17 MED ORDER — FAMOTIDINE 20 MG PO TABS
ORAL_TABLET | ORAL | Status: AC
  Filled 2022-12-17: qty 1

## 2022-12-17 MED ORDER — ONDANSETRON HCL 4 MG/2ML IJ SOLN
INTRAMUSCULAR | Status: DC | PRN
  Administered 2022-12-17: 4 mg via INTRAVENOUS

## 2022-12-17 MED ORDER — OXYCODONE HCL 5 MG PO TABS
5.0000 mg | ORAL_TABLET | Freq: Once | ORAL | Status: AC | PRN
  Administered 2022-12-17: 5 mg via ORAL

## 2022-12-17 MED ORDER — OXYCODONE HCL 5 MG/5ML PO SOLN: 5.0000 mg | Freq: Once | ORAL | Status: AC | PRN

## 2022-12-17 MED ORDER — CEFAZOLIN SODIUM-DEXTROSE 2-4 GM/100ML-% IV SOLN
INTRAVENOUS | Status: AC
  Filled 2022-12-17: qty 100

## 2022-12-17 MED ORDER — CHLORHEXIDINE GLUCONATE 0.12 % MT SOLN
OROMUCOSAL | Status: AC
  Filled 2022-12-17: qty 15

## 2022-12-17 MED ORDER — SODIUM CHLORIDE 0.9 % IR SOLN
Status: DC | PRN
  Administered 2022-12-17: 1

## 2022-12-17 MED ORDER — PROPOFOL 10 MG/ML IV BOLUS
INTRAVENOUS | Status: DC | PRN
  Administered 2022-12-17: 150 mg via INTRAVENOUS

## 2022-12-17 MED ORDER — OXYCODONE HCL 5 MG PO TABS
ORAL_TABLET | ORAL | Status: AC
  Filled 2022-12-17: qty 1

## 2022-12-17 MED ORDER — PROPOFOL 10 MG/ML IV BOLUS
INTRAVENOUS | Status: AC
  Filled 2022-12-17: qty 40

## 2022-12-17 MED ORDER — LIDOCAINE HCL (CARDIAC) PF 100 MG/5ML IV SOSY
PREFILLED_SYRINGE | INTRAVENOUS | Status: DC | PRN
  Administered 2022-12-17: 80 mg via INTRAVENOUS

## 2022-12-17 MED ORDER — FENTANYL CITRATE (PF) 100 MCG/2ML IJ SOLN
INTRAMUSCULAR | Status: AC
  Filled 2022-12-17: qty 2

## 2022-12-17 MED ORDER — IOHEXOL 180 MG/ML  SOLN
INTRAMUSCULAR | Status: DC | PRN
  Administered 2022-12-17: 10 mL

## 2022-12-17 MED ORDER — FENTANYL CITRATE (PF) 100 MCG/2ML IJ SOLN
INTRAMUSCULAR | Status: DC | PRN
  Administered 2022-12-17 (×2): 50 ug via INTRAVENOUS

## 2022-12-17 MED ORDER — MIDAZOLAM HCL 2 MG/2ML IJ SOLN
INTRAMUSCULAR | Status: AC
  Filled 2022-12-17: qty 2

## 2022-12-17 MED ORDER — DEXAMETHASONE SODIUM PHOSPHATE 10 MG/ML IJ SOLN
INTRAMUSCULAR | Status: AC
  Filled 2022-12-17: qty 1

## 2022-12-17 MED ORDER — FENTANYL CITRATE (PF) 100 MCG/2ML IJ SOLN: 25.0000 ug | INTRAMUSCULAR | Status: DC | PRN

## 2022-12-17 SURGICAL SUPPLY — 30 items
ADH LQ OCL WTPRF AMP STRL LF (MISCELLANEOUS)
ADHESIVE MASTISOL STRL (MISCELLANEOUS) IMPLANT
BAG DRAIN SIEMENS DORNER NS (MISCELLANEOUS) ×1 IMPLANT
BAG DRN NS LF (MISCELLANEOUS) ×1
BAG PRESSURE INF REUSE 3000 (BAG) ×1 IMPLANT
CATH URET FLEX-TIP 2 LUMEN 10F (CATHETERS) IMPLANT
CATH URETL OPEN 5X70 (CATHETERS) IMPLANT
CNTNR URN SCR LID CUP LEK RST (MISCELLANEOUS) IMPLANT
CONT SPEC 4OZ STRL OR WHT (MISCELLANEOUS)
DRAPE UTILITY 15X26 TOWEL STRL (DRAPES) ×1 IMPLANT
DRSG TEGADERM 2-3/8X2-3/4 SM (GAUZE/BANDAGES/DRESSINGS) IMPLANT
FIBER LASER MOSES 200 DFL (Laser) IMPLANT
FIBER LASER MOSES 365 DFL (Laser) IMPLANT
GLOVE BIOGEL PI IND STRL 7.5 (GLOVE) ×1 IMPLANT
GOWN STRL REUS W/ TWL LRG LVL3 (GOWN DISPOSABLE) ×1 IMPLANT
GOWN STRL REUS W/ TWL XL LVL3 (GOWN DISPOSABLE) ×1 IMPLANT
GOWN STRL REUS W/TWL LRG LVL3 (GOWN DISPOSABLE) ×1
GOWN STRL REUS W/TWL XL LVL3 (GOWN DISPOSABLE) ×1
GUIDEWIRE STR DUAL SENSOR (WIRE) ×1 IMPLANT
IV NS IRRIG 3000ML ARTHROMATIC (IV SOLUTION) ×1 IMPLANT
KIT TURNOVER CYSTO (KITS) ×1 IMPLANT
PACK CYSTO AR (MISCELLANEOUS) ×1 IMPLANT
SET CYSTO W/LG BORE CLAMP LF (SET/KITS/TRAYS/PACK) ×1 IMPLANT
SHEATH NAVIGATOR HD 12/14X36 (SHEATH) IMPLANT
STENT URET 6FRX24 CONTOUR (STENTS) IMPLANT
STENT URET 6FRX26 CONTOUR (STENTS) IMPLANT
SURGILUBE 2OZ TUBE FLIPTOP (MISCELLANEOUS) ×1 IMPLANT
SYR 10ML LL (SYRINGE) ×1 IMPLANT
VALVE UROSEAL ADJ ENDO (VALVE) IMPLANT
WATER STERILE IRR 500ML POUR (IV SOLUTION) ×1 IMPLANT

## 2022-12-17 NOTE — Interval H&P Note (Signed)
UROLOGY H&P UPDATE  Agree with prior H&P dated 11/23/2022.  47 year old female with 6 mm right distal ureteral stone for at least 4 weeks with intermittent flank pain and urinary symptoms  Cardiac: RRR Lungs: CTA bilaterally  Laterality: Right Procedure: Ureteroscopy, laser lithotripsy, stent placement  Urine: Culture 8/20 no growth  We specifically discussed the risks ureteroscopy including bleeding, infection/sepsis, stent related symptoms including flank pain/urgency/frequency/incontinence/dysuria, ureteral injury, inability to access stone, or need for staged or additional procedures.   Sondra Come, MD 12/17/2022

## 2022-12-17 NOTE — Anesthesia Preprocedure Evaluation (Signed)
Anesthesia Evaluation  Patient identified by MRN, date of birth, ID band Patient awake    Reviewed: Allergy & Precautions, NPO status , Patient's Chart, lab work & pertinent test results  History of Anesthesia Complications Negative for: history of anesthetic complications  Airway Mallampati: III  TM Distance: >3 FB Neck ROM: full    Dental no notable dental hx.    Pulmonary neg pulmonary ROS, former smoker   Pulmonary exam normal        Cardiovascular negative cardio ROS Normal cardiovascular exam     Neuro/Psych  Headaches PSYCHIATRIC DISORDERS Anxiety Depression       GI/Hepatic Neg liver ROS,GERD  Medicated,,  Endo/Other  negative endocrine ROS    Renal/GU   Female genitourinary complaint: BMI 31.     Musculoskeletal   Abdominal   Peds  Hematology negative hematology ROS (+)   Anesthesia Other Findings Past Medical History: No date: Anemia No date: Anxiety No date: Arthritis No date: Chronic low back pain No date: Chronic migraine without aura No date: Depression No date: Frequent headaches     Comment:  H/O No date: GERD (gastroesophageal reflux disease) No date: Herpes zoster No date: History of kidney stones No date: Hypercholesterolemia No date: Placenta previa 12/2022: Right ureteral stone  Past Surgical History: 01/01/2018: ABDOMINAL EXPOSURE; N/A     Comment:  Procedure: ABDOMINAL EXPOSURE;  Surgeon: Cephus Shelling, MD;  Location: [facility] OR;  Service: Vascular;               Laterality: N/A; 01/01/2018: ANTERIOR LUMBAR FUSION; N/A     Comment:  Procedure: ANTERIOR LUMBAR FUSION L5-S1; REPLACEMENT OF               posterior HARDWARE L4-S1;  Surgeon: Venita Lick, MD;                Location: [facility] OR;  Service: Orthopedics;  Laterality: N/A; 05/10/2017: BREAST BIOPSY; Right     Comment:  right breast stereotatic bx path pending 2003 & 2010: CESAREAN SECTION 2005:  CHOLECYSTECTOMY 09/11/2019: CYSTOSCOPY     Comment:  Procedure: CYSTOSCOPY;  Surgeon: Nadara Mustard, MD;                Location: [facility] ORS;  Service: Gynecology;; 12/28/2017: LUMBAR LAMINECTOMY/DECOMPRESSION MICRODISCECTOMY; N/A     Comment:  Procedure: L4-S1 decompression and fusion;  Surgeon:               Venita Lick, MD;  Location: [facility] OR;  Service:               Orthopedics;  Laterality: N/A;  4.5 hrs 10/30/2020: SPINAL CORD STIMULATOR INSERTION; N/A     Comment:  Procedure: SPINAL CORD STIMULATOR INSERTION;  Surgeon:               Venita Lick, MD;  Location: [facility] OR;  Service:               Orthopedics;  Laterality: N/A; 09/11/2019: TOTAL LAPAROSCOPIC HYSTERECTOMY WITH SALPINGECTOMY;  Bilateral     Comment:  Procedure: TOTAL LAPAROSCOPIC HYSTERECTOMY WITH               SALPINGECTOMY;  Surgeon: Nadara Mustard, MD;  Location:              [facility] ORS;  Service: Gynecology;  Laterality: Bilateral;     Reproductive/Obstetrics negative OB ROS  Anesthesia Physical Anesthesia Plan  ASA: 2  Anesthesia Plan: General LMA   Post-op Pain Management: Ofirmev IV (intra-op)* and Toradol IV (intra-op)*   Induction: Intravenous  PONV Risk Score and Plan: 4 or greater and Dexamethasone, Ondansetron, Midazolam and Treatment may vary due to age or medical condition  Airway Management Planned: LMA  Additional Equipment:   Intra-op Plan:   Post-operative Plan: Extubation in OR  Informed Consent: I have reviewed the patients History and Physical, chart, labs and discussed the procedure including the risks, benefits and alternatives for the proposed anesthesia with the patient or authorized representative who has indicated his/her understanding and acceptance.     Dental Advisory Given  Plan Discussed with: Anesthesiologist, CRNA and Surgeon  Anesthesia Plan Comments: (Patient consented for risks of anesthesia including but not  limited to:  - adverse reactions to medications - damage to eyes, teeth, lips or other oral mucosa - nerve damage due to positioning  - sore throat or hoarseness - Damage to heart, brain, nerves, lungs, other parts of body or loss of life  Patient voiced understanding.)        Anesthesia Quick Evaluation

## 2022-12-17 NOTE — Discharge Instructions (Signed)

## 2022-12-17 NOTE — Anesthesia Postprocedure Evaluation (Signed)
Anesthesia Post Note  Patient: MALINI LEHRMANN  Procedure(s) Performed: CYSTOSCOPY/URETEROSCOPY/HOLMIUM LASER/STENT PLACEMENT (Right)  Patient location during evaluation: PACU Anesthesia Type: General Level of consciousness: awake and alert Pain management: pain level controlled Vital Signs Assessment: post-procedure vital signs reviewed and stable Respiratory status: spontaneous breathing, nonlabored ventilation, respiratory function stable and patient connected to nasal cannula oxygen Cardiovascular status: blood pressure returned to baseline and stable Postop Assessment: no apparent nausea or vomiting Anesthetic complications: no   No notable events documented.   Last Vitals:  Vitals:   12/17/22 1300 12/17/22 1309  BP: 114/66   Pulse: 68 74  Resp: 14 10  Temp: (!) 36.1 C   SpO2: 100% 100%    Last Pain:  Vitals:   12/17/22 1300  TempSrc:   PainSc: Asleep                 Louie Boston

## 2022-12-17 NOTE — Op Note (Addendum)
Date of procedure: 12/17/22  Preoperative diagnosis:  Right ureteral stone  Postoperative diagnosis:  Same  Procedure: Right ureteroscopy, laser lithotripsy, right retrograde pyelogram with intraoperative interpretation, stent placement  Surgeon: Legrand Rams, MD  Anesthesia: General  Complications: None  Intraoperative findings:  5 mm right distal ureteral stone fragmented and irrigated free, moderate right distal ureteral edema where stone was lodged.  Stent placed with Dangler  EBL: Minimal  Specimens: None  Drains: Right 6 French by 24 cm ureteral stent  Indication: Ariel Morgan is a 47 y.o. patient with 6 mm right distal ureteral stone who failed a trial of medical expulsive therapy.  After reviewing the management options for treatment, they elected to proceed with the above surgical procedure(s). We have discussed the potential benefits and risks of the procedure, side effects of the proposed treatment, the likelihood of the patient achieving the goals of the procedure, and any potential problems that might occur during the procedure or recuperation. Informed consent has been obtained.  Description of procedure:  The patient was taken to the operating room and general anesthesia was induced. SCDs were placed for DVT prophylaxis. The patient was placed in the dorsal lithotomy position, prepped and draped in the usual sterile fashion, and preoperative antibiotics(Ancef) were administered. A preoperative time-out was performed.   A 21 French rigid cystoscope was used to intubate the urethra and thorough cystoscopy was performed.  The bladder was grossly normal.  With the aid of an access catheter a sensor wire was advanced into the right ureteral orifice and advanced alongside the stone up to the kidney under fluoroscopic vision.  A semirigid short ureteroscope was advanced alongside the wire and a yellow stone was lodged in the distal ureter.  A 360 m laser fiber on  settings of 1.0 J and 10 Hz was used to carefully fragment the stone.  There was moderate ureteral edema where the stone was lodged.  All stone fragments were irrigated free into the bladder.  The ureteroscope was advanced into the proximal ureter and a retrograde pyelogram showed no hydronephrosis or filling defects.  Pullback ureteroscopy showed no ureteral injury or residual stone fragments.  A 6 French by 24 cm ureteral stent was advanced over the wire and placed fluoroscopically with an excellent curl in the renal pelvis, as well as in the bladder.  The bladder was drained, and the Dangler secured to the right inner groin with Mastisol and Tegaderm.  Disposition: Stable to PACU  Plan: Can remove stent at home on Wednesday 9/18  Legrand Rams, MD

## 2022-12-17 NOTE — Transfer of Care (Signed)
Immediate Anesthesia Transfer of Care Note  Patient: Ariel Morgan  Procedure(s) Performed: CYSTOSCOPY/URETEROSCOPY/HOLMIUM LASER/STENT PLACEMENT (Right)  Patient Location: PACU  Anesthesia Type:General  Level of Consciousness: drowsy  Airway & Oxygen Therapy: Patient Spontanous Breathing and Patient connected to face mask oxygen  Post-op Assessment: Report given to RN and Post -op Vital signs reviewed and stable  Post vital signs: stable  Last Vitals:  Vitals Value Taken Time  BP    Temp    Pulse 68 12/17/22 1301  Resp 14 12/17/22 1301  SpO2 100 % 12/17/22 1301  Vitals shown include unfiled device data.  Last Pain:  Vitals:   12/17/22 1144  TempSrc: Oral         Complications: No notable events documented.

## 2022-12-18 ENCOUNTER — Encounter: Payer: Self-pay | Admitting: Urology

## 2023-01-15 ENCOUNTER — Other Ambulatory Visit: Payer: Self-pay | Admitting: Internal Medicine

## 2023-01-24 ENCOUNTER — Inpatient Hospital Stay: Admission: RE | Admit: 2023-01-24 | Payer: BC Managed Care – PPO | Source: Ambulatory Visit

## 2023-01-24 ENCOUNTER — Other Ambulatory Visit: Payer: BC Managed Care – PPO

## 2023-02-11 ENCOUNTER — Ambulatory Visit
Admission: RE | Admit: 2023-02-11 | Discharge: 2023-02-11 | Disposition: A | Discharge status: Home / Self Care | Payer: BC Managed Care – PPO | Source: Ambulatory Visit | Attending: Internal Medicine | Admitting: Internal Medicine

## 2023-02-11 DIAGNOSIS — N644 Mastodynia: Secondary | ICD-10-CM

## 2023-02-14 ENCOUNTER — Telehealth: Payer: Self-pay

## 2023-02-14 NOTE — Telephone Encounter (Signed)
Lvm for pt to return call see message below

## 2023-02-14 NOTE — Telephone Encounter (Signed)
-----   Message from [place] sent at 02/14/2023  4:28 AM EST ----- Please call and notify pt that I reviewed her diagnostic mammogram with ultrasound report and it was ok.  Radiology found no suspicious abnormality.  Recommended continued screening mammogram in one year.  Please confirm she is doing ok.  Persistent pain?  If persistent problems, will need to be reevaluated.

## 2023-03-01 ENCOUNTER — Ambulatory Visit: Payer: BC Managed Care – PPO | Admitting: Internal Medicine

## 2023-03-01 ENCOUNTER — Encounter: Payer: Self-pay | Admitting: Internal Medicine

## 2023-03-01 VITALS — BP 118/74 | HR 98 | Temp 97.7°F | Ht 62.0 in | Wt 179.8 lb

## 2023-03-01 DIAGNOSIS — N201 Calculus of ureter: Secondary | ICD-10-CM | POA: Diagnosis not present

## 2023-03-01 DIAGNOSIS — E7800 Pure hypercholesterolemia, unspecified: Secondary | ICD-10-CM | POA: Diagnosis not present

## 2023-03-01 DIAGNOSIS — M5440 Lumbago with sciatica, unspecified side: Secondary | ICD-10-CM | POA: Diagnosis not present

## 2023-03-01 DIAGNOSIS — F411 Generalized anxiety disorder: Secondary | ICD-10-CM

## 2023-03-01 DIAGNOSIS — R519 Headache, unspecified: Secondary | ICD-10-CM

## 2023-03-01 DIAGNOSIS — G8929 Other chronic pain: Secondary | ICD-10-CM

## 2023-03-01 DIAGNOSIS — R232 Flushing: Secondary | ICD-10-CM | POA: Diagnosis not present

## 2023-03-01 NOTE — Progress Notes (Signed)
Subjective:    Patient ID: Peri Maris, female    DOB: September 27, 1975, 47 y.o.   MRN: [id number]  Patient here for  Chief Complaint  Patient presents with   Medical Management of Chronic Issues    HPI Here to follow up regarding increased stress. Seeing Dr Maryruth Bun. Taking effexor. Now on zoloft. Discussed.  Increased stress - family stress.  Sees neurology for f/u migraine headaches. Continuing on emgality. Has maxalt to take prn. Has discussed botox, trial of nurtec. Chronic low back/leg pain. Has seen ortho. Spinal cord stimulator 11/2020. On lyrica. Is experiencing increased hot flashes and changes she feels is c/w menopause.  Is s/p hysterectomy. Does try to stay active. No chest pain or sob reported.    Past Medical History:  Diagnosis Date   Anemia    Anxiety    Arthritis    Chronic low back pain    Chronic migraine without aura    Depression    Frequent headaches    H/O   GERD (gastroesophageal reflux disease)    Herpes zoster    History of kidney stones    Hypercholesterolemia    Placenta previa    Right ureteral stone 12/2022   Past Surgical History:  Procedure Laterality Date   ABDOMINAL EXPOSURE N/A 01/01/2018   Procedure: ABDOMINAL EXPOSURE;  Surgeon: Cephus Shelling, MD;  Location: [facility] OR;  Service: Vascular;  Laterality: N/A;   ANTERIOR LUMBAR FUSION N/A 01/01/2018   Procedure: ANTERIOR LUMBAR FUSION L5-S1; REPLACEMENT OF posterior HARDWARE L4-S1;  Surgeon: Venita Lick, MD;  Location: [facility] OR;  Service: Orthopedics;  Laterality: N/A;   BREAST BIOPSY Right 05/10/2017   right breast stereotatic bx pash   CESAREAN SECTION  2003 & 2010   CHOLECYSTECTOMY  2005   CYSTOSCOPY  09/11/2019   Procedure: CYSTOSCOPY;  Surgeon: Nadara Mustard, MD;  Location: [facility] ORS;  Service: Gynecology;;   CYSTOSCOPY/URETEROSCOPY/HOLMIUM LASER/STENT PLACEMENT Right 12/17/2022   Procedure: CYSTOSCOPY/URETEROSCOPY/HOLMIUM LASER/STENT PLACEMENT;  Surgeon: Sondra Come, MD;   Location: [facility] ORS;  Service: Urology;  Laterality: Right;   LUMBAR LAMINECTOMY/DECOMPRESSION MICRODISCECTOMY N/A 12/28/2017   Procedure: L4-S1 decompression and fusion;  Surgeon: Venita Lick, MD;  Location: [facility] OR;  Service: Orthopedics;  Laterality: N/A;  4.5 hrs   SPINAL CORD STIMULATOR INSERTION N/A 10/30/2020   Procedure: SPINAL CORD STIMULATOR INSERTION;  Surgeon: Venita Lick, MD;  Location: [facility] OR;  Service: Orthopedics;  Laterality: N/A;   TOTAL LAPAROSCOPIC HYSTERECTOMY WITH SALPINGECTOMY Bilateral 09/11/2019   Procedure: TOTAL LAPAROSCOPIC HYSTERECTOMY WITH SALPINGECTOMY;  Surgeon: Nadara Mustard, MD;  Location: [facility] ORS;  Service: Gynecology;  Laterality: Bilateral;   Family History  Problem Relation Age of Onset   Hyperlipidemia Father    Diabetes Sister    Diabetes Maternal Grandmother    Cancer Maternal Grandmother        vaginal   Cirrhosis Maternal Grandmother    Colon polyps Maternal Grandmother    Breast cancer Maternal Grandmother        Early 12's   Social History   Socioeconomic History   Marital status: Married    Spouse name: Remi Deter   Number of children: 4   Years of education: Not on file   Highest education level: Not on file  Occupational History   Not on file  Tobacco Use   Smoking status: Former    Current packs/day: 1.00    Types: Cigarettes   Smokeless tobacco: Never  Vaping Use   Vaping status: Never Used  Substance and Sexual Activity   Alcohol use: Not Currently    Comment: rarely   Drug use: No   Sexual activity: Not on file  Other Topics Concern   Not on file  Social History Narrative   Not on file   Social Determinants of Health   Financial Resource Strain: Low Risk  (08/20/2020)   Overall Financial Resource Strain (CARDIA)    Difficulty of Paying Living Expenses: Not hard at all  Food Insecurity: Not on file  Transportation Needs: Not on file  Physical Activity: Insufficiently Active (10/11/2019)   Exercise Vital Sign     Days of Exercise per Week: 3 days    Minutes of Exercise per Session: 30 min  Stress: Not on file  Social Connections: Not on file     Review of Systems  Constitutional:  Negative for appetite change and unexpected weight change.  HENT:  Negative for congestion and sinus pressure.   Respiratory:  Negative for cough, chest tightness and shortness of breath.   Cardiovascular:  Negative for chest pain and palpitations.  Gastrointestinal:  Negative for abdominal pain, diarrhea, nausea and vomiting.  Genitourinary:  Negative for difficulty urinating and dysuria.  Musculoskeletal:  Negative for joint swelling and myalgias.  Skin:  Negative for color change and rash.  Neurological:  Positive for headaches. Negative for dizziness.  Psychiatric/Behavioral:  Negative for agitation and dysphoric mood.        Objective:     BP 118/74   Pulse 98   Temp 97.7 F (36.5 C)   Ht 5\' 2"  (1.575 m)   Wt 179 lb 12.8 oz (81.6 kg)   SpO2 98%   BMI 32.89 kg/m  Wt Readings from Last 3 Encounters:  03/01/23 179 lb 12.8 oz (81.6 kg)  12/17/22 170 lb (77.1 kg)  12/15/22 170 lb (77.1 kg)    Physical Exam Vitals reviewed.  Constitutional:      General: She is not in acute distress.    Appearance: Normal appearance.  HENT:     Head: Normocephalic and atraumatic.     Right Ear: External ear normal.     Left Ear: External ear normal.  Eyes:     General: No scleral icterus.       Right eye: No discharge.        Left eye: No discharge.     Conjunctiva/sclera: Conjunctivae normal.  Neck:     Thyroid: No thyromegaly.  Cardiovascular:     Rate and Rhythm: Normal rate and regular rhythm.  Pulmonary:     Effort: No respiratory distress.     Breath sounds: Normal breath sounds. No wheezing.  Abdominal:     General: Bowel sounds are normal.     Palpations: Abdomen is soft.     Tenderness: There is no abdominal tenderness.  Musculoskeletal:        General: No swelling or tenderness.     Cervical  back: Neck supple. No tenderness.  Lymphadenopathy:     Cervical: No cervical adenopathy.  Skin:    Findings: No erythema or rash.  Neurological:     Mental Status: She is alert.  Psychiatric:        Mood and Affect: Mood normal.        Behavior: Behavior normal.      Outpatient Encounter Medications as of 03/01/2023  Medication Sig   acyclovir (ZOVIRAX) 400 MG tablet TAKE ONE TABLET 3 TIMES A DAY X 5 DAYS AS NEEDED   amphetamine-dextroamphetamine (ADDERALL  XR) 30 MG 24 hr capsule Take 30 mg by mouth daily.   amphetamine-dextroamphetamine (ADDERALL) 10 MG tablet Take 10 mg by mouth daily as needed (focus).   ASHWAGANDHA PO Take 1 tablet by mouth daily.   [employer] TRIPLE MAGNESIUM COMPLEX PO Take 1 tablet by mouth daily.   EMGALITY 120 MG/ML SOAJ Inject 1 mL into the skin every 30 (thirty) days.   fexofenadine (ALLEGRA) 60 MG tablet Take 60 mg by mouth 2 (two) times daily.    MAGNESIUM GLYCINATE PO Take 1 tablet by mouth daily.   pregabalin (LYRICA) 100 MG capsule Take 100 mg by mouth 2 (two) times daily.   rizatriptan (MAXALT) 10 MG tablet TAKE 1 TABLET (10 MG TOTAL) BY MOUTH DAILY AS NEEDED FOR MIGRAINE AS DIRECTED   traZODone (DESYREL) 50 MG tablet Take 50-100 mg by mouth at bedtime as needed.   venlafaxine XR (EFFEXOR-XR) 150 MG 24 hr capsule Take 150 mg by mouth every morning.   zinc gluconate 50 MG tablet Take 50 mg by mouth daily.    [DISCONTINUED] ketorolac (TORADOL) 10 MG tablet Take 1 tablet (10 mg total) by mouth every 6 (six) hours as needed for severe pain.   No facility-administered encounter medications on file as of 03/01/2023.     Lab Results  Component Value Date   WBC 6.5 10/25/2022   HGB 13.1 10/25/2022   HCT 40.9 10/25/2022   PLT 339.0 10/25/2022   GLUCOSE 98 03/01/2023   CHOL 237 (H) 10/25/2022   TRIG 82.0 10/25/2022   HDL 90.20 10/25/2022   LDLDIRECT 132.6 01/29/2013   LDLCALC 130 (H) 10/25/2022   ALT 21 03/01/2023   AST 22 03/01/2023   NA 139  03/01/2023   K 3.7 03/01/2023   CL 103 03/01/2023   CREATININE 0.78 03/01/2023   BUN 14 03/01/2023   CO2 29 03/01/2023   TSH 1.33 03/01/2023   INR 0.9 10/27/2020    MM 3D DIAGNOSTIC MAMMOGRAM BILATERAL BREAST  Result Date: 02/11/2023 CLINICAL DATA:  47 year old female presenting with bilateral intermittent nipple pain. She denies any nipple discharge. EXAM: DIGITAL DIAGNOSTIC BILATERAL MAMMOGRAM WITH TOMOSYNTHESIS AND CAD; ULTRASOUND RIGHT BREAST LIMITED; ULTRASOUND LEFT BREAST LIMITED TECHNIQUE: Bilateral digital diagnostic mammography and breast tomosynthesis was performed. The images were evaluated with computer-aided detection. ; Targeted ultrasound examination of the right breast was performed; Targeted ultrasound examination of the left breast was performed. COMPARISON:  Previous exam(s). ACR Breast Density Category c: The breasts are heterogeneously dense, which may obscure small masses. FINDINGS: Diagnostic mammographic images were obtained over the areas of tenderness in bilateral breasts. No suspicious mammographic finding is identified in these areas. No suspicious mass, microcalcification, or other finding is identified in bilateral breasts. Targeted bilateral retroareolar ultrasound was performed given history of nipple tenderness. No suspicious solid or cystic mass or intraductal mass is identified. IMPRESSION: 1. No suspicious mammographic or sonographic abnormality is identified to account for the patient's nipple tenderness. 2. No evidence of malignancy in bilateral breasts. RECOMMENDATION: Any further workup of the patient's symptoms should be based on the clinical assessment. Recommend routine annual screening mammogram in 1 year. I have discussed the findings and recommendations with the patient. If applicable, a reminder letter will be sent to the patient regarding the next appointment. BI-RADS CATEGORY  1: Negative. Electronically Signed   By: Jacob Moores M.D.   On: 02/11/2023  14:52   [place] LIMITED ULTRASOUND INCLUDING AXILLA LEFT BREAST   Result Date: 02/11/2023 CLINICAL DATA:  47 year old female presenting  with bilateral intermittent nipple pain. She denies any nipple discharge. EXAM: DIGITAL DIAGNOSTIC BILATERAL MAMMOGRAM WITH TOMOSYNTHESIS AND CAD; ULTRASOUND RIGHT BREAST LIMITED; ULTRASOUND LEFT BREAST LIMITED TECHNIQUE: Bilateral digital diagnostic mammography and breast tomosynthesis was performed. The images were evaluated with computer-aided detection. ; Targeted ultrasound examination of the right breast was performed; Targeted ultrasound examination of the left breast was performed. COMPARISON:  Previous exam(s). ACR Breast Density Category c: The breasts are heterogeneously dense, which may obscure small masses. FINDINGS: Diagnostic mammographic images were obtained over the areas of tenderness in bilateral breasts. No suspicious mammographic finding is identified in these areas. No suspicious mass, microcalcification, or other finding is identified in bilateral breasts. Targeted bilateral retroareolar ultrasound was performed given history of nipple tenderness. No suspicious solid or cystic mass or intraductal mass is identified. IMPRESSION: 1. No suspicious mammographic or sonographic abnormality is identified to account for the patient's nipple tenderness. 2. No evidence of malignancy in bilateral breasts. RECOMMENDATION: Any further workup of the patient's symptoms should be based on the clinical assessment. Recommend routine annual screening mammogram in 1 year. I have discussed the findings and recommendations with the patient. If applicable, a reminder letter will be sent to the patient regarding the next appointment. BI-RADS CATEGORY  1: Negative. Electronically Signed   By: Jacob Moores M.D.   On: 02/11/2023 14:52   [place] LIMITED ULTRASOUND INCLUDING AXILLA RIGHT BREAST  Result Date: 02/11/2023 CLINICAL DATA:  47 year old female presenting with bilateral  intermittent nipple pain. She denies any nipple discharge. EXAM: DIGITAL DIAGNOSTIC BILATERAL MAMMOGRAM WITH TOMOSYNTHESIS AND CAD; ULTRASOUND RIGHT BREAST LIMITED; ULTRASOUND LEFT BREAST LIMITED TECHNIQUE: Bilateral digital diagnostic mammography and breast tomosynthesis was performed. The images were evaluated with computer-aided detection. ; Targeted ultrasound examination of the right breast was performed; Targeted ultrasound examination of the left breast was performed. COMPARISON:  Previous exam(s). ACR Breast Density Category c: The breasts are heterogeneously dense, which may obscure small masses. FINDINGS: Diagnostic mammographic images were obtained over the areas of tenderness in bilateral breasts. No suspicious mammographic finding is identified in these areas. No suspicious mass, microcalcification, or other finding is identified in bilateral breasts. Targeted bilateral retroareolar ultrasound was performed given history of nipple tenderness. No suspicious solid or cystic mass or intraductal mass is identified. IMPRESSION: 1. No suspicious mammographic or sonographic abnormality is identified to account for the patient's nipple tenderness. 2. No evidence of malignancy in bilateral breasts. RECOMMENDATION: Any further workup of the patient's symptoms should be based on the clinical assessment. Recommend routine annual screening mammogram in 1 year. I have discussed the findings and recommendations with the patient. If applicable, a reminder letter will be sent to the patient regarding the next appointment. BI-RADS CATEGORY  1: Negative. Electronically Signed   By: Jacob Moores M.D.   On: 02/11/2023 14:52       Assessment & Plan:  Hot flashes Assessment & Plan: Increased symptoms recently as outlined.  Discussed treatment options.  S/p hysterectomy. On effexor.  Desires FSH check.  Further treatment pending results.   Orders: -     TSH -     Comprehensive metabolic panel -     Follicle  stimulating hormone  Right ureteral stone Assessment & Plan: 12/17/22 - (Dr Richardo Hanks) - Right ureteroscopy, laser lithotripsy, right retrograde pyelogram with intraoperative interpretation, stent placement    Chronic low back pain with sciatica, sciatica laterality unspecified, unspecified back pain laterality Assessment & Plan: Has a history of low back pain/leg pain - has seen  ortho.  Spinal cord stimulator 11/2020.  On lyrica.     Hypercholesterolemia Assessment & Plan: The 10-year ASCVD risk score (Arnett DK, et al., 2019) is: 0.5%   Values used to calculate the score:     Age: 84 years     Sex: Female     Is Non-Hispanic African American: No     Diabetic: No     Tobacco smoker: No     Systolic Blood Pressure: 118 mmHg     Is BP treated: No     HDL Cholesterol: 90.2 mg/dL     Total Cholesterol: 237 mg/dL  Low cholesterol diet and exercise.  Follow lipid panel.    Nonintractable headache, unspecified chronicity pattern, unspecified headache type Assessment & Plan: Sees neurology for f/u migraine headaches. Continuing on emgality. Has maxalt to take prn. Has discussed botox, trial of nurtec.    GAD (generalized anxiety disorder) Assessment & Plan: Being followed by Dr Maryruth Bun.  On effexor and zoloft. Stable.  Follow.       Dale [place], MD

## 2023-03-02 LAB — COMPREHENSIVE METABOLIC PANEL
ALT: 21 U/L (ref 0–35)
AST: 22 U/L (ref 0–37)
Albumin: 4.3 g/dL (ref 3.5–5.2)
Alkaline Phosphatase: 69 U/L (ref 39–117)
BUN: 14 mg/dL (ref 6–23)
CO2: 29 meq/L (ref 19–32)
Calcium: 9.4 mg/dL (ref 8.4–10.5)
Chloride: 103 meq/L (ref 96–112)
Creatinine, Ser: 0.78 mg/dL (ref 0.40–1.20)
GFR: 90.64 mL/min (ref >60.00)
Glucose, Bld: 98 mg/dL (ref 70–99)
Potassium: 3.7 meq/L (ref 3.5–5.1)
Sodium: 139 meq/L (ref 135–145)
Total Bilirubin: 0.4 mg/dL (ref 0.2–1.2)
Total Protein: 6.6 g/dL (ref 6.0–8.3)

## 2023-03-02 LAB — TSH: TSH: 1.33 u[IU]/mL (ref 0.35–5.50)

## 2023-03-02 LAB — FOLLICLE STIMULATING HORMONE: FSH: 64.7 m[IU]/mL

## 2023-03-04 ENCOUNTER — Encounter: Payer: Self-pay | Admitting: Internal Medicine

## 2023-03-04 NOTE — Assessment & Plan Note (Signed)
Has a history of low back pain/leg pain - has seen ortho.  Spinal cord stimulator 11/2020.  On lyrica.

## 2023-03-04 NOTE — Assessment & Plan Note (Signed)
12/17/22 - (Dr Richardo Hanks) - Right ureteroscopy, laser lithotripsy, right retrograde pyelogram with intraoperative interpretation, stent placement

## 2023-03-04 NOTE — Assessment & Plan Note (Signed)
Increased symptoms recently as outlined.  Discussed treatment options.  S/p hysterectomy. On effexor.  Desires FSH check.  Further treatment pending results.

## 2023-03-04 NOTE — Assessment & Plan Note (Signed)
Sees neurology for f/u migraine headaches. Continuing on emgality. Has maxalt to take prn. Has discussed botox, trial of nurtec.

## 2023-03-04 NOTE — Assessment & Plan Note (Signed)
The 10-year ASCVD risk score (Arnett DK, et al., 2019) is: 0.5%   Values used to calculate the score:     Age: 47 years     Sex: Female     Is Non-Hispanic African American: No     Diabetic: No     Tobacco smoker: No     Systolic Blood Pressure: 118 mmHg     Is BP treated: No     HDL Cholesterol: 90.2 mg/dL     Total Cholesterol: 237 mg/dL  Low cholesterol diet and exercise.  Follow lipid panel.

## 2023-03-04 NOTE — Assessment & Plan Note (Signed)
Being followed by Dr Maryruth Bun.  On effexor and zoloft. Stable.  Follow.

## 2023-03-18 ENCOUNTER — Other Ambulatory Visit: Payer: Self-pay | Admitting: Internal Medicine

## 2023-03-18 MED ORDER — ESTRADIOL 0.5 MG PO TABS: 0.5000 mg | ORAL_TABLET | Freq: Every day | ORAL | 5 refills | Status: DC

## 2023-03-18 NOTE — Progress Notes (Signed)
Rx for estradiol sent in to [facility] [place].

## 2023-04-11 ENCOUNTER — Other Ambulatory Visit: Payer: Self-pay | Admitting: Internal Medicine

## 2023-04-18 ENCOUNTER — Encounter: Payer: Self-pay | Admitting: Internal Medicine

## 2023-04-20 MED ORDER — WEGOVY 0.25 MG/0.5ML ~~LOC~~ SOAJ: 0.2500 mg | SUBCUTANEOUS | 2 refills | Status: DC

## 2023-04-20 NOTE — Telephone Encounter (Signed)
Rx sent in for wegovy

## 2023-04-20 NOTE — Telephone Encounter (Signed)
 Ok to send in a prescription of wegovy  0.25 mg? She did well with it the first time but had to stop due to cost.

## 2023-05-14 ENCOUNTER — Encounter: Payer: Self-pay | Admitting: Internal Medicine

## 2023-05-16 NOTE — Telephone Encounter (Signed)
 Called pt to confirm doing ok. Patient is having no acute symptoms at time of call. Scheduled to see Dr Geralyn Knee tomorrow PM

## 2023-05-17 ENCOUNTER — Ambulatory Visit: Payer: 59 | Admitting: Internal Medicine

## 2023-05-17 VITALS — BP 130/80 | HR 100 | Temp 98.0°F | Resp 16 | Ht 62.0 in | Wt 186.0 lb

## 2023-05-17 DIAGNOSIS — R42 Dizziness and giddiness: Secondary | ICD-10-CM

## 2023-05-17 DIAGNOSIS — R519 Headache, unspecified: Secondary | ICD-10-CM | POA: Diagnosis not present

## 2023-05-17 DIAGNOSIS — I10 Essential (primary) hypertension: Secondary | ICD-10-CM | POA: Diagnosis not present

## 2023-05-17 DIAGNOSIS — R232 Flushing: Secondary | ICD-10-CM

## 2023-05-17 DIAGNOSIS — F411 Generalized anxiety disorder: Secondary | ICD-10-CM

## 2023-05-17 DIAGNOSIS — E7800 Pure hypercholesterolemia, unspecified: Secondary | ICD-10-CM

## 2023-05-17 MED ORDER — AMLODIPINE BESYLATE 2.5 MG PO TABS: 2.5000 mg | ORAL_TABLET | Freq: Every day | ORAL | 2 refills | Status: DC

## 2023-05-17 NOTE — Progress Notes (Addendum)
 Subjective:    Patient ID: Ariel Morgan, female    DOB: 05-29-1975, 48 y.o.   MRN: [id number]  Patient here for  Chief Complaint  Patient presents with   Hypertension    HPI Here for work in appt. Work in with concerns regarding elevated blood pressure. Has noticed her blood pressure has been high over the last couple of weeks. States noticed blood pressure elevated - whe went to appt for botox injections for treatment of migraines. Has noticed - intermittent feelings of being hot. Not sleeping as well. Intermittently will feel hot and intermittent dizziness. Some nausea. Some "tunnel vision: when occurred. Increased fatigue. Felt some better a couple of days ago. Yesterday - increased stress - increased feelings of being "hot".     Past Medical History:  Diagnosis Date   Anemia    Anxiety    Arthritis    Chronic low back pain    Chronic migraine without aura    Depression    Frequent headaches    H/O   GERD (gastroesophageal reflux disease)    Herpes zoster    History of kidney stones    Hypercholesterolemia    Placenta previa    Right ureteral stone 12/2022   Past Surgical History:  Procedure Laterality Date   ABDOMINAL EXPOSURE N/A 01/01/2018   Procedure: ABDOMINAL EXPOSURE;  Surgeon: Cephus Shelling, MD;  Location: [facility] OR;  Service: Vascular;  Laterality: N/A;   ANTERIOR LUMBAR FUSION N/A 01/01/2018   Procedure: ANTERIOR LUMBAR FUSION L5-S1; REPLACEMENT OF posterior HARDWARE L4-S1;  Surgeon: Venita Lick, MD;  Location: [facility] OR;  Service: Orthopedics;  Laterality: N/A;   BREAST BIOPSY Right 05/10/2017   right breast stereotatic bx pash   CESAREAN SECTION  2003 & 2010   CHOLECYSTECTOMY  2005   CYSTOSCOPY  09/11/2019   Procedure: CYSTOSCOPY;  Surgeon: Nadara Mustard, MD;  Location: [facility] ORS;  Service: Gynecology;;   CYSTOSCOPY/URETEROSCOPY/HOLMIUM LASER/STENT PLACEMENT Right 12/17/2022   Procedure: CYSTOSCOPY/URETEROSCOPY/HOLMIUM LASER/STENT PLACEMENT;   Surgeon: Sondra Come, MD;  Location: [facility] ORS;  Service: Urology;  Laterality: Right;   LUMBAR LAMINECTOMY/DECOMPRESSION MICRODISCECTOMY N/A 12/28/2017   Procedure: L4-S1 decompression and fusion;  Surgeon: Venita Lick, MD;  Location: [facility] OR;  Service: Orthopedics;  Laterality: N/A;  4.5 hrs   SPINAL CORD STIMULATOR INSERTION N/A 10/30/2020   Procedure: SPINAL CORD STIMULATOR INSERTION;  Surgeon: Venita Lick, MD;  Location: [facility] OR;  Service: Orthopedics;  Laterality: N/A;   TOTAL LAPAROSCOPIC HYSTERECTOMY WITH SALPINGECTOMY Bilateral 09/11/2019   Procedure: TOTAL LAPAROSCOPIC HYSTERECTOMY WITH SALPINGECTOMY;  Surgeon: Nadara Mustard, MD;  Location: [facility] ORS;  Service: Gynecology;  Laterality: Bilateral;   Family History  Problem Relation Age of Onset   Hyperlipidemia Father    Diabetes Sister    Diabetes Maternal Grandmother    Cancer Maternal Grandmother        vaginal   Cirrhosis Maternal Grandmother    Colon polyps Maternal Grandmother    Breast cancer Maternal Grandmother        Early 18's   Social History   Socioeconomic History   Marital status: Married    Spouse name: Remi Deter   Number of children: 4   Years of education: Not on file   Highest education level: Not on file  Occupational History   Not on file  Tobacco Use   Smoking status: Former    Current packs/day: 1.00    Types: Cigarettes   Smokeless tobacco: Never  Vaping Use  Vaping status: Never Used  Substance and Sexual Activity   Alcohol use: Not Currently    Comment: rarely   Drug use: No   Sexual activity: Not on file  Other Topics Concern   Not on file  Social History Narrative   Not on file   Social Drivers of Health   Financial Resource Strain: Low Risk  (08/20/2020)   Overall Financial Resource Strain (CARDIA)    Difficulty of Paying Living Expenses: Not hard at all  Food Insecurity: Not on file  Transportation Needs: Not on file  Physical Activity: Insufficiently Active (10/11/2019)    Exercise Vital Sign    Days of Exercise per Week: 3 days    Minutes of Exercise per Session: 30 min  Stress: Not on file  Social Connections: Not on file     Review of Systems  Constitutional:  Positive for fatigue. Negative for appetite change and unexpected weight change.  HENT:  Negative for congestion and sinus pressure.   Respiratory:  Negative for cough, chest tightness and shortness of breath.   Cardiovascular:  Negative for chest pain and palpitations.  Gastrointestinal:  Positive for nausea. Negative for abdominal pain and vomiting.  Genitourinary:  Negative for difficulty urinating and dysuria.  Musculoskeletal:  Negative for joint swelling and myalgias.  Skin:  Negative for color change and rash.  Neurological:  Positive for dizziness.  Psychiatric/Behavioral:  Negative for agitation and dysphoric mood.        Increased stress.        Objective:     BP 130/80   Pulse 100   Temp 98 F (36.7 C)   Resp 16   Ht 5\' 2"  (1.575 m)   Wt 186 lb (84.4 kg)   SpO2 98%   BMI 34.02 kg/m  Wt Readings from Last 3 Encounters:  05/17/23 186 lb (84.4 kg)  03/01/23 179 lb 12.8 oz (81.6 kg)  12/17/22 170 lb (77.1 kg)    Physical Exam Vitals reviewed.  Constitutional:      General: She is not in acute distress.    Appearance: Normal appearance.  HENT:     Head: Normocephalic and atraumatic.     Right Ear: External ear normal.     Left Ear: External ear normal.     Mouth/Throat:     Pharynx: No oropharyngeal exudate or posterior oropharyngeal erythema.  Eyes:     General: No scleral icterus.       Right eye: No discharge.        Left eye: No discharge.     Conjunctiva/sclera: Conjunctivae normal.  Neck:     Thyroid: No thyromegaly.  Cardiovascular:     Rate and Rhythm: Normal rate and regular rhythm.  Pulmonary:     Effort: No respiratory distress.     Breath sounds: Normal breath sounds. No wheezing.  Abdominal:     General: Bowel sounds are normal.      Palpations: Abdomen is soft.     Tenderness: There is no abdominal tenderness.  Musculoskeletal:        General: No swelling or tenderness.     Cervical back: Neck supple. No tenderness.  Lymphadenopathy:     Cervical: No cervical adenopathy.  Skin:    Findings: No erythema or rash.  Neurological:     Mental Status: She is alert.  Psychiatric:        Mood and Affect: Mood normal.        Behavior: Behavior normal.  Outpatient Encounter Medications as of 05/17/2023  Medication Sig   amLODipine (NORVASC) 2.5 MG tablet Take 1 tablet (2.5 mg total) by mouth daily.   acyclovir (ZOVIRAX) 400 MG tablet TAKE ONE TABLET 3 TIMES A DAY X 5 DAYS AS NEEDED   amphetamine-dextroamphetamine (ADDERALL XR) 30 MG 24 hr capsule Take 30 mg by mouth daily.   amphetamine-dextroamphetamine (ADDERALL) 10 MG tablet Take 10 mg by mouth daily as needed (focus).   ASHWAGANDHA PO Take 1 tablet by mouth daily.   [employer] TRIPLE MAGNESIUM COMPLEX PO Take 1 tablet by mouth daily.   EMGALITY 120 MG/ML SOAJ Inject 1 mL into the skin every 30 (thirty) days.   estradiol (ESTRACE) 0.5 MG tablet TAKE 1 TABLET BY MOUTH EVERY DAY   fexofenadine (ALLEGRA) 60 MG tablet Take 60 mg by mouth 2 (two) times daily.    MAGNESIUM GLYCINATE PO Take 1 tablet by mouth daily.   pregabalin (LYRICA) 100 MG capsule Take 100 mg by mouth 2 (two) times daily.   rizatriptan (MAXALT) 10 MG tablet TAKE 1 TABLET (10 MG TOTAL) BY MOUTH DAILY AS NEEDED FOR MIGRAINE AS DIRECTED   sertraline (ZOLOFT) 50 MG tablet Take 50 mg by mouth daily.   traZODone (DESYREL) 50 MG tablet Take 50-100 mg by mouth at bedtime as needed.   venlafaxine XR (EFFEXOR-XR) 150 MG 24 hr capsule Take 150 mg by mouth every morning.   zinc gluconate 50 MG tablet Take 50 mg by mouth daily.    [DISCONTINUED] Semaglutide-Weight Management (WEGOVY) 0.25 MG/0.5ML SOAJ Inject 0.25 mg into the skin once a week.   No facility-administered encounter medications on file as of  05/17/2023.     Lab Results  Component Value Date   WBC 6.5 10/25/2022   HGB 13.1 10/25/2022   HCT 40.9 10/25/2022   PLT 339.0 10/25/2022   GLUCOSE 98 03/01/2023   CHOL 237 (H) 10/25/2022   TRIG 82.0 10/25/2022   HDL 90.20 10/25/2022   LDLDIRECT 132.6 01/29/2013   LDLCALC 130 (H) 10/25/2022   ALT 21 03/01/2023   AST 22 03/01/2023   NA 139 03/01/2023   K 3.7 03/01/2023   CL 103 03/01/2023   CREATININE 0.78 03/01/2023   BUN 14 03/01/2023   CO2 29 03/01/2023   TSH 1.33 03/01/2023   INR 0.9 10/27/2020    MM 3D DIAGNOSTIC MAMMOGRAM BILATERAL BREAST Result Date: 02/11/2023 CLINICAL DATA:  48 year old female presenting with bilateral intermittent nipple pain. She denies any nipple discharge. EXAM: DIGITAL DIAGNOSTIC BILATERAL MAMMOGRAM WITH TOMOSYNTHESIS AND CAD; ULTRASOUND RIGHT BREAST LIMITED; ULTRASOUND LEFT BREAST LIMITED TECHNIQUE: Bilateral digital diagnostic mammography and breast tomosynthesis was performed. The images were evaluated with computer-aided detection. ; Targeted ultrasound examination of the right breast was performed; Targeted ultrasound examination of the left breast was performed. COMPARISON:  Previous exam(s). ACR Breast Density Category c: The breasts are heterogeneously dense, which may obscure small masses. FINDINGS: Diagnostic mammographic images were obtained over the areas of tenderness in bilateral breasts. No suspicious mammographic finding is identified in these areas. No suspicious mass, microcalcification, or other finding is identified in bilateral breasts. Targeted bilateral retroareolar ultrasound was performed given history of nipple tenderness. No suspicious solid or cystic mass or intraductal mass is identified. IMPRESSION: 1. No suspicious mammographic or sonographic abnormality is identified to account for the patient's nipple tenderness. 2. No evidence of malignancy in bilateral breasts. RECOMMENDATION: Any further workup of the patient's symptoms  should be based on the clinical assessment. Recommend routine annual screening mammogram  in 1 year. I have discussed the findings and recommendations with the patient. If applicable, a reminder letter will be sent to the patient regarding the next appointment. BI-RADS CATEGORY  1: Negative. Electronically Signed   By: Jacob Moores M.D.   On: 02/11/2023 14:52   [place] LIMITED ULTRASOUND INCLUDING AXILLA LEFT BREAST  Result Date: 02/11/2023 CLINICAL DATA:  48 year old female presenting with bilateral intermittent nipple pain. She denies any nipple discharge. EXAM: DIGITAL DIAGNOSTIC BILATERAL MAMMOGRAM WITH TOMOSYNTHESIS AND CAD; ULTRASOUND RIGHT BREAST LIMITED; ULTRASOUND LEFT BREAST LIMITED TECHNIQUE: Bilateral digital diagnostic mammography and breast tomosynthesis was performed. The images were evaluated with computer-aided detection. ; Targeted ultrasound examination of the right breast was performed; Targeted ultrasound examination of the left breast was performed. COMPARISON:  Previous exam(s). ACR Breast Density Category c: The breasts are heterogeneously dense, which may obscure small masses. FINDINGS: Diagnostic mammographic images were obtained over the areas of tenderness in bilateral breasts. No suspicious mammographic finding is identified in these areas. No suspicious mass, microcalcification, or other finding is identified in bilateral breasts. Targeted bilateral retroareolar ultrasound was performed given history of nipple tenderness. No suspicious solid or cystic mass or intraductal mass is identified. IMPRESSION: 1. No suspicious mammographic or sonographic abnormality is identified to account for the patient's nipple tenderness. 2. No evidence of malignancy in bilateral breasts. RECOMMENDATION: Any further workup of the patient's symptoms should be based on the clinical assessment. Recommend routine annual screening mammogram in 1 year. I have discussed the findings and recommendations with the  patient. If applicable, a reminder letter will be sent to the patient regarding the next appointment. BI-RADS CATEGORY  1: Negative. Electronically Signed   By: Jacob Moores M.D.   On: 02/11/2023 14:52   [place] LIMITED ULTRASOUND INCLUDING AXILLA RIGHT BREAST Result Date: 02/11/2023 CLINICAL DATA:  48 year old female presenting with bilateral intermittent nipple pain. She denies any nipple discharge. EXAM: DIGITAL DIAGNOSTIC BILATERAL MAMMOGRAM WITH TOMOSYNTHESIS AND CAD; ULTRASOUND RIGHT BREAST LIMITED; ULTRASOUND LEFT BREAST LIMITED TECHNIQUE: Bilateral digital diagnostic mammography and breast tomosynthesis was performed. The images were evaluated with computer-aided detection. ; Targeted ultrasound examination of the right breast was performed; Targeted ultrasound examination of the left breast was performed. COMPARISON:  Previous exam(s). ACR Breast Density Category c: The breasts are heterogeneously dense, which may obscure small masses. FINDINGS: Diagnostic mammographic images were obtained over the areas of tenderness in bilateral breasts. No suspicious mammographic finding is identified in these areas. No suspicious mass, microcalcification, or other finding is identified in bilateral breasts. Targeted bilateral retroareolar ultrasound was performed given history of nipple tenderness. No suspicious solid or cystic mass or intraductal mass is identified. IMPRESSION: 1. No suspicious mammographic or sonographic abnormality is identified to account for the patient's nipple tenderness. 2. No evidence of malignancy in bilateral breasts. RECOMMENDATION: Any further workup of the patient's symptoms should be based on the clinical assessment. Recommend routine annual screening mammogram in 1 year. I have discussed the findings and recommendations with the patient. If applicable, a reminder letter will be sent to the patient regarding the next appointment. BI-RADS CATEGORY  1: Negative. Electronically Signed    By: Jacob Moores M.D.   On: 02/11/2023 14:52       Assessment & Plan:  Dizziness Assessment & Plan: Has seen neurology and ENT.  Previous MRI ok.  ENT w/up unrevealing.  Had discussed vestibular rehab.  Was doing better. Some dizziness now with intermittent "hot" feelings, nausea as outlined. Blood pressure elevated recently. Treat blood  pressure. Discussed with neurology regarding f/u and further w/up. Of note, EKG - SR with no acute ischemic changes.   Orders: -     EKG 12-Lead  GAD (generalized anxiety disorder) Assessment & Plan: Being followed by Dr Maryruth Bun.  On effexor and zoloft. She plans to discuss symptoms with Dr Maryruth Bun. Effexor can elevate blood pressure, but has been on effexor. Treat blood pressure. Hold on making changes in medication. D/w psych. Follow.    Nonintractable headache, unspecified chronicity pattern, unspecified headache type Assessment & Plan: Sees neurology for f/u migraine headaches. Continuing on emgality. Has maxalt to take prn. Botox, trial of nurtec.    Hot flashes Assessment & Plan: Symptoms as outlined. Is on estradiol. Discussed concern regarding elevated blood pressure, symptoms and estrogen. Treat blood pressure.    Hypercholesterolemia Assessment & Plan: The 10-year ASCVD risk score (Arnett DK, et al., 2019) is: 0.6%   Values used to calculate the score:     Age: 50 years     Sex: Female     Is Non-Hispanic African American: No     Diabetic: No     Tobacco smoker: No     Systolic Blood Pressure: 130 mmHg     Is BP treated: No     HDL Cholesterol: 90.2 mg/dL     Total Cholesterol: 237 mg/dL  Low cholesterol diet and exercise.  Follow lipid panel.    Hypertension, essential Assessment & Plan: Elevated blood pressure with associated symptoms. Start amlodipine 2.5mg  q day. Follow pressures.    Other orders -     amLODIPine Besylate; Take 1 tablet (2.5 mg total) by mouth daily.  Dispense: 30 tablet; Refill: 2     Dale Hunt, MD

## 2023-05-22 ENCOUNTER — Encounter: Payer: Self-pay | Admitting: Internal Medicine

## 2023-05-22 ENCOUNTER — Telehealth: Payer: Self-pay | Admitting: Internal Medicine

## 2023-05-22 ENCOUNTER — Other Ambulatory Visit: Payer: Self-pay | Admitting: Internal Medicine

## 2023-05-22 DIAGNOSIS — I10 Essential (primary) hypertension: Secondary | ICD-10-CM | POA: Insufficient documentation

## 2023-05-22 NOTE — Telephone Encounter (Signed)
 Ariel Morgan was recently evaluated and started on blood pressure medication. She was having issues with feeling hot, elevated blood pressure, etc. I would like to schedule her for a f/u appt with neurology. She has seen Dr Sherryll Burger, Wal[facility] Laine and [facility]..  needs appt asap. Please schedule.

## 2023-05-22 NOTE — Assessment & Plan Note (Signed)
 Symptoms as outlined. Is on estradiol. Discussed concern regarding elevated blood pressure, symptoms and estrogen. Treat blood pressure.

## 2023-05-22 NOTE — Assessment & Plan Note (Signed)
 Elevated blood pressure with associated symptoms. Start amlodipine 2.5mg  q day. Follow pressures.

## 2023-05-22 NOTE — Assessment & Plan Note (Signed)
 Sees neurology for f/u migraine headaches. Continuing on emgality. Has maxalt to take prn. Botox, trial of nurtec.

## 2023-05-22 NOTE — Assessment & Plan Note (Signed)
 Being followed by Dr Maryruth Bun.  On effexor and zoloft. She plans to discuss symptoms with Dr Maryruth Bun. Effexor can elevate blood pressure, but has been on effexor. Treat blood pressure. Hold on making changes in medication. D/w psych. Follow.

## 2023-05-22 NOTE — Assessment & Plan Note (Signed)
 The 10-year ASCVD risk score (Arnett DK, et al., 2019) is: 0.6%   Values used to calculate the score:     Age: 48 years     Sex: Female     Is Non-Hispanic African American: No     Diabetic: No     Tobacco smoker: No     Systolic Blood Pressure: 130 mmHg     Is BP treated: No     HDL Cholesterol: 90.2 mg/dL     Total Cholesterol: 237 mg/dL  Low cholesterol diet and exercise.  Follow lipid panel.

## 2023-05-22 NOTE — Assessment & Plan Note (Addendum)
 Has seen neurology and ENT.  Previous MRI ok.  ENT w/up unrevealing.  Had discussed vestibular rehab.  Was doing better. Some dizziness now with intermittent "hot" feelings, nausea as outlined. Blood pressure elevated recently. Treat blood pressure. Discussed with neurology regarding f/u and further w/up. Of note, EKG - SR with no acute ischemic changes.

## 2023-05-23 ENCOUNTER — Encounter: Payer: Self-pay | Admitting: Internal Medicine

## 2023-05-23 NOTE — Telephone Encounter (Signed)
 Can schedule a f/u appt - I can do a vaginal exam - confirm diagnosis, etc.

## 2023-05-23 NOTE — Telephone Encounter (Signed)
 See other note regarding follow up - work in appt.

## 2023-05-23 NOTE — Telephone Encounter (Signed)
 Patient was made aware of Dr Roby Lofts recommendations. Patient verbalized understanding and says she would reach out and get appointment scheduled with Neurology.

## 2023-05-24 ENCOUNTER — Encounter: Payer: Self-pay | Admitting: Internal Medicine

## 2023-06-02 ENCOUNTER — Ambulatory Visit (INDEPENDENT_AMBULATORY_CARE_PROVIDER_SITE_OTHER): Payer: 59 | Admitting: Internal Medicine

## 2023-06-02 ENCOUNTER — Encounter: Payer: Self-pay | Admitting: Internal Medicine

## 2023-06-02 ENCOUNTER — Other Ambulatory Visit (HOSPITAL_COMMUNITY)
Admission: RE | Admit: 2023-06-02 | Discharge: 2023-06-02 | Disposition: A | Discharge status: Home / Self Care | Source: Ambulatory Visit | Attending: Internal Medicine | Admitting: Internal Medicine

## 2023-06-02 VITALS — BP 122/76 | HR 88 | Temp 98.2°F | Resp 16 | Ht 67.0 in | Wt 189.4 lb

## 2023-06-02 DIAGNOSIS — Z0001 Encounter for general adult medical examination with abnormal findings: Secondary | ICD-10-CM | POA: Diagnosis not present

## 2023-06-02 DIAGNOSIS — R232 Flushing: Secondary | ICD-10-CM

## 2023-06-02 DIAGNOSIS — F411 Generalized anxiety disorder: Secondary | ICD-10-CM

## 2023-06-02 DIAGNOSIS — Z0000 Encounter for general adult medical examination without abnormal findings: Secondary | ICD-10-CM

## 2023-06-02 DIAGNOSIS — R519 Headache, unspecified: Secondary | ICD-10-CM

## 2023-06-02 DIAGNOSIS — R300 Dysuria: Secondary | ICD-10-CM | POA: Diagnosis not present

## 2023-06-02 DIAGNOSIS — N760 Acute vaginitis: Secondary | ICD-10-CM | POA: Diagnosis present

## 2023-06-02 DIAGNOSIS — I10 Essential (primary) hypertension: Secondary | ICD-10-CM

## 2023-06-02 DIAGNOSIS — E7800 Pure hypercholesterolemia, unspecified: Secondary | ICD-10-CM

## 2023-06-02 DIAGNOSIS — N898 Other specified noninflammatory disorders of vagina: Secondary | ICD-10-CM

## 2023-06-02 MED ORDER — AMLODIPINE BESYLATE 5 MG PO TABS: 5.0000 mg | ORAL_TABLET | Freq: Every day | ORAL | 3 refills | Status: DC

## 2023-06-02 NOTE — Progress Notes (Signed)
 Subjective:    Patient ID: Ariel Morgan, female    DOB: November 06, 1975, 48 y.o.   MRN: [id number]  Patient here for  Chief Complaint  Patient presents with   Annual Exam    HPI Here for a physical exam. Saw neurology 05/25/23 - f/u headaches - felt to be migraine. She reports she is feeling some better. Was started on amlodipine 2.5mg  last visit - for elevated blood pressure. Still feels blood pressure is varying. Discussed increasing dose of amlodipine. Breathing stable. Is having vaginal irritation. No dysuria. Has used monistat.    Past Medical History:  Diagnosis Date   Anemia    Anxiety    Arthritis    Chronic low back pain    Chronic migraine without aura    Depression    Frequent headaches    H/O   GERD (gastroesophageal reflux disease)    Herpes zoster    History of kidney stones    Hypercholesterolemia    Placenta previa    Right ureteral stone 12/2022   Past Surgical History:  Procedure Laterality Date   ABDOMINAL EXPOSURE N/A 01/01/2018   Procedure: ABDOMINAL EXPOSURE;  Surgeon: Cephus Shelling, MD;  Location: [facility] OR;  Service: Vascular;  Laterality: N/A;   ANTERIOR LUMBAR FUSION N/A 01/01/2018   Procedure: ANTERIOR LUMBAR FUSION L5-S1; REPLACEMENT OF posterior HARDWARE L4-S1;  Surgeon: Venita Lick, MD;  Location: [facility] OR;  Service: Orthopedics;  Laterality: N/A;   BREAST BIOPSY Right 05/10/2017   right breast stereotatic bx pash   CESAREAN SECTION  2003 & 2010   CHOLECYSTECTOMY  2005   CYSTOSCOPY  09/11/2019   Procedure: CYSTOSCOPY;  Surgeon: Nadara Mustard, MD;  Location: [facility] ORS;  Service: Gynecology;;   CYSTOSCOPY/URETEROSCOPY/HOLMIUM LASER/STENT PLACEMENT Right 12/17/2022   Procedure: CYSTOSCOPY/URETEROSCOPY/HOLMIUM LASER/STENT PLACEMENT;  Surgeon: Sondra Come, MD;  Location: [facility] ORS;  Service: Urology;  Laterality: Right;   LUMBAR LAMINECTOMY/DECOMPRESSION MICRODISCECTOMY N/A 12/28/2017   Procedure: L4-S1 decompression and fusion;   Surgeon: Venita Lick, MD;  Location: [facility] OR;  Service: Orthopedics;  Laterality: N/A;  4.5 hrs   SPINAL CORD STIMULATOR INSERTION N/A 10/30/2020   Procedure: SPINAL CORD STIMULATOR INSERTION;  Surgeon: Venita Lick, MD;  Location: [facility] OR;  Service: Orthopedics;  Laterality: N/A;   TOTAL LAPAROSCOPIC HYSTERECTOMY WITH SALPINGECTOMY Bilateral 09/11/2019   Procedure: TOTAL LAPAROSCOPIC HYSTERECTOMY WITH SALPINGECTOMY;  Surgeon: Nadara Mustard, MD;  Location: [facility] ORS;  Service: Gynecology;  Laterality: Bilateral;   Family History  Problem Relation Age of Onset   Hyperlipidemia Father    Diabetes Sister    Diabetes Maternal Grandmother    Cancer Maternal Grandmother        vaginal   Cirrhosis Maternal Grandmother    Colon polyps Maternal Grandmother    Breast cancer Maternal Grandmother        Early 20's   Social History   Socioeconomic History   Marital status: Married    Spouse name: Remi Deter   Number of children: 4   Years of education: Not on file   Highest education level: Not on file  Occupational History   Not on file  Tobacco Use   Smoking status: Former    Current packs/day: 1.00    Types: Cigarettes   Smokeless tobacco: Never  Vaping Use   Vaping status: Never Used  Substance and Sexual Activity   Alcohol use: Not Currently    Comment: rarely   Drug use: No   Sexual activity: Not on file  Other Topics Concern   Not on file  Social History Narrative   Not on file   Social Drivers of Health   Financial Resource Strain: Low Risk  (08/20/2020)   Overall Financial Resource Strain (CARDIA)    Difficulty of Paying Living Expenses: Not hard at all  Food Insecurity: Not on file  Transportation Needs: Not on file  Physical Activity: Insufficiently Active (10/11/2019)   Exercise Vital Sign    Days of Exercise per Week: 3 days    Minutes of Exercise per Session: 30 min  Stress: Not on file  Social Connections: Not on file     Review of Systems  Constitutional:   Negative for appetite change and unexpected weight change.  HENT:  Negative for congestion, sinus pressure and sore throat.   Eyes:  Negative for pain and visual disturbance.  Respiratory:  Negative for cough, chest tightness and shortness of breath.   Cardiovascular:  Negative for chest pain, palpitations and leg swelling.  Gastrointestinal:  Negative for abdominal pain, diarrhea, nausea and vomiting.  Genitourinary:  Negative for difficulty urinating and dysuria.       Vaginal irritation.   Musculoskeletal:  Negative for joint swelling and myalgias.  Skin:  Negative for color change and rash.  Neurological:  Positive for headaches. Negative for dizziness.  Hematological:  Negative for adenopathy. Does not bruise/bleed easily.  Psychiatric/Behavioral:  Negative for agitation and dysphoric mood.        Objective:     BP 122/76   Pulse 88   Temp 98.2 F (36.8 C)   Resp 16   Ht 5\' 7"  (1.702 m)   Wt 189 lb 6.4 oz (85.9 kg)   SpO2 97%   BMI 29.66 kg/m  Wt Readings from Last 3 Encounters:  06/02/23 189 lb 6.4 oz (85.9 kg)  05/17/23 186 lb (84.4 kg)  03/01/23 179 lb 12.8 oz (81.6 kg)    Physical Exam Vitals reviewed.  Constitutional:      General: She is not in acute distress.    Appearance: Normal appearance. She is well-developed.  HENT:     Head: Normocephalic and atraumatic.     Right Ear: External ear normal.     Left Ear: External ear normal.  Eyes:     General: No scleral icterus.       Right eye: No discharge.        Left eye: No discharge.     Conjunctiva/sclera: Conjunctivae normal.  Neck:     Thyroid: No thyromegaly.  Cardiovascular:     Rate and Rhythm: Normal rate and regular rhythm.  Pulmonary:     Effort: No tachypnea, accessory muscle usage or respiratory distress.     Breath sounds: Normal breath sounds. No decreased breath sounds or wheezing.  Chest:  Breasts:    Right: No inverted nipple, mass, nipple discharge or tenderness (no axillary  adenopathy).     Left: No inverted nipple, mass, nipple discharge or tenderness (no axilarry adenopathy).  Abdominal:     General: Bowel sounds are normal.     Palpations: Abdomen is soft.     Tenderness: There is no abdominal tenderness.  Genitourinary:    Comments: Normal external genitalia.  Vaginal vault without lesions. Vaginal swab - KOH/wet prep obtained.  Could not appreciate any adnexal masses or tenderness.   Musculoskeletal:        General: No swelling or tenderness.     Cervical back: Neck supple.  Lymphadenopathy:     Cervical:  No cervical adenopathy.  Skin:    Findings: No erythema or rash.  Neurological:     Mental Status: She is alert and oriented to person, place, and time.  Psychiatric:        Mood and Affect: Mood normal.        Behavior: Behavior normal.         Outpatient Encounter Medications as of 06/02/2023  Medication Sig   amLODipine (NORVASC) 5 MG tablet Take 1 tablet (5 mg total) by mouth daily.   acyclovir (ZOVIRAX) 400 MG tablet TAKE ONE TABLET 3 TIMES A DAY X 5 DAYS AS NEEDED   amphetamine-dextroamphetamine (ADDERALL XR) 30 MG 24 hr capsule Take 30 mg by mouth daily.   amphetamine-dextroamphetamine (ADDERALL) 10 MG tablet Take 10 mg by mouth daily as needed (focus).   EMGALITY 120 MG/ML SOAJ Inject 1 mL into the skin every 30 (thirty) days.   estradiol (ESTRACE) 0.5 MG tablet TAKE 1 TABLET BY MOUTH EVERY DAY   fexofenadine (ALLEGRA) 60 MG tablet Take 60 mg by mouth 2 (two) times daily.    MAGNESIUM GLYCINATE PO Take 1 tablet by mouth daily.   pregabalin (LYRICA) 100 MG capsule Take 100 mg by mouth 2 (two) times daily.   sertraline (ZOLOFT) 50 MG tablet Take 50 mg by mouth daily.   traZODone (DESYREL) 50 MG tablet Take 50-100 mg by mouth at bedtime as needed.   venlafaxine XR (EFFEXOR-XR) 150 MG 24 hr capsule Take 150 mg by mouth every morning.   [DISCONTINUED] amLODipine (NORVASC) 2.5 MG tablet Take 1 tablet (2.5 mg total) by mouth daily.    [DISCONTINUED] ASHWAGANDHA PO Take 1 tablet by mouth daily.   [DISCONTINUED] [employer] TRIPLE MAGNESIUM COMPLEX PO Take 1 tablet by mouth daily.   [DISCONTINUED] rizatriptan (MAXALT) 10 MG tablet TAKE 1 TABLET (10 MG TOTAL) BY MOUTH DAILY AS NEEDED FOR MIGRAINE AS DIRECTED   [DISCONTINUED] zinc gluconate 50 MG tablet Take 50 mg by mouth daily.    No facility-administered encounter medications on file as of 06/02/2023.     Lab Results  Component Value Date   WBC 6.5 10/25/2022   HGB 13.1 10/25/2022   HCT 40.9 10/25/2022   PLT 339.0 10/25/2022   GLUCOSE 98 03/01/2023   CHOL 237 (H) 10/25/2022   TRIG 82.0 10/25/2022   HDL 90.20 10/25/2022   LDLDIRECT 132.6 01/29/2013   LDLCALC 130 (H) 10/25/2022   ALT 21 03/01/2023   AST 22 03/01/2023   NA 139 03/01/2023   K 3.7 03/01/2023   CL 103 03/01/2023   CREATININE 0.78 03/01/2023   BUN 14 03/01/2023   CO2 29 03/01/2023   TSH 1.33 03/01/2023   INR 0.9 10/27/2020    MM 3D DIAGNOSTIC MAMMOGRAM BILATERAL BREAST Result Date: 02/11/2023 CLINICAL DATA:  48 year old female presenting with bilateral intermittent nipple pain. She denies any nipple discharge. EXAM: DIGITAL DIAGNOSTIC BILATERAL MAMMOGRAM WITH TOMOSYNTHESIS AND CAD; ULTRASOUND RIGHT BREAST LIMITED; ULTRASOUND LEFT BREAST LIMITED TECHNIQUE: Bilateral digital diagnostic mammography and breast tomosynthesis was performed. The images were evaluated with computer-aided detection. ; Targeted ultrasound examination of the right breast was performed; Targeted ultrasound examination of the left breast was performed. COMPARISON:  Previous exam(s). ACR Breast Density Category c: The breasts are heterogeneously dense, which may obscure small masses. FINDINGS: Diagnostic mammographic images were obtained over the areas of tenderness in bilateral breasts. No suspicious mammographic finding is identified in these areas. No suspicious mass, microcalcification, or other finding is identified in bilateral breasts.  Targeted  bilateral retroareolar ultrasound was performed given history of nipple tenderness. No suspicious solid or cystic mass or intraductal mass is identified. IMPRESSION: 1. No suspicious mammographic or sonographic abnormality is identified to account for the patient's nipple tenderness. 2. No evidence of malignancy in bilateral breasts. RECOMMENDATION: Any further workup of the patient's symptoms should be based on the clinical assessment. Recommend routine annual screening mammogram in 1 year. I have discussed the findings and recommendations with the patient. If applicable, a reminder letter will be sent to the patient regarding the next appointment. BI-RADS CATEGORY  1: Negative. Electronically Signed   By: Jacob Moores M.D.   On: 02/11/2023 14:52   [place] LIMITED ULTRASOUND INCLUDING AXILLA LEFT BREAST  Result Date: 02/11/2023 CLINICAL DATA:  48 year old female presenting with bilateral intermittent nipple pain. She denies any nipple discharge. EXAM: DIGITAL DIAGNOSTIC BILATERAL MAMMOGRAM WITH TOMOSYNTHESIS AND CAD; ULTRASOUND RIGHT BREAST LIMITED; ULTRASOUND LEFT BREAST LIMITED TECHNIQUE: Bilateral digital diagnostic mammography and breast tomosynthesis was performed. The images were evaluated with computer-aided detection. ; Targeted ultrasound examination of the right breast was performed; Targeted ultrasound examination of the left breast was performed. COMPARISON:  Previous exam(s). ACR Breast Density Category c: The breasts are heterogeneously dense, which may obscure small masses. FINDINGS: Diagnostic mammographic images were obtained over the areas of tenderness in bilateral breasts. No suspicious mammographic finding is identified in these areas. No suspicious mass, microcalcification, or other finding is identified in bilateral breasts. Targeted bilateral retroareolar ultrasound was performed given history of nipple tenderness. No suspicious solid or cystic mass or intraductal mass is  identified. IMPRESSION: 1. No suspicious mammographic or sonographic abnormality is identified to account for the patient's nipple tenderness. 2. No evidence of malignancy in bilateral breasts. RECOMMENDATION: Any further workup of the patient's symptoms should be based on the clinical assessment. Recommend routine annual screening mammogram in 1 year. I have discussed the findings and recommendations with the patient. If applicable, a reminder letter will be sent to the patient regarding the next appointment. BI-RADS CATEGORY  1: Negative. Electronically Signed   By: Jacob Moores M.D.   On: 02/11/2023 14:52   [place] LIMITED ULTRASOUND INCLUDING AXILLA RIGHT BREAST Result Date: 02/11/2023 CLINICAL DATA:  48 year old female presenting with bilateral intermittent nipple pain. She denies any nipple discharge. EXAM: DIGITAL DIAGNOSTIC BILATERAL MAMMOGRAM WITH TOMOSYNTHESIS AND CAD; ULTRASOUND RIGHT BREAST LIMITED; ULTRASOUND LEFT BREAST LIMITED TECHNIQUE: Bilateral digital diagnostic mammography and breast tomosynthesis was performed. The images were evaluated with computer-aided detection. ; Targeted ultrasound examination of the right breast was performed; Targeted ultrasound examination of the left breast was performed. COMPARISON:  Previous exam(s). ACR Breast Density Category c: The breasts are heterogeneously dense, which may obscure small masses. FINDINGS: Diagnostic mammographic images were obtained over the areas of tenderness in bilateral breasts. No suspicious mammographic finding is identified in these areas. No suspicious mass, microcalcification, or other finding is identified in bilateral breasts. Targeted bilateral retroareolar ultrasound was performed given history of nipple tenderness. No suspicious solid or cystic mass or intraductal mass is identified. IMPRESSION: 1. No suspicious mammographic or sonographic abnormality is identified to account for the patient's nipple tenderness. 2. No evidence  of malignancy in bilateral breasts. RECOMMENDATION: Any further workup of the patient's symptoms should be based on the clinical assessment. Recommend routine annual screening mammogram in 1 year. I have discussed the findings and recommendations with the patient. If applicable, a reminder letter will be sent to the patient regarding the next appointment. BI-RADS CATEGORY  1: Negative.  Electronically Signed   By: Jacob Moores M.D.   On: 02/11/2023 14:52       Assessment & Plan:  Routine general medical examination at a health care facility  Acute vaginitis -     Cervicovaginal ancillary only  Dysuria -     Urinalysis, Routine w reflex microscopic -     Urine Culture  Hypertension, essential Assessment & Plan: Blood pressure still varying - 130s/80s. Increase amlodipine to 5mg  q day. Follow pressures. Follow metabolic panel.    Hypercholesterolemia Assessment & Plan: The 10-year ASCVD risk score (Arnett DK, et al., 2019) is: 0.7%   Values used to calculate the score:     Age: 67 years     Sex: Female     Is Non-Hispanic African American: No     Diabetic: No     Tobacco smoker: No     Systolic Blood Pressure: 122 mmHg     Is BP treated: Yes     HDL Cholesterol: 90.2 mg/dL     Total Cholesterol: 237 mg/dL  Low cholesterol diet and exercise. Follow lipid panel.    Hot flashes Assessment & Plan: Discussed. Treat blood pressure. Wants to follow with better blood pressure control.    Health care maintenance Assessment & Plan: Physical today 06/01/23.  PAP 04/30/22 - negative with negative HPV. Mammogram 02/11/23 - Briads I.  Colonoscopy 11/16/22.    Nonintractable headache, unspecified chronicity pattern, unspecified headache type Assessment & Plan: Saw neurology. F/u migraine headaches.    GAD (generalized anxiety disorder) Assessment & Plan: Being followed by Dr Maryruth Bun. On effexor and zoloft. Overall appears to be stable currently.    Vaginal irritation Assessment &  Plan: KOH/wet prep - obtained. Further treatment pending results.    Other orders -     amLODIPine Besylate; Take 1 tablet (5 mg total) by mouth daily.  Dispense: 30 tablet; Refill: 3     Dale Darrington, MD

## 2023-06-03 LAB — URINALYSIS, ROUTINE W REFLEX MICROSCOPIC
Bilirubin Urine: NEGATIVE
Hgb urine dipstick: NEGATIVE
Ketones, ur: NEGATIVE
Leukocytes,Ua: NEGATIVE
Nitrite: NEGATIVE
RBC / HPF: NONE SEEN (ref 0–?)
Specific Gravity, Urine: 1.02 (ref 1.000–1.030)
Total Protein, Urine: NEGATIVE
Urine Glucose: NEGATIVE
Urobilinogen, UA: 0.2 (ref 0.0–1.0)
WBC, UA: NONE SEEN (ref 0–?)
pH: 6 (ref 5.0–8.0)

## 2023-06-03 LAB — URINE CULTURE
MICRO NUMBER:: 16138056
Result:: NO GROWTH
SPECIMEN QUALITY:: ADEQUATE

## 2023-06-05 ENCOUNTER — Encounter: Payer: Self-pay | Admitting: Internal Medicine

## 2023-06-05 DIAGNOSIS — N898 Other specified noninflammatory disorders of vagina: Secondary | ICD-10-CM | POA: Insufficient documentation

## 2023-06-05 NOTE — Assessment & Plan Note (Signed)
 KOH/wet prep - obtained. Further treatment pending results.

## 2023-06-05 NOTE — Assessment & Plan Note (Signed)
 Being followed by Dr Maryruth Bun. On effexor and zoloft. Overall appears to be stable currently.

## 2023-06-05 NOTE — Assessment & Plan Note (Signed)
 Discussed. Treat blood pressure. Wants to follow with better blood pressure control.

## 2023-06-05 NOTE — Assessment & Plan Note (Signed)
 Blood pressure still varying - 130s/80s. Increase amlodipine to 5mg  q day. Follow pressures. Follow metabolic panel.

## 2023-06-05 NOTE — Assessment & Plan Note (Signed)
 Physical today 06/01/23.  PAP 04/30/22 - negative with negative HPV. Mammogram 02/11/23 - Briads I.  Colonoscopy 11/16/22.

## 2023-06-05 NOTE — Assessment & Plan Note (Addendum)
 The 10-year ASCVD risk score (Arnett DK, et al., 2019) is: 0.7%   Values used to calculate the score:     Age: 48 years     Sex: Female     Is Non-Hispanic African American: No     Diabetic: No     Tobacco smoker: No     Systolic Blood Pressure: 122 mmHg     Is BP treated: Yes     HDL Cholesterol: 90.2 mg/dL     Total Cholesterol: 237 mg/dL  Low cholesterol diet and exercise. Follow lipid panel.

## 2023-06-05 NOTE — Assessment & Plan Note (Signed)
 Saw neurology. F/u migraine headaches.

## 2023-06-09 LAB — CERVICOVAGINAL ANCILLARY ONLY
Bacterial Vaginitis (gardnerella): NEGATIVE
Candida Glabrata: NEGATIVE
Candida Vaginitis: NEGATIVE
Comment: NEGATIVE
Comment: NEGATIVE
Comment: NEGATIVE

## 2023-06-10 ENCOUNTER — Telehealth: Payer: Self-pay

## 2023-06-10 NOTE — Telephone Encounter (Signed)
 See result note.

## 2023-06-10 NOTE — Telephone Encounter (Signed)
 Noted.

## 2023-06-10 NOTE — Telephone Encounter (Signed)
 Copied from CRM [id number]. Topic: General - Other >> Jun 10, 2023  9:44 AM Gurney Maxin H wrote: Reason for CRM: Patient is returning call to clinic to [facility], please reach back out to patient, thanks.  Njeri [phone]

## 2023-06-10 NOTE — Telephone Encounter (Signed)
 Copied from CRM [id number]. Topic: General - Other >> Jun 10, 2023 11:08 AM Melissa C wrote: Reason for CRM: patient was calling back for Trish, however I saw in the notes that message could be relayed to her regarding her results and so I gave her the results message and she stated that she is not having acute symptoms. Thank you.

## 2023-07-12 ENCOUNTER — Other Ambulatory Visit: Payer: Self-pay | Admitting: Internal Medicine

## 2023-07-31 NOTE — Progress Notes (Unsigned)
 Subjective:    Patient ID: Ariel Morgan, female    DOB: October 10, 1975, 48 y.o.   MRN: [id number]  Patient here for No chief complaint on file.   HPI Here for a scheduled follow up.  Saw neurology 05/25/23 - f/u headaches - felt to be migraine. She reports she is feeling some better. Was started on amlodipine . Amlodipine  was increased to 5mg  q day last visit. Receiving botox injections for migraine treatment.    Past Medical History:  Diagnosis Date   Anemia    Anxiety    Arthritis    Chronic low back pain    Chronic migraine without aura    Depression    Frequent headaches    H/O   GERD (gastroesophageal reflux disease)    Herpes zoster    History of kidney stones    Hypercholesterolemia    Placenta previa    Right ureteral stone 12/2022   Past Surgical History:  Procedure Laterality Date   ABDOMINAL EXPOSURE N/A 01/01/2018   Procedure: ABDOMINAL EXPOSURE;  Surgeon: Young Hensen, MD;  Location: [facility] OR;  Service: Vascular;  Laterality: N/A;   ANTERIOR LUMBAR FUSION N/A 01/01/2018   Procedure: ANTERIOR LUMBAR FUSION L5-S1; REPLACEMENT OF posterior HARDWARE L4-S1;  Surgeon: Mort Ards, MD;  [facility] OR;  Service: Orthopedics;  Laterality: N/A;   BREAST BIOPSY Right 05/10/2017   right breast stereotatic bx pash   CESAREAN SECTION  2003 & 2010   CHOLECYSTECTOMY  2005   CYSTOSCOPY  09/11/2019   Procedure: CYSTOSCOPY;  Surgeon: Alben Alma, MD;  Location: [facility] ORS;  Service: Gynecology;;   CYSTOSCOPY/URETEROSCOPY/HOLMIUM LASER/STENT PLACEMENT Right 12/17/2022   Procedure: CYSTOSCOPY/URETEROSCOPY/HOLMIUM LASER/STENT PLACEMENT;  Surgeon: Lawerence Pressman, MD;  Location: [facility] ORS;  Service: Urology;  Laterality: Right;   LUMBAR LAMINECTOMY/DECOMPRESSION MICRODISCECTOMY N/A 12/28/2017   Procedure: L4-S1 decompression and fusion;  Surgeon: Mort Ards, MD;  Location: [facility] OR;  Service: Orthopedics;  Laterality: N/A;  4.5 hrs   SPINAL CORD STIMULATOR INSERTION  N/A 10/30/2020   Procedure: SPINAL CORD STIMULATOR INSERTION;  Surgeon: Mort Ards, MD;  Location: [facility] OR;  Service: Orthopedics;  Laterality: N/A;   TOTAL LAPAROSCOPIC HYSTERECTOMY WITH SALPINGECTOMY Bilateral 09/11/2019   Procedure: TOTAL LAPAROSCOPIC HYSTERECTOMY WITH SALPINGECTOMY;  Surgeon: Alben Alma, MD;  Location: [facility] ORS;  Service: Gynecology;  Laterality: Bilateral;   Family History  Problem Relation Age of Onset   Hyperlipidemia Father    Diabetes Sister    Diabetes Maternal Grandmother    Cancer Maternal Grandmother        vaginal   Cirrhosis Maternal Grandmother    Colon polyps Maternal Grandmother    Breast cancer Maternal Grandmother        Early 35's   Social History   Socioeconomic History   Marital status: Married    Spouse name: Hilario Lover   Number of children: 4   Years of education: Not on file   Highest education level: Not on file  Occupational History   Not on file  Tobacco Use   Smoking status: Former    Current packs/day: 1.00    Types: Cigarettes   Smokeless tobacco: Never  Vaping Use   Vaping status: Never Used  Substance and Sexual Activity   Alcohol use: Not Currently    Comment: rarely   Drug use: No   Sexual activity: Not on file  Other Topics Concern   Not on file  Social History Narrative   Not on file   Social  Drivers of Health   Financial Resource Strain: Low Risk  (08/20/2020)   Overall Financial Resource Strain (CARDIA)    Difficulty of Paying Living Expenses: Not hard at all  Food Insecurity: Not on file  Transportation Needs: Not on file  Physical Activity: Insufficiently Active (10/11/2019)   Exercise Vital Sign    Days of Exercise per Week: 3 days    Minutes of Exercise per Session: 30 min  Stress: Not on file  Social Connections: Not on file     Review of Systems     Objective:     There were no vitals taken for this visit. Wt Readings from Last 3 Encounters:  06/02/23 189 lb 6.4 oz (85.9 kg)  05/17/23  186 lb (84.4 kg)  03/01/23 179 lb 12.8 oz (81.6 kg)    Physical Exam  {Perform Simple Foot Exam  Perform Detailed exam:1} {Insert foot Exam (Optional):30965}   Outpatient Encounter Medications as of 08/01/2023  Medication Sig   acyclovir  (ZOVIRAX ) 400 MG tablet TAKE ONE TABLET 3 TIMES A DAY X 5 DAYS AS NEEDED   amLODipine  (NORVASC ) 5 MG tablet Take 1 tablet (5 mg total) by mouth daily.   amphetamine-dextroamphetamine (ADDERALL XR) 30 MG 24 hr capsule Take 30 mg by mouth daily.   amphetamine-dextroamphetamine (ADDERALL) 10 MG tablet Take 10 mg by mouth daily as needed (focus).   EMGALITY 120 MG/ML SOAJ Inject 1 mL into the skin every 30 (thirty) days.   estradiol  (ESTRACE ) 0.5 MG tablet TAKE 1 TABLET BY MOUTH EVERY DAY   fexofenadine (ALLEGRA) 60 MG tablet Take 60 mg by mouth 2 (two) times daily.    MAGNESIUM  GLYCINATE PO Take 1 tablet by mouth daily.   pregabalin  (LYRICA ) 100 MG capsule Take 100 mg by mouth 2 (two) times daily.   sertraline (ZOLOFT) 50 MG tablet Take 50 mg by mouth daily.   traZODone  (DESYREL ) 50 MG tablet Take 50-100 mg by mouth at bedtime as needed.   venlafaxine  XR (EFFEXOR -XR) 150 MG 24 hr capsule Take 150 mg by mouth every morning.   No facility-administered encounter medications on file as of 08/01/2023.     Lab Results  Component Value Date   WBC 6.5 10/25/2022   HGB 13.1 10/25/2022   HCT 40.9 10/25/2022   PLT 339.0 10/25/2022   GLUCOSE 98 03/01/2023   CHOL 237 (H) 10/25/2022   TRIG 82.0 10/25/2022   HDL 90.20 10/25/2022   LDLDIRECT 132.6 01/29/2013   LDLCALC 130 (H) 10/25/2022   ALT 21 03/01/2023   AST 22 03/01/2023   NA 139 03/01/2023   K 3.7 03/01/2023   CL 103 03/01/2023   CREATININE 0.78 03/01/2023   BUN 14 03/01/2023   CO2 29 03/01/2023   TSH 1.33 03/01/2023   INR 0.9 10/27/2020    No results found.     Assessment & Plan:  Hypercholesterolemia     Dellar Fenton, MD

## 2023-08-01 ENCOUNTER — Ambulatory Visit (INDEPENDENT_AMBULATORY_CARE_PROVIDER_SITE_OTHER): Payer: 59 | Admitting: Internal Medicine

## 2023-08-01 ENCOUNTER — Encounter: Payer: Self-pay | Admitting: Internal Medicine

## 2023-08-01 DIAGNOSIS — R519 Headache, unspecified: Secondary | ICD-10-CM

## 2023-08-01 DIAGNOSIS — E7800 Pure hypercholesterolemia, unspecified: Secondary | ICD-10-CM

## 2023-08-01 NOTE — Progress Notes (Signed)
 Patient ID: Ariel Morgan, female   DOB: [birth year], 48 y.o.   MRN: [id number] Did not show for appt.

## 2023-08-31 ENCOUNTER — Other Ambulatory Visit: Payer: Self-pay | Admitting: Internal Medicine

## 2023-09-20 ENCOUNTER — Other Ambulatory Visit: Payer: Self-pay | Admitting: Internal Medicine

## 2023-09-25 NOTE — Telephone Encounter (Signed)
 Rx ok'd for acyclovir.

## 2023-09-29 ENCOUNTER — Ambulatory Visit (INDEPENDENT_AMBULATORY_CARE_PROVIDER_SITE_OTHER): Admitting: Internal Medicine

## 2023-09-29 VITALS — BP 108/68 | HR 89 | Temp 98.0°F | Resp 16 | Ht 62.0 in | Wt 187.0 lb

## 2023-09-29 DIAGNOSIS — F411 Generalized anxiety disorder: Secondary | ICD-10-CM | POA: Diagnosis not present

## 2023-09-29 DIAGNOSIS — R519 Headache, unspecified: Secondary | ICD-10-CM

## 2023-09-29 DIAGNOSIS — M79673 Pain in unspecified foot: Secondary | ICD-10-CM

## 2023-09-29 DIAGNOSIS — I10 Essential (primary) hypertension: Secondary | ICD-10-CM

## 2023-09-29 DIAGNOSIS — E7800 Pure hypercholesterolemia, unspecified: Secondary | ICD-10-CM

## 2023-09-29 NOTE — Progress Notes (Signed)
 Subjective:    Patient ID: Ariel Morgan, female    DOB: 05-Jun-1975, 48 y.o.   MRN: [id number]  Patient here for  Chief Complaint  Patient presents with   Medical Management of Chronic Issues    HPI Here for a scheduled follow up - follow up regarding increased stress and hypertension. Last visit, persistent elevation in blood pressure. Amlodipine  increased to 5mg  q day. Tolerating. Blood pressure doing better. History of headaches. Saw neurology 05/25/23 - f/u headaches - felt to be migraine. Last visit 07/25/23 - s/p botox injection. She does feel botox injections help. Some nasal congestion. Discussed steroid nasal spray and saline nasal spray. Breathing stable. No abdominal pain. Bowels moving.    Past Medical History:  Diagnosis Date   Anemia    Anxiety    Arthritis    Chronic low back pain    Chronic migraine without aura    Depression    Frequent headaches    H/O   GERD (gastroesophageal reflux disease)    Herpes zoster    History of kidney stones    Hypercholesterolemia    Placenta previa    Right ureteral stone 12/2022   Past Surgical History:  Procedure Laterality Date   ABDOMINAL EXPOSURE N/A 01/01/2018   Procedure: ABDOMINAL EXPOSURE;  Surgeon: Gretta Lonni PARAS, MD;  Location: [facility] OR;  Service: Vascular;  Laterality: N/A;   ANTERIOR LUMBAR FUSION N/A 01/01/2018   Procedure: ANTERIOR LUMBAR FUSION L5-S1; REPLACEMENT OF posterior HARDWARE L4-S1;  Surgeon: Burnetta Aures, MD;  Location: [facility] OR;  Service: Orthopedics;  Laterality: N/A;   BREAST BIOPSY Right 05/10/2017   right breast stereotatic bx pash   CESAREAN SECTION  2003 & 2010   CHOLECYSTECTOMY  2005   CYSTOSCOPY  09/11/2019   Procedure: CYSTOSCOPY;  Surgeon: Arloa Lamar SQUIBB, MD;  Location: [facility] ORS;  Service: Gynecology;;   CYSTOSCOPY/URETEROSCOPY/HOLMIUM LASER/STENT PLACEMENT Right 12/17/2022   Procedure: CYSTOSCOPY/URETEROSCOPY/HOLMIUM LASER/STENT PLACEMENT;  Surgeon: Francisca Redell BROCKS, MD;   Location: [facility] ORS;  Service: Urology;  Laterality: Right;   LUMBAR LAMINECTOMY/DECOMPRESSION MICRODISCECTOMY N/A 12/28/2017   Procedure: L4-S1 decompression and fusion;  Surgeon: Burnetta Aures, MD;  Location: [facility] OR;  Service: Orthopedics;  Laterality: N/A;  4.5 hrs   SPINAL CORD STIMULATOR INSERTION N/A 10/30/2020   Procedure: SPINAL CORD STIMULATOR INSERTION;  Surgeon: Burnetta Aures, MD;  Location: [facility] OR;  Service: Orthopedics;  Laterality: N/A;   TOTAL LAPAROSCOPIC HYSTERECTOMY WITH SALPINGECTOMY Bilateral 09/11/2019   Procedure: TOTAL LAPAROSCOPIC HYSTERECTOMY WITH SALPINGECTOMY;  Surgeon: Arloa Lamar SQUIBB, MD;  Location: [facility] ORS;  Service: Gynecology;  Laterality: Bilateral;   Family History  Problem Relation Age of Onset   Hyperlipidemia Father    Diabetes Sister    Diabetes Maternal Grandmother    Cancer Maternal Grandmother        vaginal   Cirrhosis Maternal Grandmother    Colon polyps Maternal Grandmother    Breast cancer Maternal Grandmother        Early 21's   Social History   Socioeconomic History   Marital status: Married    Spouse name: Jayson   Number of children: 4   Years of education: Not on file   Highest education level: Master's degree (e.g., MA, MS, MEng, MEd, MSW, MBA)  Occupational History   Not on file  Tobacco Use   Smoking status: Former    Current packs/day: 1.00    Types: Cigarettes   Smokeless tobacco: Never  Vaping Use   Vaping status: Never Used  Substance and Sexual Activity   Alcohol use: Not Currently    Comment: rarely   Drug use: No   Sexual activity: Not on file  Other Topics Concern   Not on file  Social History Narrative   Not on file   Social Drivers of Health   Financial Resource Strain: Low Risk  (09/29/2023)   Overall Financial Resource Strain (CARDIA)    Difficulty of Paying Living Expenses: Not hard at all  Food Insecurity: No Food Insecurity (09/29/2023)   Hunger Vital Sign    Worried About Running Out of Food in  the Last Year: Never true    Ran Out of Food in the Last Year: Never true  Transportation Needs: No Transportation Needs (09/29/2023)   PRAPARE - Administrator, Civil Service (Medical): No    Lack of Transportation (Non-Medical): No  Physical Activity: Insufficiently Active (09/29/2023)   Exercise Vital Sign    Days of Exercise per Week: 3 days    Minutes of Exercise per Session: 20 min  Stress: Stress Concern Present (09/29/2023)   [employer] of Occupational Health - Occupational Stress Questionnaire    Feeling of Stress: To some extent  Social Connections: Socially Integrated (09/29/2023)   Social Connection and Isolation Panel    Frequency of Communication with Friends and Family: More than three times a week    Frequency of Social Gatherings with Friends and Family: Once a week    Attends Religious Services: More than 4 times per year    Active Member of [employer] or Organizations: Yes    Attends Banker Meetings: Patient declined    Marital Status: Married     Review of Systems  Constitutional:  Negative for appetite change and unexpected weight change.  HENT:  Positive for congestion. Negative for sinus pressure.   Respiratory:  Negative for cough, chest tightness and shortness of breath.   Cardiovascular:  Negative for chest pain, palpitations and leg swelling.  Gastrointestinal:  Negative for abdominal pain, diarrhea, nausea and vomiting.  Genitourinary:  Negative for difficulty urinating and dysuria.  Musculoskeletal:  Negative for joint swelling and myalgias.       Pain - feet - bunions.   Skin:  Negative for color change and rash.  Neurological:  Positive for headaches. Negative for dizziness.  Psychiatric/Behavioral:  Negative for agitation and dysphoric mood.        Objective:     BP 108/68   Pulse 89   Temp 98 F (36.7 C)   Resp 16   Ht 5' 2 (1.575 m)   Wt 187 lb (84.8 kg)   SpO2 98%   BMI 34.20 kg/m  Wt Readings from Last 3  Encounters:  09/29/23 187 lb (84.8 kg)  06/02/23 189 lb 6.4 oz (85.9 kg)  05/17/23 186 lb (84.4 kg)    Physical Exam Vitals reviewed.  Constitutional:      General: She is not in acute distress.    Appearance: Normal appearance.  HENT:     Head: Normocephalic and atraumatic.     Right Ear: External ear normal.     Left Ear: External ear normal.     Mouth/Throat:     Pharynx: No oropharyngeal exudate or posterior oropharyngeal erythema.   Eyes:     General: No scleral icterus.       Right eye: No discharge.        Left eye: No discharge.     Conjunctiva/sclera: Conjunctivae normal.  Neck:     Thyroid : No thyromegaly.   Cardiovascular:     Rate and Rhythm: Normal rate and regular rhythm.  Pulmonary:     Effort: No respiratory distress.     Breath sounds: Normal breath sounds. No wheezing.  Abdominal:     General: Bowel sounds are normal.     Palpations: Abdomen is soft.     Tenderness: There is no abdominal tenderness.   Musculoskeletal:        General: No swelling or tenderness.     Cervical back: Neck supple. No tenderness.  Lymphadenopathy:     Cervical: No cervical adenopathy.   Skin:    Findings: No erythema or rash.   Neurological:     Mental Status: She is alert.   Psychiatric:        Mood and Affect: Mood normal.        Behavior: Behavior normal.         Outpatient Encounter Medications as of 09/29/2023  Medication Sig   NURTEC 75 MG TBDP Take 1 tablet by mouth daily as needed.   sertraline (ZOLOFT) 100 MG tablet Take 100 mg by mouth daily.   venlafaxine  XR (EFFEXOR -XR) 75 MG 24 hr capsule Take 75 mg by mouth daily.   acyclovir  (ZOVIRAX ) 400 MG tablet TAKE ONE TABLET 3 TIMES A DAY FOR 5 DAYS AS NEEDED   amLODipine  (NORVASC ) 5 MG tablet TAKE 1 TABLET (5 MG TOTAL) BY MOUTH DAILY.   amphetamine-dextroamphetamine (ADDERALL XR) 30 MG 24 hr capsule Take 30 mg by mouth daily.   amphetamine-dextroamphetamine (ADDERALL) 10 MG tablet Take 10 mg by mouth  daily as needed (focus).   EMGALITY 120 MG/ML SOAJ Inject 1 mL into the skin every 30 (thirty) days.   estradiol  (ESTRACE ) 0.5 MG tablet Take 1 tablet (0.5 mg total) by mouth daily.   fexofenadine (ALLEGRA) 60 MG tablet Take 60 mg by mouth 2 (two) times daily.    MAGNESIUM  GLYCINATE PO Take 1 tablet by mouth daily.   pregabalin  (LYRICA ) 100 MG capsule Take 100 mg by mouth 2 (two) times daily.   sertraline (ZOLOFT) 50 MG tablet Take 50 mg by mouth daily.   traZODone  (DESYREL ) 50 MG tablet Take 50-100 mg by mouth at bedtime as needed.   [DISCONTINUED] estradiol  (ESTRACE ) 0.5 MG tablet TAKE 1 TABLET BY MOUTH EVERY DAY   [DISCONTINUED] venlafaxine  XR (EFFEXOR -XR) 150 MG 24 hr capsule Take 150 mg by mouth every morning.   No facility-administered encounter medications on file as of 09/29/2023.     Lab Results  Component Value Date   WBC 6.5 10/25/2022   HGB 13.1 10/25/2022   HCT 40.9 10/25/2022   PLT 339.0 10/25/2022   GLUCOSE 98 03/01/2023   CHOL 237 (H) 10/25/2022   TRIG 82.0 10/25/2022   HDL 90.20 10/25/2022   LDLDIRECT 132.6 01/29/2013   LDLCALC 130 (H) 10/25/2022   ALT 21 03/01/2023   AST 22 03/01/2023   NA 139 03/01/2023   K 3.7 03/01/2023   CL 103 03/01/2023   CREATININE 0.78 03/01/2023   BUN 14 03/01/2023   CO2 29 03/01/2023   TSH 1.33 03/01/2023   INR 0.9 10/27/2020       Assessment & Plan:  Hypertension, essential Assessment & Plan: Blood pressure improved. Continue amloipdine 5mg  q day. Follow pressures. Follow metabolic panel.    GAD (generalized anxiety disorder) Assessment & Plan: Being followed by Dr Chipper. On effexor  and zoloft. Zoloft now 100mg  q day and effexor  -  75mg  - doses adjusted. Feels this regimen is helping.  Follow.    Nonintractable headache, unspecified chronicity pattern, unspecified headache type Assessment & Plan: Seeing neurology. Receiving botox injections. Do feel they are helping. Follow.    Hypercholesterolemia Assessment &  Plan: The 10-year ASCVD risk score (Arnett DK, et al., 2019) is: 0.6%   Values used to calculate the score:     Age: 19 years     Clincally relevant sex: Female     Is Non-Hispanic African American: No     Diabetic: No     Tobacco smoker: No     Systolic Blood Pressure: 108 mmHg     Is BP treated: Yes     HDL Cholesterol: 90.2 mg/dL     Total Cholesterol: 237 mg/dL  Low cholesterol diet and exercise. Follow lipid panel.    Pain of foot, unspecified laterality Assessment & Plan: Pain - bunions. Request referral to podiatry.   Orders: -     Ambulatory referral to Podiatry  Other orders -     Estradiol ; Take 1 tablet (0.5 mg total) by mouth daily.  Dispense: 90 tablet; Refill: 1     Allena Hamilton, MD

## 2023-10-02 ENCOUNTER — Encounter: Payer: Self-pay | Admitting: Internal Medicine

## 2023-10-02 DIAGNOSIS — M79673 Pain in unspecified foot: Secondary | ICD-10-CM | POA: Insufficient documentation

## 2023-10-02 MED ORDER — ESTRADIOL 0.5 MG PO TABS: 0.5000 mg | ORAL_TABLET | Freq: Every day | ORAL | 1 refills | Status: DC

## 2023-10-02 NOTE — Assessment & Plan Note (Signed)
 Pain - bunions. Request referral to podiatry.

## 2023-10-02 NOTE — Assessment & Plan Note (Signed)
 Seeing neurology. Receiving botox injections. Do feel they are helping. Follow.

## 2023-10-02 NOTE — Assessment & Plan Note (Signed)
 Being followed by Dr Chipper. On effexor  and zoloft. Zoloft now 100mg  q day and effexor  - 75mg  - doses adjusted. Feels this regimen is helping.  Follow.

## 2023-10-02 NOTE — Assessment & Plan Note (Signed)
 Blood pressure improved. Continue amloipdine 5mg  q day. Follow pressures. Follow metabolic panel.

## 2023-10-02 NOTE — Assessment & Plan Note (Signed)
 The 10-year ASCVD risk score (Arnett DK, et al., 2019) is: 0.6%   Values used to calculate the score:     Age: 48 years     Clincally relevant sex: Female     Is Non-Hispanic African American: No     Diabetic: No     Tobacco smoker: No     Systolic Blood Pressure: 108 mmHg     Is BP treated: Yes     HDL Cholesterol: 90.2 mg/dL     Total Cholesterol: 237 mg/dL  Low cholesterol diet and exercise. Follow lipid panel.

## 2023-10-18 ENCOUNTER — Ambulatory Visit: Payer: Self-pay | Admitting: Podiatry

## 2023-10-18 DIAGNOSIS — M21619 Bunion of unspecified foot: Secondary | ICD-10-CM

## 2023-10-18 DIAGNOSIS — M21962 Unspecified acquired deformity of left lower leg: Secondary | ICD-10-CM | POA: Diagnosis not present

## 2023-10-18 DIAGNOSIS — M21961 Unspecified acquired deformity of right lower leg: Secondary | ICD-10-CM

## 2023-10-18 NOTE — Progress Notes (Signed)
 Subjective:  Patient ID: Ariel Morgan, female    DOB: [birth year],  MRN: [id number]  Chief Complaint  Patient presents with   Foot Pain    48 y.o. female presents with the above complaint.  Patient presents with bilateral moderate bunion deformity she states has been present for quite some time is progressed gotten worse worse with ambulation or shoe pressure she wanted to get it evaluated pain scale \\4  out of 10 dull aching nature.  Wanted to discuss treatment options for bunion deformity she does not wear any orthotics.  She Review of Systems: Negative except as noted in the HPI. Denies N/V/F/Ch.  Past Medical History:  Diagnosis Date   Anemia    Anxiety    Arthritis    Chronic low back pain    Chronic migraine without aura    Depression    Frequent headaches    H/O   GERD (gastroesophageal reflux disease)    Herpes zoster    History of kidney stones    Hypercholesterolemia    Placenta previa    Right ureteral stone 12/2022    Current Outpatient Medications:    acyclovir  (ZOVIRAX ) 400 MG tablet, TAKE ONE TABLET 3 TIMES A DAY FOR 5 DAYS AS NEEDED, Disp: 270 tablet, Rfl: 0   amLODipine  (NORVASC ) 5 MG tablet, TAKE 1 TABLET (5 MG TOTAL) BY MOUTH DAILY., Disp: 90 tablet, Rfl: 1   amphetamine-dextroamphetamine (ADDERALL XR) 30 MG 24 hr capsule, Take 30 mg by mouth daily., Disp: , Rfl:    amphetamine-dextroamphetamine (ADDERALL) 10 MG tablet, Take 10 mg by mouth daily as needed (focus)., Disp: , Rfl:    EMGALITY 120 MG/ML SOAJ, Inject 1 mL into the skin every 30 (thirty) days., Disp: , Rfl:    estradiol  (ESTRACE ) 0.5 MG tablet, Take 1 tablet (0.5 mg total) by mouth daily., Disp: 90 tablet, Rfl: 1   fexofenadine (ALLEGRA) 60 MG tablet, Take 60 mg by mouth 2 (two) times daily. , Disp: , Rfl:    MAGNESIUM  GLYCINATE PO, Take 1 tablet by mouth daily., Disp: , Rfl:    NURTEC 75 MG TBDP, Take 1 tablet by mouth daily as needed., Disp: , Rfl:    pregabalin  (LYRICA ) 100 MG capsule,  Take 100 mg by mouth 2 (two) times daily., Disp: , Rfl:    sertraline (ZOLOFT) 100 MG tablet, Take 100 mg by mouth daily., Disp: , Rfl:    sertraline (ZOLOFT) 50 MG tablet, Take 50 mg by mouth daily., Disp: , Rfl:    traZODone  (DESYREL ) 50 MG tablet, Take 50-100 mg by mouth at bedtime as needed., Disp: , Rfl:    venlafaxine  XR (EFFEXOR -XR) 75 MG 24 hr capsule, Take 75 mg by mouth daily., Disp: , Rfl:   Social History   Tobacco Use  Smoking Status Former   Current packs/day: 1.00   Types: Cigarettes  Smokeless Tobacco Never    No Known Allergies Objective:  There were no vitals filed for this visit. There is no height or weight on file to calculate BMI. Constitutional Well developed. Well nourished.  Vascular Dorsalis pedis pulses palpable bilaterally. Posterior tibial pulses palpable bilaterally. Capillary refill normal to all digits.  No cyanosis or clubbing noted. Pedal hair growth normal.  Neurologic Normal speech. Oriented to person, place, and time. Epicritic sensation to light touch grossly present bilaterally.  Dermatologic Nails well groomed and normal in appearance. No open wounds. No skin lesions.  Orthopedic: Normal joint ROM without pain or crepitus bilaterally. Hallux abductovalgus deformity  present bilateral moderate bunion deformity this is track bound not a tracking deformity. Left 1st MPJ diminished range of motion. Left 1st TMT without gross hypermobility. Right 1st MPJ diminished range of motion  Right 1st TMT without gross hypermobility. Lesser digital contractures absent bilaterally.   Radiographs: None  Assessment:  No diagnosis found. Plan:  Patient was evaluated and treated and all questions answered.  Hallux abductovalgus deformity, bilateral - All questions and concerns were discussed with the patient in extensive detail.  I did briefly discussed all the procedures that were available to correct the bunion deformity for now she will think  about the procedure will get back to me.  I discussed shoe gear modification as well as orthotics management as well.  She states understanding and would like to obtain orthotics  Pes planovalgus/foot deformity -I explained to patient the etiology of pes planovalgus and relationship with Planter fasciitis and various treatment options were discussed.  Given patient foot structure in the setting of Planter fasciitis I believe patient will benefit from custom-made orthotics to help control the hindfoot motion support the arch of the foot and take the stress away from plantar fascial.  Patient agrees with the plan like to proceed with orthotics -Patient was casted for orthotics   No follow-ups on file.   Bilateral pes planovalgus orthotics  Bilateral bunion deformity will think about procedure moderate

## 2023-11-18 ENCOUNTER — Telehealth: Payer: Self-pay | Admitting: Podiatry

## 2023-11-18 NOTE — Telephone Encounter (Signed)
 LVM to schedule orthotic fitting/ pu  Balance for orthotics: $0  Orthotics in [facility] bin

## 2023-12-17 ENCOUNTER — Other Ambulatory Visit: Payer: Self-pay | Admitting: Internal Medicine

## 2023-12-20 ENCOUNTER — Telehealth

## 2023-12-20 ENCOUNTER — Ambulatory Visit
Admission: EM | Admit: 2023-12-20 | Discharge: 2023-12-20 | Disposition: A | Discharge status: Home / Self Care | Attending: Emergency Medicine | Admitting: Emergency Medicine

## 2023-12-20 DIAGNOSIS — J069 Acute upper respiratory infection, unspecified: Secondary | ICD-10-CM | POA: Diagnosis not present

## 2023-12-20 DIAGNOSIS — J040 Acute laryngitis: Secondary | ICD-10-CM

## 2023-12-20 LAB — POCT RAPID STREP A (OFFICE): Rapid Strep A Screen: NEGATIVE

## 2023-12-20 NOTE — ED Provider Notes (Signed)
 CAY RALPH PELT    CSN: [id number] Arrival date & time: 12/20/23  1358      History   Chief Complaint Chief Complaint  Patient presents with   Cough   Nasal Congestion   Hoarse    HPI Ariel Morgan is a 48 y.o. female.  Patient presents with 4-day history of of nasal congestion, nasal drip, sore throat, hoarse voice, and cough.  No fever or shortness of breath.  No OTC medications taken.  The history is provided by the patient and medical records.    Past Medical History:  Diagnosis Date   Anemia    Anxiety    Arthritis    Chronic low back pain    Chronic migraine without aura    Depression    Frequent headaches    H/O   GERD (gastroesophageal reflux disease)    Herpes zoster    History of kidney stones    Hypercholesterolemia    Placenta previa    Right ureteral stone 12/2022    Patient Active Problem List   Diagnosis Date Noted   Foot pain 10/02/2023   Vaginal irritation 06/05/2023   Dysuria 06/02/2023   Hypertension, essential 05/22/2023   Right ureteral stone 12/17/2022   Kidney stone 10/29/2022   ETD (Eustachian tube dysfunction), left 07/16/2022   Colon cancer screening 05/01/2022   Herpes labialis 05/01/2022   Hot flashes 01/28/2022   Dizziness 04/24/2021   Lightheadedness 04/17/2021   Chronic pain 10/30/2020   Breast cancer screening 10/24/2020   Cough 06/22/2020   Hair loss 06/22/2020   S/P laparoscopic hysterectomy 09/19/2019   Fibroid 05/28/2019   RLQ abdominal pain 05/28/2019   Leg pain 02/25/2019   Weight loss 11/19/2018   Surgery, elective    Hyperlipidemia    Anxiety state    Acute blood loss anemia    Post-operative pain    S/P lumbar fusion 12/28/2017   Pre-op evaluation 12/11/2017   Abnormal mammogram 07/23/2017   Microcalcification of right breast on mammogram 05/04/2017   Low back pain 06/17/2015   Health care maintenance 03/15/2015   Herpes zoster 09/16/2014   GAD (generalized anxiety disorder) 12/03/2012    GERD (gastroesophageal reflux disease) 12/03/2012   Headache 12/03/2012   Hypercholesterolemia 12/03/2012    Past Surgical History:  Procedure Laterality Date   ABDOMINAL EXPOSURE N/A 01/01/2018   Procedure: ABDOMINAL EXPOSURE;  Surgeon: Gretta Lonni PARAS, MD;  Location: [facility] OR;  Service: Vascular;  Laterality: N/A;   ANTERIOR LUMBAR FUSION N/A 01/01/2018   Procedure: ANTERIOR LUMBAR FUSION L5-S1; REPLACEMENT OF posterior HARDWARE L4-S1;  Surgeon: Burnetta Aures, MD;  Location: [facility] OR;  Service: Orthopedics;  Laterality: N/A;   BREAST BIOPSY Right 05/10/2017   right breast stereotatic bx pash   CESAREAN SECTION  2003 & 2010   CHOLECYSTECTOMY  2005   CYSTOSCOPY  09/11/2019   Procedure: CYSTOSCOPY;  Surgeon: Arloa Lamar SQUIBB, MD;  Location: [facility] ORS;  Service: Gynecology;;   CYSTOSCOPY/URETEROSCOPY/HOLMIUM LASER/STENT PLACEMENT Right 12/17/2022   Procedure: CYSTOSCOPY/URETEROSCOPY/HOLMIUM LASER/STENT PLACEMENT;  Surgeon: Francisca Redell BROCKS, MD;  Location: [facility] ORS;  Service: Urology;  Laterality: Right;   LUMBAR LAMINECTOMY/DECOMPRESSION MICRODISCECTOMY N/A 12/28/2017   Procedure: L4-S1 decompression and fusion;  Surgeon: Burnetta Aures, MD;  Location: [facility] OR;  Service: Orthopedics;  Laterality: N/A;  4.5 hrs   SPINAL CORD STIMULATOR INSERTION N/A 10/30/2020   Procedure: SPINAL CORD STIMULATOR INSERTION;  Surgeon: Burnetta Aures, MD;  Location: [facility] OR;  Service: Orthopedics;  Laterality: N/A;   TOTAL LAPAROSCOPIC  HYSTERECTOMY WITH SALPINGECTOMY Bilateral 09/11/2019   Procedure: TOTAL LAPAROSCOPIC HYSTERECTOMY WITH SALPINGECTOMY;  Surgeon: Arloa Lamar SQUIBB, MD;  Location: [facility] ORS;  Service: Gynecology;  Laterality: Bilateral;    OB History     Gravida  3   Para  3   Term  1   Preterm  2   AB      Living  3      SAB      IAB      Ectopic      Multiple      Live Births  3        Obstetric Comments  1st Menstrual Cycle:  9 1st Pregnancy: 26           Home  Medications    Prior to Admission medications   Medication Sig Start Date End Date Taking? Authorizing Provider  acyclovir  (ZOVIRAX ) 400 MG tablet TAKE ONE TABLET 3 TIMES A DAY FOR 5 DAYS AS NEEDED 12/19/23   Glendia Shad, MD  amLODipine  (NORVASC ) 5 MG tablet TAKE 1 TABLET (5 MG TOTAL) BY MOUTH DAILY. 09/02/23   Glendia Shad, MD  amphetamine-dextroamphetamine (ADDERALL XR) 30 MG 24 hr capsule Take 30 mg by mouth daily. 05/24/20   [provider]  amphetamine-dextroamphetamine (ADDERALL) 10 MG tablet Take 10 mg by mouth daily as needed (focus). 10/25/20   [provider]  EMGALITY 120 MG/ML SOAJ Inject 1 mL into the skin every 30 (thirty) days. 04/20/21   [provider]  estradiol  (ESTRACE ) 0.5 MG tablet Take 1 tablet (0.5 mg total) by mouth daily. 10/02/23   Glendia Shad, MD  fexofenadine (ALLEGRA) 60 MG tablet Take 60 mg by mouth 2 (two) times daily.     [provider]  MAGNESIUM  GLYCINATE PO Take 1 tablet by mouth daily.    [provider]  NURTEC 75 MG TBDP Take 1 tablet by mouth daily as needed. 09/16/23   [provider]  pregabalin  (LYRICA ) 100 MG capsule Take 100 mg by mouth 2 (two) times daily.    [provider]  sertraline (ZOLOFT) 100 MG tablet Take 100 mg by mouth daily. 08/19/23   [provider]  sertraline (ZOLOFT) 50 MG tablet Take 50 mg by mouth daily.    [provider]  traZODone  (DESYREL ) 50 MG tablet Take 50-100 mg by mouth at bedtime as needed. 04/12/22   [provider]  venlafaxine  XR (EFFEXOR -XR) 75 MG 24 hr capsule Take 75 mg by mouth daily. 09/15/23   [provider]    Family History Family History  Problem Relation Age of Onset   Hyperlipidemia Father    Diabetes Sister    Diabetes Maternal Grandmother    Cancer Maternal Grandmother        vaginal   Cirrhosis Maternal Grandmother    Colon polyps Maternal Grandmother    Breast cancer Maternal Grandmother         Early 50's    Social History Social History   Tobacco Use   Smoking status: Former    Current packs/day: 1.00    Types: Cigarettes   Smokeless tobacco: Never  Vaping Use   Vaping status: Never Used  Substance Use Topics   Alcohol use: Not Currently    Comment: rarely   Drug use: No     Allergies   Patient has no known allergies.   Review of Systems Review of Systems  Constitutional:  Negative for chills and fever.  HENT:  Positive for congestion,  postnasal drip, sore throat and voice change. Negative for ear pain.   Respiratory:  Positive for cough. Negative for shortness of breath.      Physical Exam Triage Vital Signs ED Triage Vitals [12/20/23 1454]  Encounter Vitals Group     BP 109/77     Girls Systolic BP Percentile      Girls Diastolic BP Percentile      Boys Systolic BP Percentile      Boys Diastolic BP Percentile      Pulse Rate 86     Resp 20     Temp 98.2 F (36.8 C)     Temp src      SpO2 98 %     Weight      Height      Head Circumference      Peak Flow      Pain Score 1     Pain Loc      Pain Education      Exclude from Growth Chart    No data found.  Updated Vital Signs BP 109/77   Pulse 86   Temp 98.2 F (36.8 C)   Resp 20   SpO2 98%   Visual Acuity Right Eye Distance:   Left Eye Distance:   Bilateral Distance:    Right Eye Near:   Left Eye Near:    Bilateral Near:     Physical Exam Constitutional:      General: She is not in acute distress. HENT:     Right Ear: Tympanic membrane normal.     Left Ear: Tympanic membrane normal.     Nose: Nose normal.     Mouth/Throat:     Mouth: Mucous membranes are moist.     Pharynx: Posterior oropharyngeal erythema present.     Comments: PND. Cardiovascular:     Rate and Rhythm: Normal rate and regular rhythm.     Heart sounds: Normal heart sounds.  Pulmonary:     Effort: Pulmonary effort is normal. No respiratory distress.     Breath sounds: Normal breath sounds.   Neurological:     Mental Status: She is alert.      UC Treatments / Results  Labs (all labs ordered are listed, but only abnormal results are displayed) Labs Reviewed  POCT RAPID STREP A (OFFICE) - Normal    EKG   Radiology No results found.  Procedures Procedures (including critical care time)  Medications Ordered in UC Medications - No data to display  Initial Impression / Assessment and Plan / UC Course  I have reviewed the triage vital signs and the nursing notes.  Pertinent labs & imaging results that were available during my care of the patient were reviewed by me and considered in my medical decision making (see chart for details).    Viral URI, laryngitis.  Afebrile and vital signs are stable.  Lungs are clear and O2 sat is 98% on room air.  Rapid strep test is negative.  Patient declines COVID test here today as she had a negative test at home.  Discussed symptomatic treatment including Tylenol  or ibuprofen as needed.  Education provided on laryngitis and viral URI.  Instructed her to follow-up with her PCP if she is not improving.  She agrees to plan of care.  Final Clinical Impressions(s) / UC Diagnoses   Final diagnoses:  Viral URI  Laryngitis     Discharge Instructions      Your strep test is negative.  Take Tylenol  or ibuprofen as needed for fever or discomfort.  Take plain Mucinex as needed for congestion.  Rest and keep yourself hydrated.    Follow-up with your primary care provider if your symptoms are not improving.         ED Prescriptions   None    PDMP not reviewed this encounter.   Corlis Burnard DEL, NP 12/20/23 [phone]

## 2023-12-20 NOTE — ED Triage Notes (Signed)
 Patient to Urgent Care with complaints of cough/ nasal congestion/ hoarseness and painful to talk. Productive cough.  Symptoms started Friday.   No otc meds.

## 2023-12-20 NOTE — Discharge Instructions (Addendum)
Your strep test is negative.    Take Tylenol or ibuprofen as needed for fever or discomfort.  Take plain Mucinex as needed for congestion.  Rest and keep yourself hydrated.    Follow-up with your primary care provider if your symptoms are not improving.

## 2023-12-30 ENCOUNTER — Ambulatory Visit: Admitting: Internal Medicine

## 2023-12-30 ENCOUNTER — Encounter: Payer: Self-pay | Admitting: Internal Medicine

## 2023-12-30 VITALS — BP 120/70 | HR 101 | Temp 97.8°F | Ht 62.0 in | Wt 179.0 lb

## 2023-12-30 DIAGNOSIS — R519 Headache, unspecified: Secondary | ICD-10-CM | POA: Diagnosis not present

## 2023-12-30 DIAGNOSIS — F411 Generalized anxiety disorder: Secondary | ICD-10-CM

## 2023-12-30 DIAGNOSIS — E785 Hyperlipidemia, unspecified: Secondary | ICD-10-CM | POA: Diagnosis not present

## 2023-12-30 DIAGNOSIS — M79673 Pain in unspecified foot: Secondary | ICD-10-CM

## 2023-12-30 DIAGNOSIS — R059 Cough, unspecified: Secondary | ICD-10-CM

## 2023-12-30 DIAGNOSIS — I10 Essential (primary) hypertension: Secondary | ICD-10-CM | POA: Diagnosis not present

## 2023-12-30 MED ORDER — AMLODIPINE BESYLATE 5 MG PO TABS: 5.0000 mg | ORAL_TABLET | Freq: Every day | ORAL | 1 refills | Status: AC

## 2023-12-30 NOTE — Progress Notes (Signed)
 Subjective:    Patient ID: Ariel Morgan, female    DOB: 22-Feb-1976, 48 y.o.   MRN: [id number]  Patient here for  Chief Complaint  Patient presents with   Medical Management of Chronic Issues    HPI Here for a scheduled follow up - follow up regarding increased stress and hypertension.  Saw neurology 05/25/23 - f/u headaches - felt to be migraine. Last visit 10/17/23 - s/p botox injection. Recommendd to continue emgality. She does feel botox injections help. Saw Dr Maree 11/22/23 - chronic L5 radiculopathy with neuropathy - managed with lyrica . Being followed by Dr Chipper. Continues on effexor  and zoloft. Saw podiatry 10/18/23 - pes planovalgus/foot deformity- orthotics. Was seen UC 12/20/23 - viral URI. States symptoms started a few weeks ago - cough. Last week - lost voice. Increased cough - treated - abx and delsym. Overall has improved. Still with some cough, but not productive now. No sore throat now. No vomiting. Diarrhea two weeks ago. Some drainage. No sob.    Past Medical History:  Diagnosis Date   Anemia    Anxiety    Arthritis    Chronic low back pain    Chronic migraine without aura    Depression    Frequent headaches    H/O   GERD (gastroesophageal reflux disease)    Herpes zoster    History of kidney stones    Hypercholesterolemia    Placenta previa    Right ureteral stone 12/2022   Past Surgical History:  Procedure Laterality Date   ABDOMINAL EXPOSURE N/A 01/01/2018   Procedure: ABDOMINAL EXPOSURE;  Surgeon: Gretta Lonni PARAS, MD;  Location: [facility] OR;  Service: Vascular;  Laterality: N/A;   ANTERIOR LUMBAR FUSION N/A 01/01/2018   Procedure: ANTERIOR LUMBAR FUSION L5-S1; REPLACEMENT OF posterior HARDWARE L4-S1;  Surgeon: Burnetta Aures, MD;  Location: [facility] OR;  Service: Orthopedics;  Laterality: N/A;   BREAST BIOPSY Right 05/10/2017   right breast stereotatic bx pash   CESAREAN SECTION  2003 & 2010   CHOLECYSTECTOMY  2005   CYSTOSCOPY  09/11/2019   Procedure:  CYSTOSCOPY;  Surgeon: Arloa Lamar SQUIBB, MD;  Location: [facility] ORS;  Service: Gynecology;;   CYSTOSCOPY/URETEROSCOPY/HOLMIUM LASER/STENT PLACEMENT Right 12/17/2022   Procedure: CYSTOSCOPY/URETEROSCOPY/HOLMIUM LASER/STENT PLACEMENT;  Surgeon: Francisca Redell BROCKS, MD;  Location: [facility] ORS;  Service: Urology;  Laterality: Right;   LUMBAR LAMINECTOMY/DECOMPRESSION MICRODISCECTOMY N/A 12/28/2017   Procedure: L4-S1 decompression and fusion;  Surgeon: Burnetta Aures, MD;  Location: [facility] OR;  Service: Orthopedics;  Laterality: N/A;  4.5 hrs   SPINAL CORD STIMULATOR INSERTION N/A 10/30/2020   Procedure: SPINAL CORD STIMULATOR INSERTION;  Surgeon: Burnetta Aures, MD;  Location: [facility] OR;  Service: Orthopedics;  Laterality: N/A;   TOTAL LAPAROSCOPIC HYSTERECTOMY WITH SALPINGECTOMY Bilateral 09/11/2019   Procedure: TOTAL LAPAROSCOPIC HYSTERECTOMY WITH SALPINGECTOMY;  Surgeon: Arloa Lamar SQUIBB, MD;  Location: [facility] ORS;  Service: Gynecology;  Laterality: Bilateral;   TUBAL LIGATION  2010   Family History  Problem Relation Age of Onset   Hyperlipidemia Father    Diabetes Sister    Diabetes Maternal Grandmother    Cancer Maternal Grandmother        vaginal   Cirrhosis Maternal Grandmother    Colon polyps Maternal Grandmother    Breast cancer Maternal Grandmother        Early 70's   Hearing loss Maternal Grandfather    Diabetes Sister    Social History   Socioeconomic History   Marital status: Married    Spouse name:  Jayson   Number of children: 4   Years of education: Not on file   Highest education level: Master's degree (e.g., MA, MS, MEng, MEd, MSW, MBA)  Occupational History   Not on file  Tobacco Use   Smoking status: Former    Current packs/day: 1.00    Types: Cigarettes   Smokeless tobacco: Never  Vaping Use   Vaping status: Never Used  Substance and Sexual Activity   Alcohol use: Not Currently    Comment: rarely   Drug use: No   Sexual activity: Not on file  Other Topics Concern   Not on  file  Social History Narrative   Not on file   Social Drivers of Health   Financial Resource Strain: Low Risk  (09/29/2023)   Overall Financial Resource Strain (CARDIA)    Difficulty of Paying Living Expenses: Not hard at all  Food Insecurity: No Food Insecurity (09/29/2023)   Hunger Vital Sign    Worried About Running Out of Food in the Last Year: Never true    Ran Out of Food in the Last Year: Never true  Transportation Needs: No Transportation Needs (09/29/2023)   PRAPARE - Administrator, Civil Service (Medical): No    Lack of Transportation (Non-Medical): No  Physical Activity: Insufficiently Active (09/29/2023)   Exercise Vital Sign    Days of Exercise per Week: 3 days    Minutes of Exercise per Session: 20 min  Stress: Stress Concern Present (09/29/2023)   [employer] of Occupational Health - Occupational Stress Questionnaire    Feeling of Stress: To some extent  Social Connections: Socially Integrated (09/29/2023)   Social Connection and Isolation Panel    Frequency of Communication with Friends and Family: More than three times a week    Frequency of Social Gatherings with Friends and Family: Once a week    Attends Religious Services: More than 4 times per year    Active Member of [employer] or Organizations: Yes    Attends Banker Meetings: Patient declined    Marital Status: Married     Review of Systems  Constitutional:  Negative for appetite change and fever.  HENT:         Previous sore throat. Better now. Some drainage.   Respiratory:  Positive for cough. Negative for chest tightness and shortness of breath.   Cardiovascular:  Negative for chest pain, palpitations and leg swelling.  Gastrointestinal:  Negative for abdominal pain, nausea and vomiting.  Genitourinary:  Negative for difficulty urinating and dysuria.  Musculoskeletal:  Negative for joint swelling and myalgias.  Skin:  Negative for color change and rash.  Neurological:   Negative for dizziness and headaches.  Psychiatric/Behavioral:  Negative for agitation and dysphoric mood.        Objective:     BP 120/70   Pulse (!) 101   Temp 97.8 F (36.6 C) (Oral)   Ht 5' 2 (1.575 m)   Wt 179 lb (81.2 kg)   SpO2 97%   BMI 32.74 kg/m  Wt Readings from Last 3 Encounters:  12/30/23 179 lb (81.2 kg)  09/29/23 187 lb (84.8 kg)  06/02/23 189 lb 6.4 oz (85.9 kg)    Physical Exam Vitals reviewed.  Constitutional:      General: She is not in acute distress.    Appearance: Normal appearance.  HENT:     Head: Normocephalic and atraumatic.     Right Ear: External ear normal.     Left  Ear: External ear normal.     Mouth/Throat:     Pharynx: No oropharyngeal exudate or posterior oropharyngeal erythema.  Eyes:     General: No scleral icterus.       Right eye: No discharge.        Left eye: No discharge.     Conjunctiva/sclera: Conjunctivae normal.  Neck:     Thyroid : No thyromegaly.  Cardiovascular:     Rate and Rhythm: Normal rate and regular rhythm.  Pulmonary:     Effort: No respiratory distress.     Breath sounds: Normal breath sounds. No wheezing.     Comments: No increased cough with forced expiration.  Abdominal:     General: Bowel sounds are normal.     Palpations: Abdomen is soft.     Tenderness: There is no abdominal tenderness.  Musculoskeletal:        General: No swelling or tenderness.     Cervical back: Neck supple. No tenderness.  Lymphadenopathy:     Cervical: No cervical adenopathy.  Skin:    Findings: No erythema or rash.  Neurological:     Mental Status: She is alert.  Psychiatric:        Mood and Affect: Mood normal.        Behavior: Behavior normal.         Outpatient Encounter Medications as of 12/30/2023  Medication Sig   acyclovir  (ZOVIRAX ) 400 MG tablet TAKE ONE TABLET 3 TIMES A DAY FOR 5 DAYS AS NEEDED   amLODipine  (NORVASC ) 5 MG tablet Take 1 tablet (5 mg total) by mouth daily.   amphetamine-dextroamphetamine  (ADDERALL XR) 30 MG 24 hr capsule Take 30 mg by mouth daily.   amphetamine-dextroamphetamine (ADDERALL) 10 MG tablet Take 10 mg by mouth daily as needed (focus).   EMGALITY 120 MG/ML SOAJ Inject 1 mL into the skin every 30 (thirty) days.   estradiol  (ESTRACE ) 0.5 MG tablet Take 1 tablet (0.5 mg total) by mouth daily.   fexofenadine (ALLEGRA) 60 MG tablet Take 60 mg by mouth 2 (two) times daily.    MAGNESIUM  GLYCINATE PO Take 1 tablet by mouth daily.   NURTEC 75 MG TBDP Take 1 tablet by mouth daily as needed.   pregabalin  (LYRICA ) 100 MG capsule Take 100 mg by mouth 2 (two) times daily.   sertraline (ZOLOFT) 100 MG tablet Take 100 mg by mouth daily.   sertraline (ZOLOFT) 50 MG tablet Take 50 mg by mouth daily.   traZODone  (DESYREL ) 50 MG tablet Take 50-100 mg by mouth at bedtime as needed.   venlafaxine  XR (EFFEXOR -XR) 75 MG 24 hr capsule Take 75 mg by mouth daily.   [DISCONTINUED] amLODipine  (NORVASC ) 5 MG tablet TAKE 1 TABLET (5 MG TOTAL) BY MOUTH DAILY.   No facility-administered encounter medications on file as of 12/30/2023.     Lab Results  Component Value Date   WBC 6.5 10/25/2022   HGB 13.1 10/25/2022   HCT 40.9 10/25/2022   PLT 339.0 10/25/2022   GLUCOSE 98 03/01/2023   CHOL 237 (H) 10/25/2022   TRIG 82.0 10/25/2022   HDL 90.20 10/25/2022   LDLDIRECT 132.6 01/29/2013   LDLCALC 130 (H) 10/25/2022   ALT 21 03/01/2023   AST 22 03/01/2023   NA 139 03/01/2023   K 3.7 03/01/2023   CL 103 03/01/2023   CREATININE 0.78 03/01/2023   BUN 14 03/01/2023   CO2 29 03/01/2023   TSH 1.33 03/01/2023   INR 0.9 10/27/2020  Assessment & Plan:  Hypertension, essential Assessment & Plan: Blood pressure as outlined. Continues on amlodipine  5mg  q day. No change in medication today. Follow pressures. Follow metabolic panel.    Hyperlipidemia, unspecified hyperlipidemia type Assessment & Plan: Low cholesterol diet and exercise. Follow lipid panel.    Nonintractable headache,  unspecified chronicity pattern, unspecified headache type Assessment & Plan: Saw neurology 05/25/23 - f/u headaches - felt to be migraine. Last visit 10/17/23 - s/p botox injection. Recommendd to continue emgality. She does feel botox injections help.    GAD (generalized anxiety disorder) Assessment & Plan: Being followed by Dr Chipper. Continues on effexor  and zoloft.    Pain of foot, unspecified laterality Assessment & Plan: Saw podiatry 10/18/23 - pes planovalgus/foot deformity- orthotics.    Cough, unspecified type Assessment & Plan: Recently diagnosed and treated URI. Flonase to help with drainage. Cough has improved. Robitussin DM/delsym prn cough congestion. Follow. Call with update.    Other orders -     amLODIPine  Besylate; Take 1 tablet (5 mg total) by mouth daily.  Dispense: 90 tablet; Refill: 1     Allena Hamilton, MD

## 2024-01-02 ENCOUNTER — Encounter: Payer: Self-pay | Admitting: Internal Medicine

## 2024-01-02 NOTE — Assessment & Plan Note (Signed)
 Blood pressure as outlined. Continues on amlodipine  5mg  q day. No change in medication today. Follow pressures. Follow metabolic panel.

## 2024-01-02 NOTE — Assessment & Plan Note (Signed)
 Being followed by Dr Chipper. Continues on effexor  and zoloft.

## 2024-01-02 NOTE — Assessment & Plan Note (Signed)
 Low cholesterol diet and exercise.  Follow lipid panel.

## 2024-01-02 NOTE — Assessment & Plan Note (Signed)
 Saw neurology 05/25/23 - f/u headaches - felt to be migraine. Last visit 10/17/23 - s/p botox injection. Recommendd to continue emgality. She does feel botox injections help.

## 2024-01-02 NOTE — Assessment & Plan Note (Signed)
 Saw podiatry 10/18/23 - pes planovalgus/foot deformity- orthotics.

## 2024-01-02 NOTE — Assessment & Plan Note (Signed)
 Recently diagnosed and treated URI. Flonase to help with drainage. Cough has improved. Robitussin DM/delsym prn cough congestion. Follow. Call with update.

## 2024-03-11 ENCOUNTER — Other Ambulatory Visit: Payer: Self-pay | Admitting: Internal Medicine

## 2024-04-11 ENCOUNTER — Other Ambulatory Visit (INDEPENDENT_AMBULATORY_CARE_PROVIDER_SITE_OTHER)

## 2024-04-11 ENCOUNTER — Other Ambulatory Visit: Payer: Self-pay | Admitting: Internal Medicine

## 2024-04-11 DIAGNOSIS — E7800 Pure hypercholesterolemia, unspecified: Secondary | ICD-10-CM | POA: Diagnosis not present

## 2024-04-12 ENCOUNTER — Ambulatory Visit: Payer: Self-pay | Admitting: Internal Medicine

## 2024-04-12 LAB — LIPID PANEL
Cholesterol: 264 mg/dL — ABNORMAL HIGH (ref 28–200)
HDL: 85.5 mg/dL
LDL Cholesterol: 143 mg/dL — ABNORMAL HIGH (ref 10–99)
NonHDL: 178.43
Total CHOL/HDL Ratio: 3
Triglycerides: 178 mg/dL — ABNORMAL HIGH (ref 10.0–149.0)
VLDL: 35.6 mg/dL (ref 0.0–40.0)

## 2024-04-12 LAB — TSH: TSH: 1.65 u[IU]/mL (ref 0.35–5.50)

## 2024-04-12 LAB — COMPREHENSIVE METABOLIC PANEL WITH GFR
ALT: 22 U/L (ref 3–35)
AST: 19 U/L (ref 5–37)
Albumin: 4.4 g/dL (ref 3.5–5.2)
Alkaline Phosphatase: 75 U/L (ref 39–117)
BUN: 12 mg/dL (ref 6–23)
CO2: 31 meq/L (ref 19–32)
Calcium: 9.3 mg/dL (ref 8.4–10.5)
Chloride: 103 meq/L (ref 96–112)
Creatinine, Ser: 0.74 mg/dL (ref 0.40–1.20)
GFR: 95.8 mL/min
Glucose, Bld: 100 mg/dL — ABNORMAL HIGH (ref 70–99)
Potassium: 3.6 meq/L (ref 3.5–5.1)
Sodium: 141 meq/L (ref 135–145)
Total Bilirubin: 0.4 mg/dL (ref 0.2–1.2)
Total Protein: 6.7 g/dL (ref 6.0–8.3)

## 2024-04-12 LAB — CBC WITH DIFFERENTIAL/PLATELET
Basophils Absolute: 0.2 K/uL — ABNORMAL HIGH (ref 0.0–0.1)
Basophils Relative: 1.9 % (ref 0.0–3.0)
Eosinophils Absolute: 0.2 K/uL (ref 0.0–0.7)
Eosinophils Relative: 2.2 % (ref 0.0–5.0)
HCT: 37.6 % (ref 36.0–46.0)
Hemoglobin: 12.8 g/dL (ref 12.0–15.0)
Lymphocytes Relative: 25.7 % (ref 12.0–46.0)
Lymphs Abs: 2.2 K/uL (ref 0.7–4.0)
MCHC: 34 g/dL (ref 30.0–36.0)
MCV: 88.6 fl (ref 78.0–100.0)
Monocytes Absolute: 0.6 K/uL (ref 0.1–1.0)
Monocytes Relative: 6.5 % (ref 3.0–12.0)
Neutro Abs: 5.5 K/uL (ref 1.4–7.7)
Neutrophils Relative %: 63.7 % (ref 43.0–77.0)
Platelets: 326 K/uL (ref 150.0–400.0)
RBC: 4.24 Mil/uL (ref 3.87–5.11)
RDW: 13.3 % (ref 11.5–15.5)
WBC: 8.6 K/uL (ref 4.0–10.5)
